# Patient Record
Sex: Female | Born: 1955
Health system: Southern US, Community
[De-identification: ages and names within clinical notes are randomized; demographics above are authoritative.]

## PROBLEM LIST (undated history)

## (undated) DIAGNOSIS — Z87442 Personal history of urinary calculi: Secondary | ICD-10-CM

## (undated) DIAGNOSIS — J849 Interstitial pulmonary disease, unspecified: Secondary | ICD-10-CM

## (undated) DIAGNOSIS — E119 Type 2 diabetes mellitus without complications: Secondary | ICD-10-CM

## (undated) DIAGNOSIS — T7840XA Allergy, unspecified, initial encounter: Secondary | ICD-10-CM

## (undated) DIAGNOSIS — N189 Chronic kidney disease, unspecified: Secondary | ICD-10-CM

## (undated) HISTORY — DX: Type 2 diabetes mellitus without complications: E11.9

## (undated) HISTORY — DX: Allergy, unspecified, initial encounter: T78.40XA

## (undated) HISTORY — PX: TUBAL LIGATION: SHX77

## (undated) HISTORY — PX: DILATION AND CURETTAGE OF UTERUS: SHX78

---

## 1998-04-26 ENCOUNTER — Other Ambulatory Visit: Admission: RE | Admit: 1998-04-26 | Discharge: 1998-04-26 | Payer: Self-pay | Admitting: Gynecology

## 1999-05-07 ENCOUNTER — Encounter (INDEPENDENT_AMBULATORY_CARE_PROVIDER_SITE_OTHER): Payer: Self-pay

## 1999-05-07 ENCOUNTER — Other Ambulatory Visit: Admission: RE | Admit: 1999-05-07 | Discharge: 1999-05-07 | Payer: Self-pay | Admitting: Gynecology

## 1999-05-16 ENCOUNTER — Encounter: Admission: RE | Admit: 1999-05-16 | Discharge: 1999-05-16 | Payer: Self-pay | Admitting: Gynecology

## 1999-05-16 ENCOUNTER — Encounter: Payer: Self-pay | Admitting: Gynecology

## 1999-11-07 ENCOUNTER — Encounter (INDEPENDENT_AMBULATORY_CARE_PROVIDER_SITE_OTHER): Payer: Self-pay | Admitting: Specialist

## 1999-11-07 ENCOUNTER — Other Ambulatory Visit: Admission: RE | Admit: 1999-11-07 | Discharge: 1999-11-07 | Payer: Self-pay | Admitting: Gynecology

## 2000-01-05 ENCOUNTER — Encounter (INDEPENDENT_AMBULATORY_CARE_PROVIDER_SITE_OTHER): Payer: Self-pay | Admitting: Specialist

## 2000-01-05 ENCOUNTER — Other Ambulatory Visit: Admission: RE | Admit: 2000-01-05 | Discharge: 2000-01-05 | Payer: Self-pay | Admitting: Gynecology

## 2000-01-23 HISTORY — PX: FOOT SURGERY: SHX648

## 2000-05-16 ENCOUNTER — Encounter: Admission: RE | Admit: 2000-05-16 | Discharge: 2000-05-16 | Payer: Self-pay | Admitting: Gynecology

## 2000-05-16 ENCOUNTER — Encounter: Payer: Self-pay | Admitting: Gynecology

## 2000-05-24 ENCOUNTER — Encounter: Admission: RE | Admit: 2000-05-24 | Discharge: 2000-05-24 | Payer: Self-pay | Admitting: Gynecology

## 2000-05-24 ENCOUNTER — Encounter: Payer: Self-pay | Admitting: Gynecology

## 2001-02-17 ENCOUNTER — Other Ambulatory Visit: Admission: RE | Admit: 2001-02-17 | Discharge: 2001-02-17 | Payer: Self-pay | Admitting: Gynecology

## 2001-05-27 ENCOUNTER — Encounter: Admission: RE | Admit: 2001-05-27 | Discharge: 2001-05-27 | Payer: Self-pay | Admitting: Gynecology

## 2001-05-27 ENCOUNTER — Encounter: Payer: Self-pay | Admitting: Gynecology

## 2002-02-25 ENCOUNTER — Other Ambulatory Visit: Admission: RE | Admit: 2002-02-25 | Discharge: 2002-02-25 | Payer: Self-pay | Admitting: Gynecology

## 2002-06-05 ENCOUNTER — Encounter: Admission: RE | Admit: 2002-06-05 | Discharge: 2002-06-05 | Payer: Self-pay | Admitting: Gynecology

## 2002-06-05 ENCOUNTER — Encounter: Payer: Self-pay | Admitting: Gynecology

## 2003-03-01 ENCOUNTER — Other Ambulatory Visit: Admission: RE | Admit: 2003-03-01 | Discharge: 2003-03-01 | Payer: Self-pay | Admitting: Gynecology

## 2003-06-29 ENCOUNTER — Ambulatory Visit (HOSPITAL_COMMUNITY): Admission: RE | Admit: 2003-06-29 | Discharge: 2003-06-29 | Payer: Self-pay | Admitting: Gynecology

## 2004-03-09 ENCOUNTER — Other Ambulatory Visit: Admission: RE | Admit: 2004-03-09 | Discharge: 2004-03-09 | Payer: Self-pay | Admitting: Gynecology

## 2004-03-31 ENCOUNTER — Ambulatory Visit: Payer: Self-pay | Admitting: Internal Medicine

## 2004-06-30 ENCOUNTER — Ambulatory Visit (HOSPITAL_COMMUNITY): Admission: RE | Admit: 2004-06-30 | Discharge: 2004-06-30 | Payer: Self-pay | Admitting: Gynecology

## 2004-07-17 ENCOUNTER — Ambulatory Visit: Payer: Self-pay | Admitting: Internal Medicine

## 2004-07-21 ENCOUNTER — Ambulatory Visit: Payer: Self-pay | Admitting: Internal Medicine

## 2004-08-14 ENCOUNTER — Ambulatory Visit: Payer: Self-pay | Admitting: Internal Medicine

## 2005-03-13 ENCOUNTER — Other Ambulatory Visit: Admission: RE | Admit: 2005-03-13 | Discharge: 2005-03-13 | Payer: Self-pay | Admitting: Gynecology

## 2005-07-02 ENCOUNTER — Ambulatory Visit (HOSPITAL_COMMUNITY): Admission: RE | Admit: 2005-07-02 | Discharge: 2005-07-02 | Payer: Self-pay | Admitting: Internal Medicine

## 2005-07-17 ENCOUNTER — Ambulatory Visit: Payer: Self-pay | Admitting: Internal Medicine

## 2005-07-23 ENCOUNTER — Ambulatory Visit: Payer: Self-pay | Admitting: Internal Medicine

## 2005-12-03 ENCOUNTER — Ambulatory Visit: Payer: Self-pay | Admitting: Internal Medicine

## 2005-12-03 LAB — CONVERTED CEMR LAB
Glucose, Bld: 136 mg/dL — ABNORMAL HIGH (ref 70–99)
Hgb A1c MFr Bld: 7 % — ABNORMAL HIGH (ref 4.6–6.0)

## 2006-02-20 ENCOUNTER — Ambulatory Visit: Payer: Self-pay | Admitting: Internal Medicine

## 2006-02-27 ENCOUNTER — Other Ambulatory Visit: Admission: RE | Admit: 2006-02-27 | Discharge: 2006-02-27 | Payer: Self-pay | Admitting: Gynecology

## 2006-05-15 ENCOUNTER — Ambulatory Visit: Payer: Self-pay | Admitting: Internal Medicine

## 2006-07-04 ENCOUNTER — Ambulatory Visit (HOSPITAL_COMMUNITY): Admission: RE | Admit: 2006-07-04 | Discharge: 2006-07-04 | Payer: Self-pay | Admitting: Internal Medicine

## 2006-08-13 ENCOUNTER — Ambulatory Visit: Payer: Self-pay | Admitting: Internal Medicine

## 2006-08-13 LAB — CONVERTED CEMR LAB
ALT: 30 units/L (ref 0–35)
AST: 25 units/L (ref 0–37)
Albumin: 3.4 g/dL — ABNORMAL LOW (ref 3.5–5.2)
Alkaline Phosphatase: 72 units/L (ref 39–117)
BUN: 12 mg/dL (ref 6–23)
Basophils Absolute: 0.1 10*3/uL (ref 0.0–0.1)
Basophils Relative: 1.4 % — ABNORMAL HIGH (ref 0.0–1.0)
Bilirubin Urine: NEGATIVE
Bilirubin, Direct: 0.1 mg/dL (ref 0.0–0.3)
CO2: 28 meq/L (ref 19–32)
Calcium: 9.2 mg/dL (ref 8.4–10.5)
Chloride: 105 meq/L (ref 96–112)
Cholesterol: 99 mg/dL (ref 0–200)
Creatinine, Ser: 0.9 mg/dL (ref 0.4–1.2)
Crystals: NEGATIVE
Eosinophils Absolute: 0.3 10*3/uL (ref 0.0–0.6)
Eosinophils Relative: 5.6 % — ABNORMAL HIGH (ref 0.0–5.0)
GFR calc Af Amer: 86 mL/min
GFR calc non Af Amer: 71 mL/min
Glucose, Bld: 126 mg/dL — ABNORMAL HIGH (ref 70–99)
HCT: 36.1 % (ref 36.0–46.0)
HDL: 46.7 mg/dL (ref >39.0)
Hemoglobin: 12.2 g/dL (ref 12.0–15.0)
Ketones, ur: NEGATIVE mg/dL
LDL Cholesterol: 40 mg/dL (ref 0–99)
Leukocytes, UA: NEGATIVE
Lymphocytes Relative: 43.2 % (ref 12.0–46.0)
MCHC: 33.9 g/dL (ref 30.0–36.0)
MCV: 85.7 fL (ref 78.0–100.0)
Monocytes Absolute: 0.4 10*3/uL (ref 0.2–0.7)
Monocytes Relative: 6.3 % (ref 3.0–11.0)
Mucus, UA: NEGATIVE
Neutro Abs: 2.6 10*3/uL (ref 1.4–7.7)
Neutrophils Relative %: 43.5 % (ref 43.0–77.0)
Nitrite: NEGATIVE
Platelets: 106 10*3/uL — ABNORMAL LOW (ref 150–400)
Potassium: 3.7 meq/L (ref 3.5–5.1)
RBC: 4.21 M/uL (ref 3.87–5.11)
RDW: 13.2 % (ref 11.5–14.6)
Sodium: 138 meq/L (ref 135–145)
Specific Gravity, Urine: >=1.03 (ref 1.000–1.03)
TSH: 3.42 microintl units/mL (ref 0.35–5.50)
Total Bilirubin: 0.5 mg/dL (ref 0.3–1.2)
Total CHOL/HDL Ratio: 2.1
Total Protein, Urine: NEGATIVE mg/dL
Total Protein: 7.5 g/dL (ref 6.0–8.3)
Triglycerides: 60 mg/dL (ref 0–149)
Urine Glucose: NEGATIVE mg/dL
Urobilinogen, UA: 0.2 (ref 0.0–1.0)
VLDL: 12 mg/dL (ref 0–40)
WBC: 6 10*3/uL (ref 4.5–10.5)
pH: 5.5 (ref 5.0–8.0)

## 2006-08-20 ENCOUNTER — Ambulatory Visit: Payer: Self-pay | Admitting: Internal Medicine

## 2007-01-27 ENCOUNTER — Telehealth (INDEPENDENT_AMBULATORY_CARE_PROVIDER_SITE_OTHER): Payer: Self-pay | Admitting: *Deleted

## 2007-02-19 ENCOUNTER — Telehealth (INDEPENDENT_AMBULATORY_CARE_PROVIDER_SITE_OTHER): Payer: Self-pay | Admitting: *Deleted

## 2007-02-19 ENCOUNTER — Ambulatory Visit: Payer: Self-pay | Admitting: Internal Medicine

## 2007-02-23 LAB — CONVERTED CEMR LAB

## 2007-03-10 ENCOUNTER — Other Ambulatory Visit: Admission: RE | Admit: 2007-03-10 | Discharge: 2007-03-10 | Payer: Self-pay | Admitting: Gynecology

## 2007-04-03 ENCOUNTER — Encounter: Payer: Self-pay | Admitting: *Deleted

## 2007-04-03 DIAGNOSIS — E118 Type 2 diabetes mellitus with unspecified complications: Secondary | ICD-10-CM | POA: Insufficient documentation

## 2007-04-03 DIAGNOSIS — Z9189 Other specified personal risk factors, not elsewhere classified: Secondary | ICD-10-CM | POA: Insufficient documentation

## 2007-07-07 ENCOUNTER — Ambulatory Visit (HOSPITAL_COMMUNITY): Admission: RE | Admit: 2007-07-07 | Discharge: 2007-07-07 | Payer: Self-pay | Admitting: Internal Medicine

## 2007-08-13 ENCOUNTER — Ambulatory Visit: Payer: Self-pay | Admitting: Internal Medicine

## 2007-08-13 LAB — CONVERTED CEMR LAB
ALT: 34 units/L (ref 0–35)
AST: 29 units/L (ref 0–37)
Albumin: 3.6 g/dL (ref 3.5–5.2)
Alkaline Phosphatase: 66 units/L (ref 39–117)
BUN: 15 mg/dL (ref 6–23)
Basophils Absolute: 0.1 10*3/uL (ref 0.0–0.1)
Basophils Relative: 1.1 % (ref 0.0–3.0)
Bilirubin Urine: NEGATIVE
Bilirubin, Direct: 0.1 mg/dL (ref 0.0–0.3)
CO2: 28 meq/L (ref 19–32)
Calcium: 9.4 mg/dL (ref 8.4–10.5)
Chloride: 105 meq/L (ref 96–112)
Cholesterol: 95 mg/dL (ref 0–200)
Creatinine, Ser: 0.9 mg/dL (ref 0.4–1.2)
Crystals: NEGATIVE
Eosinophils Absolute: 0.4 10*3/uL (ref 0.0–0.7)
Eosinophils Relative: 6.1 % — ABNORMAL HIGH (ref 0.0–5.0)
GFR calc Af Amer: 86 mL/min
GFR calc non Af Amer: 71 mL/min
Glucose, Bld: 97 mg/dL (ref 70–99)
HCT: 36.2 % (ref 36.0–46.0)
HDL: 51.2 mg/dL (ref >39.0)
Hemoglobin: 12.1 g/dL (ref 12.0–15.0)
Hgb A1c MFr Bld: 7.6 % — ABNORMAL HIGH (ref 4.6–6.0)
Ketones, ur: NEGATIVE mg/dL
LDL Cholesterol: 32 mg/dL (ref 0–99)
Leukocytes, UA: NEGATIVE
Lymphocytes Relative: 49.4 % — ABNORMAL HIGH (ref 12.0–46.0)
MCHC: 33.4 g/dL (ref 30.0–36.0)
MCV: 87.2 fL (ref 78.0–100.0)
Monocytes Absolute: 0.5 10*3/uL (ref 0.1–1.0)
Monocytes Relative: 7.3 % (ref 3.0–12.0)
Mucus, UA: NEGATIVE
Neutro Abs: 2.5 10*3/uL (ref 1.4–7.7)
Neutrophils Relative %: 36.1 % — ABNORMAL LOW (ref 43.0–77.0)
Nitrite: NEGATIVE
Platelets: 203 10*3/uL (ref 150–400)
Potassium: 3.8 meq/L (ref 3.5–5.1)
RBC: 4.15 M/uL (ref 3.87–5.11)
RDW: 13.4 % (ref 11.5–14.6)
Sodium: 145 meq/L (ref 135–145)
Specific Gravity, Urine: 1.025 (ref 1.000–1.03)
TSH: 2.35 microintl units/mL (ref 0.35–5.50)
Total Bilirubin: 0.6 mg/dL (ref 0.3–1.2)
Total CHOL/HDL Ratio: 1.9
Total Protein, Urine: NEGATIVE mg/dL
Total Protein: 8.5 g/dL — ABNORMAL HIGH (ref 6.0–8.3)
Triglycerides: 57 mg/dL (ref 0–149)
Urine Glucose: NEGATIVE mg/dL
Urobilinogen, UA: 0.2 (ref 0.0–1.0)
VLDL: 11 mg/dL (ref 0–40)
WBC: 6.8 10*3/uL (ref 4.5–10.5)
pH: 5.5 (ref 5.0–8.0)

## 2007-08-21 ENCOUNTER — Ambulatory Visit: Payer: Self-pay | Admitting: Internal Medicine

## 2007-08-22 ENCOUNTER — Telehealth: Payer: Self-pay | Admitting: Internal Medicine

## 2007-09-04 ENCOUNTER — Telehealth: Payer: Self-pay | Admitting: Internal Medicine

## 2007-09-10 ENCOUNTER — Telehealth: Payer: Self-pay | Admitting: Internal Medicine

## 2007-09-11 ENCOUNTER — Telehealth: Payer: Self-pay | Admitting: Internal Medicine

## 2008-01-23 HISTORY — PX: COLONOSCOPY: SHX174

## 2008-02-09 ENCOUNTER — Ambulatory Visit: Payer: Self-pay | Admitting: Cardiology

## 2008-02-09 ENCOUNTER — Ambulatory Visit: Payer: Self-pay | Admitting: Internal Medicine

## 2008-02-09 ENCOUNTER — Other Ambulatory Visit: Admission: RE | Admit: 2008-02-09 | Discharge: 2008-02-09 | Payer: Self-pay | Admitting: Gynecology

## 2008-02-09 ENCOUNTER — Telehealth: Payer: Self-pay | Admitting: Internal Medicine

## 2008-02-09 LAB — CONVERTED CEMR LAB
Bilirubin Urine: NEGATIVE
Glucose, Urine, Semiquant: NEGATIVE
Ketones, urine, test strip: NEGATIVE
Nitrite: NEGATIVE
Protein, U semiquant: NEGATIVE
Specific Gravity, Urine: >=1.03
Urobilinogen, UA: 0.2
WBC Urine, dipstick: NEGATIVE
pH: 6

## 2008-02-10 ENCOUNTER — Telehealth: Payer: Self-pay | Admitting: Internal Medicine

## 2008-03-24 ENCOUNTER — Encounter: Payer: Self-pay | Admitting: Internal Medicine

## 2008-07-08 ENCOUNTER — Ambulatory Visit (HOSPITAL_COMMUNITY): Admission: RE | Admit: 2008-07-08 | Discharge: 2008-07-08 | Payer: Self-pay | Admitting: Gynecology

## 2008-08-09 ENCOUNTER — Ambulatory Visit: Payer: Self-pay | Admitting: Internal Medicine

## 2008-08-09 LAB — CONVERTED CEMR LAB
ALT: 29 units/L (ref 0–35)
AST: 23 units/L (ref 0–37)
Albumin: 3.3 g/dL — ABNORMAL LOW (ref 3.5–5.2)
Alkaline Phosphatase: 80 units/L (ref 39–117)
BUN: 15 mg/dL (ref 6–23)
Basophils Absolute: 0 10*3/uL (ref 0.0–0.1)
Basophils Relative: 0.2 % (ref 0.0–3.0)
Bilirubin Urine: NEGATIVE
Bilirubin, Direct: 0.1 mg/dL (ref 0.0–0.3)
CO2: 32 meq/L (ref 19–32)
Calcium: 9.5 mg/dL (ref 8.4–10.5)
Chloride: 105 meq/L (ref 96–112)
Cholesterol: 95 mg/dL (ref 0–200)
Creatinine, Ser: 0.9 mg/dL (ref 0.4–1.2)
Eosinophils Absolute: 0.4 10*3/uL (ref 0.0–0.7)
Eosinophils Relative: 6.4 % — ABNORMAL HIGH (ref 0.0–5.0)
GFR calc non Af Amer: 85.01 mL/min (ref >60)
Glucose, Bld: 113 mg/dL — ABNORMAL HIGH (ref 70–99)
HCT: 36.2 % (ref 36.0–46.0)
HDL: 56 mg/dL (ref >39.00)
Hemoglobin: 12 g/dL (ref 12.0–15.0)
Hgb A1c MFr Bld: 7.1 % — ABNORMAL HIGH (ref 4.6–6.5)
Ketones, ur: NEGATIVE mg/dL
LDL Cholesterol: 30 mg/dL (ref 0–99)
Leukocytes, UA: NEGATIVE
Lymphocytes Relative: 41.3 % (ref 12.0–46.0)
Lymphs Abs: 2.4 10*3/uL (ref 0.7–4.0)
MCHC: 33.2 g/dL (ref 30.0–36.0)
MCV: 85.1 fL (ref 78.0–100.0)
Monocytes Absolute: 0.4 10*3/uL (ref 0.1–1.0)
Monocytes Relative: 6.3 % (ref 3.0–12.0)
Neutro Abs: 2.6 10*3/uL (ref 1.4–7.7)
Neutrophils Relative %: 45.8 % (ref 43.0–77.0)
Nitrite: NEGATIVE
Platelets: 197 10*3/uL (ref 150.0–400.0)
Potassium: 4.3 meq/L (ref 3.5–5.1)
RBC: 4.26 M/uL (ref 3.87–5.11)
RDW: 13.8 % (ref 11.5–14.6)
Sodium: 143 meq/L (ref 135–145)
Specific Gravity, Urine: 1.015 (ref 1.000–1.030)
TSH: 2.37 microintl units/mL (ref 0.35–5.50)
Total Bilirubin: 0.5 mg/dL (ref 0.3–1.2)
Total CHOL/HDL Ratio: 2
Total Protein, Urine: NEGATIVE mg/dL
Total Protein: 8 g/dL (ref 6.0–8.3)
Triglycerides: 44 mg/dL (ref 0.0–149.0)
Urine Glucose: NEGATIVE mg/dL
Urobilinogen, UA: 0.2 (ref 0.0–1.0)
VLDL: 8.8 mg/dL (ref 0.0–40.0)
WBC: 5.8 10*3/uL (ref 4.5–10.5)
pH: 5.5 (ref 5.0–8.0)

## 2008-08-26 ENCOUNTER — Ambulatory Visit: Payer: Self-pay | Admitting: Internal Medicine

## 2008-12-03 ENCOUNTER — Telehealth: Payer: Self-pay | Admitting: Internal Medicine

## 2009-02-22 ENCOUNTER — Encounter: Payer: Self-pay | Admitting: Internal Medicine

## 2009-06-22 LAB — CONVERTED CEMR LAB: Pap Smear: NORMAL

## 2009-09-05 ENCOUNTER — Ambulatory Visit: Payer: Self-pay | Admitting: Internal Medicine

## 2009-09-05 LAB — CONVERTED CEMR LAB
ALT: 32 units/L (ref 0–35)
AST: 27 units/L (ref 0–37)
Albumin: 3.5 g/dL (ref 3.5–5.2)
Alkaline Phosphatase: 83 units/L (ref 39–117)
BUN: 16 mg/dL (ref 6–23)
Basophils Absolute: 0 10*3/uL (ref 0.0–0.1)
Basophils Relative: 0.6 % (ref 0.0–3.0)
Bilirubin Urine: NEGATIVE
Bilirubin, Direct: 0.1 mg/dL (ref 0.0–0.3)
CO2: 30 meq/L (ref 19–32)
Calcium: 9.5 mg/dL (ref 8.4–10.5)
Chloride: 107 meq/L (ref 96–112)
Cholesterol: 103 mg/dL (ref 0–200)
Creatinine, Ser: 1 mg/dL (ref 0.4–1.2)
Eosinophils Absolute: 0.4 10*3/uL (ref 0.0–0.7)
Eosinophils Relative: 6.9 % — ABNORMAL HIGH (ref 0.0–5.0)
GFR calc non Af Amer: 74.95 mL/min (ref >60)
Glucose, Bld: 113 mg/dL — ABNORMAL HIGH (ref 70–99)
HCT: 37 % (ref 36.0–46.0)
HDL: 54.4 mg/dL (ref >39.00)
Hemoglobin: 12.2 g/dL (ref 12.0–15.0)
Ketones, ur: NEGATIVE mg/dL
LDL Cholesterol: 38 mg/dL (ref 0–99)
Leukocytes, UA: NEGATIVE
Lymphocytes Relative: 46.6 % — ABNORMAL HIGH (ref 12.0–46.0)
Lymphs Abs: 3 10*3/uL (ref 0.7–4.0)
MCHC: 33 g/dL (ref 30.0–36.0)
MCV: 87.2 fL (ref 78.0–100.0)
Monocytes Absolute: 0.5 10*3/uL (ref 0.1–1.0)
Monocytes Relative: 8.1 % (ref 3.0–12.0)
Neutro Abs: 2.5 10*3/uL (ref 1.4–7.7)
Neutrophils Relative %: 37.8 % — ABNORMAL LOW (ref 43.0–77.0)
Nitrite: NEGATIVE
Platelets: 214 10*3/uL (ref 150.0–400.0)
Potassium: 4.5 meq/L (ref 3.5–5.1)
RBC: 4.24 M/uL (ref 3.87–5.11)
RDW: 14.6 % (ref 11.5–14.6)
Sodium: 143 meq/L (ref 135–145)
Specific Gravity, Urine: 1.025 (ref 1.000–1.030)
TSH: 2.21 microintl units/mL (ref 0.35–5.50)
Total Bilirubin: 0.3 mg/dL (ref 0.3–1.2)
Total CHOL/HDL Ratio: 2
Total Protein, Urine: NEGATIVE mg/dL
Total Protein: 7.4 g/dL (ref 6.0–8.3)
Triglycerides: 55 mg/dL (ref 0.0–149.0)
Urine Glucose: NEGATIVE mg/dL
Urobilinogen, UA: 0.2 (ref 0.0–1.0)
VLDL: 11 mg/dL (ref 0.0–40.0)
WBC: 6.5 10*3/uL (ref 4.5–10.5)
pH: 5.5 (ref 5.0–8.0)

## 2009-09-16 ENCOUNTER — Encounter: Payer: Self-pay | Admitting: Internal Medicine

## 2009-09-16 ENCOUNTER — Ambulatory Visit: Payer: Self-pay | Admitting: Internal Medicine

## 2009-09-16 LAB — CONVERTED CEMR LAB: Hgb A1c MFr Bld: 7.1 % — ABNORMAL HIGH (ref 4.6–6.5)

## 2009-11-16 ENCOUNTER — Encounter (INDEPENDENT_AMBULATORY_CARE_PROVIDER_SITE_OTHER): Payer: Self-pay | Admitting: *Deleted

## 2009-12-23 ENCOUNTER — Encounter (INDEPENDENT_AMBULATORY_CARE_PROVIDER_SITE_OTHER): Payer: Self-pay | Admitting: *Deleted

## 2009-12-26 ENCOUNTER — Ambulatory Visit: Payer: Self-pay | Admitting: Gastroenterology

## 2010-01-11 ENCOUNTER — Ambulatory Visit: Payer: Self-pay | Admitting: Gastroenterology

## 2010-02-21 NOTE — Progress Notes (Signed)
Summary: Januvia request  Phone Note Call from Patient Call back at Work Phone [phone] Call back at [phone] cell   Caller: Patient Summary of Call: Patient called stating that her glimeperide was D/C (per 09/04/07 phone note). SHe is now requesting a new rx for Januvia that will help curve her appitite Initial call taken by: Rock Nephew CMA,  September 10, 2007 10:03 AM  Follow-up for Phone Call        OK for januvia 100mg  1 by mouth once daily, # 30. If we have samples she can have a sample for trial. Rx e-sent to her pharmacy Follow-up by: Jacques Navy MD,  September 11, 2007 8:36 AM  Additional Follow-up for Phone Call Additional follow up Details #1::        Patient notified and will pick up samples Additional Follow-up by: Rock Nephew CMA,  September 11, 2007 11:15 AM    New/Updated Medications: JANUVIA 100 MG  TABS (SITAGLIPTIN PHOSPHATE) 1 by mouth once daily   Prescriptions: JANUVIA 100 MG  TABS (SITAGLIPTIN PHOSPHATE) 1 by mouth once daily  #30 x 12   Entered and Authorized by:   Jacques Navy MD   Signed by:   Jacques Navy MD on 09/11/2007   Method used:   Electronically sent to ...       [facility] #[id number]*       [address]       [place], [place]  [address]       Ph: [phone]       Fax: [phone]   RxID:   [id number]

## 2010-02-21 NOTE — Letter (Signed)
Summary: Pre Visit Letter Revised  [facility]  [address] [place], [place] [address]   Phone: [phone]  Fax: [phone]        11/16/2009 MRN: [id number] Angel Gordon [address] [place], [place]  [address]             Procedure Date:  01/11/2010   Welcome to the Gastroenterology Division at [facility].    You are scheduled to see a nurse for your pre-procedure visit on 12/26/2009 at 8:00AM on the 3rd floor at [facility], [address]  We ask that you try to arrive at our office 15 minutes prior to your appointment time to allow for check-in.  Please take a minute to review the attached form.  If you answer "Yes" to one or more of the questions on the first page, we ask that you call the person listed at your earliest opportunity.  If you answer "No" to all of the questions, please complete the rest of the form and bring it to your appointment.    Your nurse visit will consist of discussing your medical and surgical history, your immediate family medical history, and your medications.   If you are unable to list all of your medications on the form, please bring the medication bottles to your appointment and we will list them.  We will need to be aware of both prescribed and over the counter drugs.  We will need to know exact dosage information as well.    Please be prepared to read and sign documents such as consent forms, a financial agreement, and acknowledgement forms.  If necessary, and with your consent, a friend or relative is welcome to sit-in on the nurse visit with you.  Please bring your insurance card so that we may make a copy of it.  If your insurance requires a referral to see a specialist, please bring your referral form from your primary care physician.  No co-pay is required for this nurse visit.     If you cannot keep your appointment, please call [phone] to cancel or reschedule prior to your appointment date.  This  allows [place] the opportunity to schedule an appointment for another patient in need of care.    Thank you for choosing [facility] for your medical needs.  We appreciate the opportunity to care for you.  Please visit [place] at our website  to learn more about our practice.  Sincerely, The Gastroenterology Division

## 2010-02-21 NOTE — Progress Notes (Signed)
Summary: Low Cbg's again  Phone Note Call from Patient   Summary of Call: Pt taking med every other day as advised on last phone note. Pt concerned b/c cbgs are still dropping to 50's. Initial call taken by: Lamar Sprinkles,  September 04, 2007 2:35 PM  Follow-up for Phone Call        left msg with SO: stop glimeperide. Follow-up by: Jacques Navy MD,  September 04, 2007 5:42 PM

## 2010-02-21 NOTE — Assessment & Plan Note (Signed)
Summary: CPX/aetna/cd   Vital Signs:  Patient profile:   55 year old female Height:      65 inches Weight:      185.75 pounds BMI:     31.02 O2 Sat:      98 % on Room air Temp:     97.8 degrees F oral Pulse rate:   86 / minute BP sitting:   110 / 84  (right arm) Cuff size:   regular  Vitals Entered By: christy freeman-student  O2 Flow:  Room air CC: physical Is Patient Diabetic? Yes  Pain Assessment Patient in pain? no        Primary Care Provider:  Corrinna Karapetyan  CC:  physical.  History of Present Illness: Patient presents for medical follow-up. She reports that she has GI side effects and metallic taste and fatigue associated with metformin.   She had gyn exam in January - Dr. Chevis Pretty; mammography in July. Eye exam - March '10 Dr. Sherryle Lis - normal.  She is otherwise feeling well.   Current Medications (verified): 1)  Metformin Hcl 500 Mg Tabs (Metformin Hcl) .Marland Kitchen.. 1 By Mouth Two Times A Day 2)  Onetouch Ultra Test  Strp (Glucose Blood) .... Check Blood Sugar Levels Two Times A Day  Allergies (verified): No Known Drug Allergies  Past History:  Past Medical History: DIABETES MELLITUS (ICD-250.00)     Past Surgical History: Reviewed history from 04/03/2007 and no changes required. FOOT SURGERY, HX OF (ICD-V15.89) Hx of TUBAL LIGATION, HX OF (ICD-V26.51)  Family History: Reviewed history from 08/21/2007 and no changes required. father -deceased @33 : MVA mother - 50: HTN, DM, ESRD s/p transplant, lymphoma, CVA, thyroid Neg- breast cancer MGM - colon cancer  Social History: Reviewed history from 08/21/2007 and no changes required. [employer], [employer] - classes married '78 - 3 years, divorced; married '96 1 son ' '79; 3 grandchildren work: [place]  Review of Systems  The patient denies anorexia, fever, weight loss, weight gain, vision loss, hoarseness, chest pain, peripheral edema, abdominal pain, severe indigestion/heartburn, incontinence, depression, and enlarged  lymph nodes.         Having urinary urgency.  Physical Exam  General:  overweight AA female in no distress Head:  Normocephalic and atraumatic without obvious abnormalities. No apparent alopecia or balding. Eyes:  C&S clear  Ears:  External ear exam shows no significant lesions or deformities.  Otoscopic examination reveals clear canals, tympanic membranes are intact bilaterally without bulging, retraction, inflammation or discharge. Hearing is grossly normal bilaterally. Nose:  no external deformity and no external erythema.   Mouth:  Oral mucosa and oropharynx without lesions or exudates.  Teeth in good repair. Neck:  No deformities, masses, or tenderness noted.no thyromegaly.   Breasts:  deferred to gyn  Lungs:  Normal respiratory effort, chest expands symmetrically. Lungs are clear to auscultation, no crackles or wheezes. Heart:  Normal rate and regular rhythm. S1 and S2 normal without gallop, murmur, click, rub or other extra sounds. Abdomen:  soft, non-tender, and normal bowel sounds.   Genitalia:  deferred to gyn  Msk:  normal ROM, no joint tenderness, no joint swelling, and no joint warmth.   Pulses:  2+ radial  Neurologic:  alert & oriented X3, cranial nerves II-XII intact, strength normal in all extremities, and gait normal.   Skin:  turgor normal, color normal, no ulcerations, and no edema.   Cervical Nodes:  no anterior cervical adenopathy and no posterior cervical adenopathy.   Psych:  Oriented X3, memory intact  for recent and remote, normally interactive, and good eye contact.    Diabetes Management Exam:    Eye Exam:       Eye Exam done elsewhere          Date: 03/31/2008          Results: normal          Done by: Dr. Sherryle Lis   Impression & Recommendations:  Problem # 1:  DIABETES MELLITUS (ICD-250.00)  Her updated medication list for this problem includes:    Metformin Hcl 500 Mg Tabs (Metformin hcl) .Marland Kitchen... 1 by mouth two times a day  Labs Reviewed: Creat: 0.9  (08/09/2008)     Last Eye Exam: normal (03/31/2008) Reviewed HgBA1c results: 7.1 (08/09/2008)  7.6 (08/13/2007)  Good control. Patient is having side affects. We reviewed alternative meds and costs. At this time we will continue metformin  Problem # 2:  Preventive Health Care (ICD-V70.0) current with gyn, mammography and with opthalmology. Her exam is normal. labs are good.  In summargy: a very nice woman with well controlled diabetes. She will return as needed.   Complete Medication List: 1)  Metformin Hcl 500 Mg Tabs (Metformin hcl) .Marland Kitchen.. 1 by mouth two times a day 2)  Onetouch Ultra Test Strp (Glucose blood) .... Check blood sugar levels two times a day    Tests: (1) Lipid Panel (LIPID)   Cholesterol               95 mg/dL                    9-629     ATP III Classification            Desirable:  < 200 mg/dL                    Borderline High:  200 - 239 mg/dL               High:  > = 240 mg/dL   Triglycerides             44.0 mg/dL                  5.2-841.3     Normal:  <150 mg/dL     Borderline High:  244 - 199 mg/dL   HDL                       01.02 mg/dL                 >72.53   VLDL Cholesterol          8.8 mg/dL                   6.6-44.0   LDL Cholesterol           30 mg/dL                    3-47  CHO/HDL Ratio:  CHD Risk                             2                    Men          Women     1/2 Average Risk     3.4          3.3  Average Risk          5.0          4.4     2X Average Risk          9.6          7.1     3X Average Risk          15.0          11.0                           Tests: (2) BMP (METABOL)   Sodium                    143 mEq/L                   135-145   Potassium                 4.3 mEq/L                   3.5-5.1   Chloride                  105 mEq/L                   96-112   Carbon Dioxide            32 mEq/L                    19-32   Glucose              [H]  113 mg/dL                   60-45   BUN                       15 mg/dL                     4-09   Creatinine                0.9 mg/dL                   8.1-1.9   Calcium                   9.5 mg/dL                   1.4-78.2   GFR                       85.01 mL/min                >60  Tests: (3) CBC Platelet w/Diff (CBCD)   White Cell Count          5.8 K/uL                    4.5-10.5   Red Cell Count            4.26 Mil/uL                 3.87-5.11   Hemoglobin                12.0 g/dL                   95.6-21.3   Hematocrit  36.2 %                      36.0-46.0   MCV                       85.1 fl                     78.0-100.0   MCHC                      33.2 g/dL                   65.7-84.6   RDW                       13.8 %                      11.5-14.6   Platelet Count            197.0 K/uL                  150.0-400.0   Neutrophil %              45.8 %                      43.0-77.0   Lymphocyte %              41.3 %                      12.0-46.0   Monocyte %                6.3 %                       3.0-12.0   Eosinophils%         [H]  6.4 %                       0.0-5.0   Basophils %               0.2 %                       0.0-3.0   Neutrophill Absolute      2.6 K/uL                    1.4-7.7   Lymphocyte Absolute       2.4 K/uL                    0.7-4.0   Monocyte Absolute         0.4 K/uL                    0.1-1.0  Eosinophils, Absolute                             0.4 K/uL                    0.0-0.7   Basophils Absolute        0.0 K/uL                    0.0-0.1  Tests: (4) Hepatic/Liver Function Panel (HEPATIC)  Total Bilirubin           0.5 mg/dL                   1.6-1.0   Direct Bilirubin          0.1 mg/dL                   9.6-0.4   Alkaline Phosphatase      80 U/L                      39-117   AST                       23 U/L                      0-37   ALT                       29 U/L                      0-35   Total Protein             8.0 g/dL                    5.4-0.9   Albumin              [L]  3.3 g/dL                     8.1-1.9  Tests: (5) TSH (TSH)   FastTSH                   2.37 uIU/mL                 0.35-5.50  Tests: (6) UDip w/Micro (URINE)   Color                     LT. YELLOW       RANGE:  Yellow;Lt. Yellow   Clarity                   CLEAR                       Clear   Specific Gravity          1.015                       1.000 - 1.030   Urine Ph                  5.5                         5.0-8.0   Protein                   NEGATIVE                    Negative   Urine Glucose             NEGATIVE                    Negative   Ketones                   NEGATIVE  Negative   Urine Bilirubin           NEGATIVE                    Negative   Blood                     SMALL                       Negative   Urobilinogen              0.2                         0.0 - 1.0   Leukocyte Esterace        NEGATIVE                    Negative   Nitrite                   NEGATIVE                    Negative   Urine WBC                 0-2/hpf                     0-2/hpf   Urine RBC                 3-6/hpf                     0-2/hpf   Urine Epith               Few(5-10/hpf)               Rare(0-4/hpf)   Urine Bacteria            Few(10-50/hpf)              None  Tests: (7) Hemoglobin A1C (A1C)   Hemoglobin A1C       [H]  7.1 %                       4.6-6.5     Glycemic Control Guidelines for People with Diabetes:     Non Diabetic:  <6%     Goal of Therapy: <7%     Additional Action Suggested:  >8%

## 2010-02-21 NOTE — Progress Notes (Signed)
Summary: PATCHES  Phone Note Call from Patient   Summary of Call: Patient is requesting rx for motion sickness patches for cruise.  Initial call taken by: Lamar Sprinkles, CMA,  December 03, 2008 2:25 PM  Follow-up for Phone Call        OK for scopolamine patches box of 3: apply q 72 hours Follow-up by: Jacques Navy MD,  December 03, 2008 3:04 PM  Additional Follow-up for Phone Call Additional follow up Details #1::        Pt informed  Additional Follow-up by: Lamar Sprinkles, CMA,  December 06, 2008 9:10 AM    New/Updated Medications: TRANSDERM-SCOP 1.5 MG PT72 (SCOPOLAMINE BASE) 1 every 72 hours Prescriptions: TRANSDERM-SCOP 1.5 MG PT72 (SCOPOLAMINE BASE) 1 every 72 hours  #3 x 0   Entered by:   Lamar Sprinkles, CMA   Authorized by:   Jacques Navy MD   Signed by:   Lamar Sprinkles, CMA on 12/06/2008   Method used:   Electronically to        [facility] #[id number]* (retail)       [address] [place], [place]  [address]       Ph: [phone]       Fax: [phone]   RxID:   [id number]

## 2010-02-21 NOTE — Progress Notes (Signed)
Summary: New Med  Phone Note Call from Patient   Caller: [phone] Summary of Call: Pt took new med 1/2 tab yesterday & today. Says bp today was 64.  Initial call taken by: Lamar Sprinkles,  August 22, 2007 2:43 PM  Follow-up for Phone Call        Spoke w/pt, she ate at 2:15 and checked cbg at 3:13, was, 215. Says feels somewhat better, but just "doesn't feel right" Follow-up by: Lamar Sprinkles,  August 22, 2007 3:14 PM  Additional Follow-up for Phone Call Additional follow up Details #1::        can take every other day for now. eat regularly scheduled meals and a bedtime snack. Additional Follow-up by: Jacques Navy MD,  August 22, 2007 6:33 PM    Additional Follow-up for Phone Call Additional follow up Details #2::    Lf mess for pt Follow-up by: Lamar Sprinkles,  August 22, 2007 6:41 PM  Additional Follow-up for Phone Call Additional follow up Details #3:: Details for Additional Follow-up Action Taken: Pt did recieve mess Additional Follow-up by: Lamar Sprinkles,  August 25, 2007 4:30 PM

## 2010-02-21 NOTE — Assessment & Plan Note (Signed)
Summary: CONGESTION/PER FLAG FROM TRIAGE/NWS   Vital Signs:  Patient Profile:   55 Years Old Female Height:     65 inches Weight:      190 pounds Temp:     97.0 degrees F oral Pulse rate:   81 / minute BP sitting:   133 / 85  (left arm) Cuff size:   large  Vitals Entered By: Zackery Barefoot CMA (February 19, 2007 3:37 PM)                 PCP:  Najae Rathert  Chief Complaint:  Cold & URI symptoms/Cough.  History of Present Illness: Since Friday, congestion, feverish, sweats, coughing but not productive. Not SOB.        Physical Exam  General:     alert, well-developed, well-nourished, well-hydrated, appropriate dress, and overweight-appearing.   Head:     normocephalic, atraumatic, no abnormalities observed, and no abnormalities palpated.   Eyes:     pupils equal and pupils round.   Ears:     External ear exam shows no significant lesions or deformities.  Otoscopic examination reveals clear canals, tympanic membranes are intact bilaterally without bulging, retraction, inflammation or discharge. Hearing is grossly normal bilaterally. Mouth:     post-pharynx hard to see. Qestion-erythema Lungs:     Normal respiratory effort, chest expands symmetrically. Lungs are clear to auscultation, no crackles or wheezes. Heart:     Normal rate and regular rhythm. S1 and S2 normal without gallop, murmur, click, rub or other extra sounds.    Impression & Recommendations:  Problem # 1:  URI (ICD-465.9) typical symptoms: viral vs bacterial.  Plan: z-pak, phen/cod, claritin-D Her updated medication list for this problem includes:    Promethazine-codeine 6.25-10 Mg/57ml Syrp (Promethazine-codeine) .Marland Kitchen... 1 tsp q 6 as needed cough   Complete Medication List: 1)  Azithromycin 250 Mg Tabs (Azithromycin) .... 2 tabs day #1, 1 tab days #2-5 2)  Promethazine-codeine 6.25-10 Mg/34ml Syrp (Promethazine-codeine) .Marland Kitchen.. 1 tsp q 6 as needed cough   Patient Instructions: 1)  Upper respiratory  infection: 2)  generic Z-pak, phenergan with codeine cough syrup 1 tsp every 6 hours as needed, claritin D or equivalent.    Prescriptions: PROMETHAZINE-CODEINE 6.25-10 MG/5ML  SYRP (PROMETHAZINE-CODEINE) 1 tsp q 6 as needed cough  #8 oz x 1   Entered and Authorized by:   Jacques Navy MD   Signed by:   Jacques Navy MD on 02/19/2007   Method used:   Print then Give to Patient   RxID:   [id number] AZITHROMYCIN 250 MG  TABS (AZITHROMYCIN) 2 tabs day #1, 1 tab days #2-5  #6 x 0   Entered and Authorized by:   Jacques Navy MD   Signed by:   Jacques Navy MD on 02/19/2007   Method used:   Print then Give to Patient   RxID:   [id number]  ]

## 2010-02-21 NOTE — Miscellaneous (Signed)
Summary: previsit prep/rm  Clinical Lists Changes  Medications: Added new medication of MOVIPREP 100 GM  SOLR (PEG-KCL-NACL-NASULF-NA ASC-C) As per prep instructions. - Signed Rx of MOVIPREP 100 GM  SOLR (PEG-KCL-NACL-NASULF-NA ASC-C) As per prep instructions.;  #1 x 0;  Signed;  Entered by: Sherren Kerns RN;  Authorized by: Louis Meckel MD;  Method used: Electronically to [facility] #[id number]*, [address] [place], [place]  [address], Ph: [phone], Fax: [phone] Observations: Added new observation of ALLERGY REV: Done (12/26/2009 7:43)    Prescriptions: MOVIPREP 100 GM  SOLR (PEG-KCL-NACL-NASULF-NA ASC-C) As per prep instructions.  #1 x 0   Entered by:   Sherren Kerns RN   Authorized by:   Louis Meckel MD   Signed by:   Sherren Kerns RN on 12/26/2009   Method used:   Electronically to        [facility] #[id number]* (retail)       [address] [place], [place]  [address]       Ph: [phone]       Fax: [phone]   RxID:   [id number]

## 2010-02-21 NOTE — Progress Notes (Signed)
Summary: Evidence  Phone Note From Other Clinic Call back at 258   Caller: Rose from [place] Summary of Call: Rose from [place] wants to know what evidence supports Dr. Debby Bud knowing pt has a stone. Requesting same to give to Radiologist. Initial call taken by: Zackery Barefoot CMA,  February 09, 2008 3:03 PM  Follow-up for Phone Call        called rose and gave information Follow-up by: Jacques Navy MD,  February 10, 2008 5:19 AM

## 2010-02-21 NOTE — Assessment & Plan Note (Signed)
Summary: left thigh pain/$50/cd   Vital Signs:  Patient Profile:   55 Years Old Female Height:     65 inches Weight:      186 pounds Temp:     97.7 degrees F oral Pulse rate:   68 / minute BP sitting:   122 / 84  (left arm) Cuff size:   regular  Vitals Entered By: Zackery Barefoot CMA (February 09, 2008 1:20 PM)                 PCP:  Tag Wurtz  Chief Complaint:  Back Pain/urinary urgency and frequency.  History of Present Illness: Patient reeported on-set of severe pain left back which has migrated to the left lower abdomen to the left lower abdominal region. She did see her  gyn today and has had blood drawn to check for menopause. She had a dipstick U/A that was negative except for being strongly positive for blood.   She continues to have a dry cough.  She has persistent foot pain.  She is concerned about her blood sugar - glimiperide was  stopped due to low blood sugars.     Prior Medications Reviewed Using: Patient Recall  Prior Medication List:  JANUVIA 100 MG  TABS (SITAGLIPTIN PHOSPHATE) 1 by mouth once daily   Current Allergies (reviewed today): No known allergies   Past Medical History:    Reviewed history from 04/03/2007 and no changes required:       DIABETES MELLITUS (ICD-250.00)       URI (ICD-465.9)          Past Surgical History:    Reviewed history from 04/03/2007 and no changes required:       FOOT SURGERY, HX OF (ICD-V15.89)       Hx of TUBAL LIGATION, HX OF (ICD-V26.51)     Review of Systems       The patient complains of prolonged cough.  The patient denies anorexia, fever, weight loss, weight gain, chest pain, peripheral edema, abdominal pain, hematuria, muscle weakness, difficulty walking, and depression.     Physical Exam  General:     overweight AA female NAD Head:     Normocephalic and atraumatic without obvious abnormalities. No apparent alopecia or balding. Lungs:     normal respiratory effort and normal breath sounds.    Heart:     normal rate and regular rhythm.   Abdomen:     BS+, tender to palpation inthe LLQ, no guading or rebound. Msk:     no CVAT, no tenderness to palpation over the flanks, normal movement.  Right foot examined: no abnormality or lesion or source of tenderness.    Impression & Recommendations:  Problem # 1:  NEPHROLITHIASIS (ICD-592.0) By patients history of acute and severe pain, with migrationof pain to abdomen and LLQ along with 3+ hematuria suspect nephrolithiasis. On U/A no evidence of infection, normal gyn exam prior to coming to the office.  Plan: CT abd/pelvis r/o obstructing stone.  Orders: Radiology Referral (Radiology)  Addendum: CT scan negative. Nephrolithiasis remains leading diagnosis with spontanous passage of stone.  Plan: as pain resolves no further intervention.   Problem # 2:  DIABETES MELLITUS (ICD-250.00) Patient took [place] sporadically and never filled Rx due to expense. She has had poor control on glimiperide with episodes of hypoglycemia.  Plan: D/C glimiperide.         Start metformin 500mg  once daily x 5 and then two times a day. F/U CBG's with A1C in 3 months.  Her updated medication list for this problem includes:    Metformin Hcl 500 Mg Tabs (Metformin hcl) .Marland Kitchen... 1 by mouth two times a day   Problem # 3:  COUGH (ICD-786.2) Patient with a persistent non-productive cough. No fever or other reports of symptoms to suggest infection. Suspect silen reflux vs cyclical cough.  Plan: otc antitussive with DM         Trial of PPI- priolosec OTC or generic equivalent.  Complete Medication List: 1)  Metformin Hcl 500 Mg Tabs (Metformin hcl) .Marland Kitchen.. 1 by mouth two times a day  Other Orders: UA Dipstick W/ Micro (manual) (71696)   Patient Instructions: 1)  back pain - sounds like a kidiney stone that is now stopped at the pelvic junction on the left. there was a lot of blood in the urine. Plan: CT kidney stone protocol today at 3:00 PM [facility], [address]. 2)  Diabetes - will change to metformin 500mg : take once a day for 5 days and then twice a day. Will check blood sugar in 3 months. Stop the glmiperide and the [place]. 3)  foot pain - foot looks OK and I don't see a source of pain: no infection or wound 4)  cough - this may be a cough related to silent reflux. Plan - take any overt the counter cough syrup with DM. Also, take Prilosec OTC or the generic omeprazole every morning to control acid reflux.    Prescriptions: METFORMIN HCL 500 MG TABS (METFORMIN HCL) 1 by mouth two times a day  #60 x 12   Entered and Authorized by:   Jacques Navy MD   Signed by:   Jacques Navy MD on 02/09/2008   Method used:   Electronically to        [facility] [address] #[id number]* (retail)       [address] [place], [place]  [address]       Ph: [phone]       Fax: [phone]   RxID:   [id number]   Laboratory Results   Urine Tests    Routine Urinalysis   Glucose: negative   (Normal Range: Negative) Bilirubin: negative   (Normal Range: Negative) Ketone: negative   (Normal Range: Negative) Spec. Gravity: >=1.030   (Normal Range: 1.003-1.035) Blood: moderate   (Normal Range: Negative) pH: 6.0   (Normal Range: 5.0-8.0) Protein: negative   (Normal Range: Negative) Urobilinogen: 0.2   (Normal Range: 0-1) Nitrite: negative   (Normal Range: Negative) Leukocyte Esterace: negative   (Normal Range: Negative)

## 2010-02-21 NOTE — Assessment & Plan Note (Signed)
Summary: CPX/#/AETNA/CD   Vital Signs:  Patient profile:   55 year old female Height:      65 inches Weight:      182 pounds BMI:     30.40 O2 Sat:      97 % on Room air Temp:     98.2 degrees F oral Pulse rate:   72 / minute BP sitting:   114 / 80  (left arm) Cuff size:   regular  Vitals Entered By: Bill Salinas CMA (September 16, 2009 2:30 PM)  O2 Flow:  Room air CC: CPX/ ab   Primary Care Provider:  Kenisha Lynds  CC:  CPX/ ab.  History of Present Illness: Patient presents for routine medical exam. she is current with gynecology and does not need Pelvic/PAP or breast exam. she is feeling well . She has had no intercurrent medical illness, surgery or injury. She voice no medical complaints.  Allergies: No Known Drug Allergies  Past History:  Past Medical History: Last updated: 08/26/2008 DIABETES MELLITUS (ICD-250.00)     Past Surgical History: Last updated: 04/03/2007 FOOT SURGERY, HX OF (ICD-V15.89) Hx of TUBAL LIGATION, HX OF (ICD-V26.51)  Family History: Last updated: 09-04-07 father -deceased @35 : MVA mother - 39: HTN, DM, ESRD s/p transplant, lymphoma, CVA, thyroid Neg- breast cancer MGM - colon cancer  Social History: Last updated: 2007/09/04 [employer], [employer] - classes married '78 - 3 years, divorced; married '96 1 son ' '79; 3 grandchildren work: [place]  Risk Factors: Caffeine Use: 1 (04-Sep-2007) Exercise: yes (2007-09-04)  Risk Factors: Smoking Status: never (04/03/2007)  Review of Systems  The patient denies anorexia, fever, weight loss, weight gain, decreased hearing, hoarseness, chest pain, dyspnea on exertion, peripheral edema, prolonged cough, abdominal pain, melena, hematochezia, severe indigestion/heartburn, incontinence, muscle weakness, suspicious skin lesions, difficulty walking, depression, abnormal bleeding, angioedema, and breast masses.    Physical Exam  General:  Overweight AA female in no distress. Head:  Normocephalic and  atraumatic without obvious abnormalities. No apparent alopecia or balding. Eyes:  No corneal or conjunctival inflammation noted. EOMI. Perrla. Funduscopic exam benign, without hemorrhages, exudates or papilledema. Vision grossly normal. Ears:  External ear exam shows no significant lesions or deformities.  Otoscopic examination reveals clear canals, tympanic membranes are intact bilaterally without bulging, retraction, inflammation or discharge. Hearing is grossly normal bilaterally. Nose:  no external deformity and no external erythema.   Mouth:  Oral mucosa and oropharynx without lesions or exudates.  Teeth in good repair. Neck:  supple, full ROM, no thyromegaly, and no carotid bruits.   Chest Wall:  No deformities, masses, or tenderness noted. Breasts:  deferred to gyn Lungs:  Normal respiratory effort, chest expands symmetrically. Lungs are clear to auscultation, no crackles or wheezes. Heart:  Normal rate and regular rhythm. S1 and S2 normal without gallop, murmur, click, rub or other extra sounds. Abdomen:  soft, non-tender, normal bowel sounds, no guarding, and no hepatomegaly.   Genitalia:  deferred to gyn Msk:  normal ROM, no joint tenderness, no joint swelling, no joint warmth, and no redness over joints.   Pulses:  2+ radial and DP pulses Extremities:  No clubbing, cyanosis, edema, or deformity noted with normal full range of motion of all joints.   Neurologic:  alert & oriented X3, cranial nerves II-XII intact, strength normal in all extremities, gait normal, and DTRs symmetrical and normal.   Skin:  turgor normal, color normal, no rashes, and no suspicious lesions.   Cervical Nodes:  no anterior cervical adenopathy and  no posterior cervical adenopathy.   Psych:  Oriented X3, memory intact for recent and remote, normally interactive, good eye contact, and not anxious appearing.    Diabetes Management Exam:    Foot Exam (with socks and/or shoes not present):        Sensory-Pinprick/Light touch:          Left medial foot (L-4): normal          Left dorsal foot (L-5): normal          Left lateral foot (S-1): normal          Right medial foot (L-4): normal          Right dorsal foot (L-5): normal          Right lateral foot (S-1): normal       Sensory-other: normal deep vibratory sensation bilterally       Inspection:          Left foot: normal          Right foot: normal       Nails:          Left foot: normal          Right foot: normal    Eye Exam:       Eye Exam done elsewhere          Date: 01/26/2009          Results: normal          Done by: Dr Almon Register   Impression & Recommendations:  Problem # 1:  DIABETES MELLITUS (ICD-250.00) Patient is conscientious about taking her medications and works hard on a good diet.  Plan - needs A1C  Her updated medication list for this problem includes:    Metformin Hcl 500 Mg Tabs (Metformin hcl) .Marland Kitchen... 1 by mouth two times a day  Orders: TLB-A1C / Hgb A1C (Glycohemoglobin) (83036-A1C) EKG w/ Interpretation (93000)  Addendum - A1V 7.1% - good control  Problem # 2:  Preventive Health Care (ICD-V70.0) Normal history. Normal limited exam. Lab results are within normal limits with an excellent LDL cholesterol at 38. She is current with her gynecologist She is due for colonsocopy for colorectal cancer screening. Screening EKG done in diabetic patient - normal study.  In summary - a very nice woman who appears to medically stable. She is provided Rx for scopolamine patch for up-coming cruise. She will return in 6 months for routine diabetic follow-up.  Complete Medication List: 1)  Metformin Hcl 500 Mg Tabs (Metformin hcl) .Marland Kitchen.. 1 by mouth two times a day 2)  Onetouch Ultra Test Strp (Glucose blood) .... Check blood sugar levels two times a day 3)  Transderm-scop 1.5 Mg Pt72 (Scopolamine base) .Marland Kitchen.. 1 every 72 hours  Other Orders: Gastroenterology Referral (GI)   Patient: Angel Gordon Note: All  result statuses are Final unless otherwise noted.  Tests: (1) Lipid Panel (LIPID)   Cholesterol               103 mg/dL                   1-610     ATP III Classification            Desirable:  < 200 mg/dL                    Borderline High:  200 - 239 mg/dL  High:  > = 240 mg/dL   Triglycerides             55.0 mg/dL                  9.5-188.4     Normal:  <150 mg/dL     Borderline High:  166 - 199 mg/dL   HDL                       06.30 mg/dL                 >16.01   VLDL Cholesterol          11.0 mg/dL                  0.9-32.3   LDL Cholesterol           38 mg/dL                    5-57  CHO/HDL Ratio:  CHD Risk                             2                    Men          Women     1/2 Average Risk     3.4          3.3     Average Risk          5.0          4.4     2X Average Risk          9.6          7.1     3X Average Risk          15.0          11.0                           Tests: (2) BMP (METABOL)   Sodium                    143 mEq/L                   135-145   Potassium                 4.5 mEq/L                   3.5-5.1   Chloride                  107 mEq/L                   96-112   Carbon Dioxide            30 mEq/L                    19-32   Glucose              [H]  113 mg/dL                   32-20   BUN                       16 mg/dL  6-23   Creatinine                1.0 mg/dL                   6.0-4.5   Calcium                   9.5 mg/dL                   4.0-98.1   GFR                       74.95 mL/min                >60  Tests: (3) CBC Platelet w/Diff (CBCD)   White Cell Count          6.5 K/uL                    4.5-10.5   Red Cell Count            4.24 Mil/uL                 3.87-5.11   Hemoglobin                12.2 g/dL                   19.1-47.8   Hematocrit                37.0 %                      36.0-46.0   MCV                       87.2 fl                     78.0-100.0   MCHC                      33.0 g/dL                    29.5-62.1   RDW                       14.6 %                      11.5-14.6   Platelet Count            214.0 K/uL                  150.0-400.0   Neutrophil %         [L]  37.8 %                      43.0-77.0   Lymphocyte %         [H]  46.6 %                      12.0-46.0   Monocyte %                8.1 %                       3.0-12.0   Eosinophils%         [H]  6.9 %  0.0-5.0   Basophils %               0.6 %                       0.0-3.0   Neutrophill Absolute      2.5 K/uL                    1.4-7.7   Lymphocyte Absolute       3.0 K/uL                    0.7-4.0   Monocyte Absolute         0.5 K/uL                    0.1-1.0  Eosinophils, Absolute                             0.4 K/uL                    0.0-0.7   Basophils Absolute        0.0 K/uL                    0.0-0.1  Tests: (4) Hepatic/Liver Function Panel (HEPATIC)   Total Bilirubin           0.3 mg/dL                   1.6-1.0   Direct Bilirubin          0.1 mg/dL                   9.6-0.4   Alkaline Phosphatase      83 U/L                      39-117   AST                       27 U/L                      0-37   ALT                       32 U/L                      0-35   Total Protein             7.4 g/dL                    5.4-0.9   Albumin                   3.5 g/dL                    8.1-1.9  Tests: (5) TSH (TSH)   FastTSH                   2.21 uIU/mL                 0.35-5.50  Tests: (6) UDip w/Micro (URINE)   Color                     Lt. Yellow       RANGE:  Yellow;Lt.  Yellow   Clarity                   Clear                       Clear   Specific Gravity          1.025                       1.000 - 1.030   Urine Ph                  5.5                         5.0-8.0   Protein                   Negative                    Negative   Urine Glucose             Negative                    Negative   Ketones                   Negative                    Negative   Urine  Bilirubin           Negative                    Negative   Blood                     Small                       Negative   Urobilinogen              0.2 mg/dL                   0.0 - 1.0   Leukocyte Esterace        Negative                    Negative   Nitrite                   Negative                    Negative   Urine WBC                 0-2/hpf                     0-2/hpf   Urine RBC                 0-2/hpf                     0-2/hpf   Urine Mucus               Presence of                 None   Urine Epith               Rare(0-4/hpf)  Rare(0-4/hpf)   Urine Bacteria            Few(10-50/hpf)              None Tests: (1) Hemoglobin A1C (A1C)   Hemoglobin A1C       [H]  7.1 %                       4.6-6.5     Glycemic Control Guidelines for People with Diabetes:     Non Diabetic:  <6%     Goal of Therapy: <7%     Additional Action Suggested:  >8%Prescriptions: TRANSDERM-SCOP 1.5 MG PT72 (SCOPOLAMINE BASE) 1 every 72 hours  #3 x 0   Entered and Authorized by:   Jacques Navy MD   Signed by:   Jacques Navy MD on 09/16/2009   Method used:   Electronically to        [facility] [address] #[id number]* (retail)       [address] [place], [place]  [address]       Ph: [phone]       Fax: [phone]   RxID:   [id number]    Preventive Care Screening  Last Flu Shot:    Date:  09/16/2009    Results:  Declined  Last Tetanus Booster:    Date:  09/16/2009    Results:  Declined  Pap Smear:    Date:  06/22/2009    Results:  normal

## 2010-02-21 NOTE — Progress Notes (Signed)
Summary: work in [employer] Note Call from Patient Call back at Work Phone [phone]   Caller: Patient Call For: Norins Summary of Call: Patient called c/o  congestion, headache, cough and low grade fever since saturday 02/15/07. She has tried  theraflu, tolenol cold plus and alkasetzer plus with no relief. Patient is requesting a workin for this afternoon. Per Dr Debby Bud, ok to work patient in today> Call sent to scheduling to add on Initial call taken by: Rock Nephew CMA,  February 19, 2007 11:00 AM  Follow-up for Phone Call        PT. HAS AN APPT THIS AFTERNOON WITH DR Debby Bud Follow-up by: Hilarie Fredrickson,  February 19, 2007 1:52 PM

## 2010-02-21 NOTE — Letter (Signed)
Summary: Moviprep Instructions  [facility]  [address]   [place], [place] [address]   Phone: [phone]  Fax: [phone]       Angel Gordon    December 14, 1955    MRN: [id number]        Procedure Day /Date: Wednesday, 01-11-10     Arrival Time: 8:00 a.m.     Procedure Time: 9:00 a.m.     Location of Procedure:                    x   [facility] (4th Floor)   PREPARATION FOR COLONOSCOPY WITH MOVIPREP   Starting 5 days prior to your procedure 01-06-10  do not eat nuts, seeds, popcorn, corn, beans, peas,  salads, or any raw vegetables.  Do not take any fiber supplements (e.g. Metamucil, Citrucel, and Benefiber).  THE DAY BEFORE YOUR PROCEDURE         DATE: 01-10-10   DAY: Tuesday  1.  Drink clear liquids the entire day-NO SOLID FOOD  2.  Do not drink anything colored red or purple.  Avoid juices with pulp.  No orange juice.  3.  Drink at least 64 oz. (8 glasses) of fluid/clear liquids during the day to prevent dehydration and help the prep work efficiently.  CLEAR LIQUIDS INCLUDE: Water Jello Ice Popsicles Tea (sugar ok, no milk/cream) Powdered fruit flavored drinks Coffee (sugar ok, no milk/cream) Gatorade Juice: apple, white grape, white cranberry  Lemonade Clear bullion, consomm, broth Carbonated beverages (any kind) Strained chicken noodle soup Hard Candy                             4.  In the morning, mix first dose of MoviPrep solution:    Empty 1 Pouch A and 1 Pouch B into the disposable container    Add lukewarm drinking water to the top line of the container. Mix to dissolve    Refrigerate (mixed solution should be used within 24 hrs)  5.  Begin drinking the prep at 5:00 p.m. The MoviPrep container is divided by 4 marks.   Every 15 minutes drink the solution down to the next mark (approximately 8 oz) until the full liter is complete.   6.  Follow completed prep with 16 oz of clear liquid of your choice (Nothing red or  purple).  Continue to drink clear liquids until bedtime.  7.  Before going to bed, mix second dose of MoviPrep solution:    Empty 1 Pouch A and 1 Pouch B into the disposable container    Add lukewarm drinking water to the top line of the container. Mix to dissolve    Refrigerate  THE DAY OF YOUR PROCEDURE      DATE: 01-11-10  DAY: Wednesday  Beginning at 4:00 a.m. (5 hours before procedure):         1. Every 15 minutes, drink the solution down to the next mark (approx 8 oz) until the full liter is complete.  2. Follow completed prep with 16 oz. of clear liquid of your choice.    3. You may drink clear liquids until 7:00 a.m. (2 HOURS BEFORE PROCEDURE).   MEDICATION INSTRUCTIONS  Unless otherwise instructed, you should take regular prescription medications with a small sip of water   as early as possible the morning of your procedure.  Diabetic patients - see separate instructions.  OTHER INSTRUCTIONS  You will need a responsible adult at least 53 years of age to accompany you and drive you home.   This person must remain in the waiting room during your procedure.  Wear loose fitting clothing that is easily removed.  Leave jewelry and other valuables at home.  However, you may wish to bring a book to read or  an iPod/MP3 player to listen to music as you wait for your procedure to start.  Remove all body piercing jewelry and leave at home.  Total time from sign-in until discharge is approximately 2-3 hours.  You should go home directly after your procedure and rest.  You can resume normal activities the  day after your procedure.  The day of your procedure you should not:   Drive   Make legal decisions   Operate machinery   Drink alcohol   Return to work  You will receive specific instructions about eating, activities and medications before you leave.    The above instructions have been reviewed and explained to me by   Sherren Kerns RN   December 26, 2009 7:55 AM    I fully understand and can verbalize these instructions _____________________________ Date _________

## 2010-02-21 NOTE — Progress Notes (Signed)
Summary: ct labs  Phone Note Other Incoming   Call placed by: rose #[id number] Call placed to: Dr. Debby Bud Summary of Call: sent stat CT report upstairs and wanted Dr. Debby Bud to look while pt was waiting Initial call taken by: Windell Norfolk,  February 09, 2008 4:14 PM  Follow-up for Phone Call        Dr. Debby Bud reviewed report and stated pt could go home  Follow-up by: Windell Norfolk,  February 09, 2008 4:14 PM

## 2010-02-21 NOTE — Progress Notes (Signed)
Summary: Test strips  Phone Note Refill Request      New/Updated Medications: ONETOUCH ULTRA TEST  STRP (GLUCOSE BLOOD) Check blood sugar levels two times a day   Prescriptions: ONETOUCH ULTRA TEST  STRP (GLUCOSE BLOOD) Check blood sugar levels two times a day  #60 x 5   Entered by:   Zackery Barefoot CMA   Authorized by:   Jacques Navy MD   Signed by:   Zackery Barefoot CMA on 02/10/2008   Method used:   Electronically to        [facility] [address] [facility] [address] [id number]* (retail)       [address] [place], [place]  [address]       Ph: [phone]       Fax: [phone]   RxID:   [id number]

## 2010-02-21 NOTE — Letter (Signed)
Summary: Diabetic Instructions  [facility]  [address]   [place], [place] [address]   Phone: [phone]  Fax: [phone]    Angel Gordon [birth year] MRN: [id number]   x     ORAL DIABETIC MEDICATION INSTRUCTIONS  The day before your procedure:   Take your diabetic pill as you do normally  The day of your procedure:   Do not take your diabetic pill    We will check your blood sugar levels during the admission process and again in Recovery before discharging you home  ________________________________________________________________________

## 2010-02-21 NOTE — Letter (Signed)
Summary: Eye Exam/Dr. Lita Mains. [facility] Exam/Dr. Lita Mains. [facility]   Imported By: Sherian Rein 04/07/2008 10:21:56  _____________________________________________________________________  External Attachment:    Type:   Image     Comment:   External Document

## 2010-02-21 NOTE — Letter (Signed)
Summary: [facility]  [facility]   Imported By: Sherian Rein 02/28/2009 10:21:41  _____________________________________________________________________  External Attachment:    Type:   Image     Comment:   External Document

## 2010-02-21 NOTE — Progress Notes (Signed)
Summary: LABS  Phone Note Call from Patient Call back at [facility] Phone [phone]   Caller: Patient Summary of Call: Angel Gordon SCHEDULED HER CPX FOR JULY 09.  SHE WOULD LIKE TO HAVE  LABS ADDED TO HER CPX LABS FOR HER DIABETES. Initial call taken by: Hilarie Fredrickson,  January 27, 2007 1:33 PM  Follow-up for Phone Call        will need A1C 250.00 Follow-up by: Jacques Navy MD,  January 27, 2007 4:17 PM  Additional Follow-up for Phone Call Additional follow up Details #1::        THE A1C HAS BEEN ADDED. Additional Follow-up by: Hilarie Fredrickson,  January 28, 2007 10:53 AM

## 2010-02-23 NOTE — Procedures (Signed)
Summary: Colonoscopy  Patient: Angel Gordon Note: All result statuses are Final unless otherwise noted.  Tests: (1) Colonoscopy (COL)   COL Colonoscopy           DONE     [facility]     [address]     [place], [place]  [address]           COLONOSCOPY PROCEDURE REPORT           PATIENT:  Angel Gordon, Angel Gordon  MR#:  [id number]     BIRTHDATE:  1955/05/21, 54 yrs. old  GENDER:  female           ENDOSCOPIST:  Barbette Hair. Arlyce Dice, MD     Referred by:  Rosalyn Gess. Norins, M.D.           PROCEDURE DATE:  01/11/2010     PROCEDURE:  Diagnostic Colonoscopy     ASA CLASS:  Class I     INDICATIONS:  1) Routine Risk Screening           MEDICATIONS:   Fentanyl 75 mcg IV, Versed 6 mg IV           DESCRIPTION OF PROCEDURE:   After the risks benefits and     alternatives of the procedure were thoroughly explained, informed     consent was obtained.  Digital rectal exam was performed and     revealed no abnormalities.   The LB CF-[id number] endoscope     was introduced through the anus and advanced to the cecum, which     was identified by both the appendix and ileocecal valve, without     limitations.  The quality of the prep was excellent, using     MoviPrep.  The instrument was then slowly withdrawn as the colon     was fully examined.     <<PROCEDUREIMAGES>>           FINDINGS:  A normal appearing cecum, ileocecal valve, and     appendiceal orifice were identified. The ascending, hepatic     flexure, transverse, splenic flexure, descending, sigmoid colon,     and rectum appeared unremarkable (see image2, image3, image5,     image6, image9, image10, image12, and image13).   Retroflexed     views in the rectum revealed no abnormalities.    The time to     cecum =  1.50  minutes. The scope was then withdrawn (time =  6.0     min) from the patient and the procedure completed.           COMPLICATIONS:  None           ENDOSCOPIC IMPRESSION:     1) Normal colon  RECOMMENDATIONS:     1) Continue current colorectal screening recommendations for     "routine risk" patients with a repeat colonoscopy in 10 years.           REPEAT EXAM:   10 year(s) Colonoscopy           ______________________________     Barbette Hair. Arlyce Dice, MD           CC:           n.     eSIGNED:   Barbette Hair. Kaplan at 01/11/2010 09:37 AM           Zerita Boers, [id number]  Note: An exclamation mark (!) indicates a result that was not dispersed into the flowsheet. Document Creation Date: 01/11/2010  9:37 AM _______________________________________________________________________  (1) Order result status: Final Collection or observation date-time: 01/11/2010 09:31 Requested date-time:  Receipt date-time:  Reported date-time:  Referring Physician:   Ordering Physician: Melvia Heaps [phone]) Specimen Source:  Source: Launa Grill Order Number: [id number] Lab site:   Appended Document: Colonoscopy    Clinical Lists Changes  Observations: Added new observation of COLONNXTDUE: 12/2019 (01/11/2010 9:43)

## 2010-04-03 LAB — GLUCOSE, CAPILLARY
Glucose-Capillary: 147 mg/dL — ABNORMAL HIGH (ref 70–99)
Glucose-Capillary: 178 mg/dL — ABNORMAL HIGH (ref 70–99)
Glucose-Capillary: 72 mg/dL (ref 70–99)

## 2010-04-11 ENCOUNTER — Telehealth: Payer: Self-pay | Admitting: Internal Medicine

## 2010-04-12 ENCOUNTER — Other Ambulatory Visit: Payer: Self-pay | Admitting: Internal Medicine

## 2010-04-17 ENCOUNTER — Telehealth: Payer: Self-pay | Admitting: Internal Medicine

## 2010-04-17 ENCOUNTER — Telehealth: Payer: Self-pay | Admitting: *Deleted

## 2010-04-17 MED ORDER — METFORMIN HCL 500 MG PO TABS: 500.0000 mg | ORAL_TABLET | Freq: Two times a day (BID) | ORAL | Status: DC

## 2010-04-17 NOTE — Telephone Encounter (Signed)
Pt is requesting refill on Metformin and states that she only has 3 pills left at this time.

## 2010-04-17 NOTE — Telephone Encounter (Signed)
Refill req from pharm/ already completed

## 2010-04-17 NOTE — Telephone Encounter (Signed)
Refill sent to [facility] [address] [place]

## 2010-04-19 ENCOUNTER — Telehealth: Payer: Self-pay | Admitting: *Deleted

## 2010-04-19 MED ORDER — METFORMIN HCL 500 MG PO TABS: 500.0000 mg | ORAL_TABLET | Freq: Two times a day (BID) | ORAL | Status: DC

## 2010-04-19 NOTE — Telephone Encounter (Signed)
refills  

## 2010-06-06 NOTE — Assessment & Plan Note (Signed)
[facility]                           PRIMARY CARE OFFICE NOTE   NAME:Angel Gordon, Angel Gordon                    MRN:          [id number]  DATE:08/20/2006                            DOB:          02/10/1955    Ms. Angel Gordon is a 55 year old African-American woman who presents today  for her annual physical exam.   CHIEF COMPLAINT:  The patient reports she has had problems with shoulder  pain on the left for approximately 6 months.  She reports that it  affects her range of motion and she is unable to fully raise her arm.  The pain also radiates from the pectoralis major insertion to the  shoulder itself, and down to the proximal upper extremity.  She was seen  for this problem by Dr. Efrain Sella, May 20, 2006, diagnosed with  shoulder pain.  She had been referred to Dr. Turner Daniels with an appointment  scheduled May 30, 2006; however, she never received this message and  never kept this appointment.   Diabetes:  The patient's last hemoglobin A1c was 7%, November of 2007  with serum glucose at that time of 136.  She has been making good food  choices, and working to control her blood sugar with diet and exercise,  although mostly diet.   PAST MEDICAL HISTORY:   SURGICAL:  1. BSO after tube pregnancy in 1984.  2. Gravida 3, para 1 with spontaneous loss.   MEDICAL ILLNESSES:  1. The patient had the usual childhood illnesses.  2. Minor respiratory infections.  3. Non-insulin dependent diabetes.   CURRENT MEDICATIONS:  No prescription drugs.   FAMILY HISTORY:  Father was killed in an automobile accident at 61.  Mother with history of cancer, CVA, hypertension, and kidney stones.   SOCIAL HISTORY:  The patient was married for 3 years, but has remained  single since that time.  She has a son who is in his mid 4s.  She works  for [employer] and has survived the latest round of financial  turnaround at [employer].   REVIEW OF SYSTEMS:  Negative for any  constitutional, cardiovascular,  respiratory, GI, or GU problems.  Musculoskeletal complaints as above.   PHYSICAL EXAMINATION:  Temperature was 97.3, blood pressure 115/74,  pulse was 80, weight 191, height 4 feet 10 inches, given her a BMI of  39.9, which is obesity class 2.  GENERAL APPEARANCE:  This is an overweight African-American female who  is in no acute distress.  HEENT EXAM:  Normocephalic, atraumatic.  EACs and TMs were unremarkable.  Oropharynx with native dentition in good repair.  No buccal or palate  lesions were noted.  Posterior pharynx was clear.  Conjunctivae and  sclerae were clear.  PERRLA.  EOMI.  Funduscopic exam with handheld  instrument revealed normal disc margins with no vascular abnormalities.  NECK:  Supple. She has an easily palpable thyroid gland at the  borderline of normal size with no nodules or palpable abnormalities.  She does have an easily palpable thyroid isthmus.  NODES:  No lymphadenopathy was noted in the submandibular, cervical,  supraclavicular or axillary  regions.  CHEST:  No deformities were noted, no CVA tenderness.  LUNGS:  Clear with no rales, wheezes, or rhonchi.  BREAST EXAM:  Deferred to gynecology.  CARDIOVASCULAR:  2+ radial pulses, no JVD or carotid bruits.  She had a  regular rate and rhythm without murmurs, rubs, or gallops.  ABDOMEN:  Obese, no guarding, no rebound.  No organosplenomegaly was  noted.  PELVIC/RECTAL EXAMS:  Deferred to gynecology.  EXTREMITIES:  The patient has pain in her left shoulder with extension  and flexion, abduction and adduction.  She has discomfort with movement  against resistance.  She has normal grip strength, normal sensation to  light touch.  Right shoulder was normal.  Other extremities were normal.  NEURO:  The patient is awake, alert, oriented to person, place, and time  and context in a nonfocal examination.  SKIN:  Clear.   DATABASE:  Hemoglobin 12.2 g, white count was 6,000.  Chemistries  were  unremarkable except for a serum glucose of 126, kidney function was  normal with a creatinine of 0.9.  Liver functions were normal.  Cholesterol was 99, triglycerides 60, HDL 46.7, LDL was 40.  Thyroid  function normal with a TSH of 3.42, urinalysis was notable for a small  amount of blood with zero to 2 RBCs per high-power field.   ASSESSMENT AND PLAN:  1. Diabetes:  The patient seems well controlled and does follow a      diabetic diet.  Hemoglobin A1c was not performed with this set of      labs, but her serum glucose is actually lower than a previous      study.  Her hemoglobin A1c is most likely 7% or less.  Plan:  The      patient to continue on diet management.  2. Shoulder pain:  The patient with degenerative joint disease in the      shoulder versus bursitis versus torn rotator cuff given her      limitation and range of motion.  The patient declines the      intraarticular joint injection.  Plan:  The patient would like a      referral to Dr. Turner Daniels, and asked to contact her via her cell phone.  3. Weight management:  The patient reviewed her diet with me which      seems to be excellent in regards to food choices.  I have asked her      to do a calorie count and total calorie intake.  4. Health maintenance:  Last mammogram July 7 of 2008 and was      unremarkable.  The patient was seen by Dr. Chevis Pretty for gynecology      February of 2008 with normal exam.  The patient would be a      candidate for colorectal cancer screening at age 65.   In summary, this is a very pleasant woman who seems to be medically  stable except for a problem with weight management and shoulder pain.  She will return to see me on a p.r.n. basis or in 6 months.     Angel Gess Norins, MD  Electronically Signed    MEN/MedQ  DD: 08/21/2006  DT: 08/21/2006  Job #: [id number]   cc:   Ms. Payton Mccallum Turner Daniels, M.D.

## 2010-06-09 NOTE — Letter (Signed)
August 21, 2006    Feliberto Gottron. Turner Daniels, M.D.  [address]  [place], [place] [address]   RE:  LATIMA, HAMZA  MRN:  [id number]  /  DOB:  07/17/1955   Dear Dr. Turner Daniels,   Thank you very much for seeing and evaluating Ms. Brooker for persistent  left shoulder pain.   The patient reports she has had a 6 month history of pain and discomfort  with some limitation in range of motion. She does not recall an  initiating event.   On examination in the office she does have an approximately 20%  limitation in range of motion and significant discomfort in all degrees  of motion.  Neurologically she is intact. I am concerned she may have a rotator cuff  tear versus chronic persistent bursitis.   Enclosed please find my complete dictated note from today that includes  all pertinent medical history.   I do understand the patient had been scheduled to see you on May 8,  unfortunately there was a lapse in our office and the patient was never  notified of the appointment. I do apologize for this disruption of your  schedule.   Thank you once again for seeing this nice patient. I look forward to  hearing from you.    Sincerely,      Rosalyn Gess. Norins, MD  Electronically Signed    MEN/MedQ  DD: 08/21/2006  DT: 08/21/2006  Job #: [id number]

## 2010-06-21 NOTE — Telephone Encounter (Signed)
error 

## 2010-08-25 ENCOUNTER — Other Ambulatory Visit: Payer: Self-pay | Admitting: Internal Medicine

## 2010-08-25 ENCOUNTER — Ambulatory Visit: Payer: Self-pay

## 2010-08-25 DIAGNOSIS — E119 Type 2 diabetes mellitus without complications: Secondary | ICD-10-CM

## 2010-08-25 DIAGNOSIS — Z0000 Encounter for general adult medical examination without abnormal findings: Secondary | ICD-10-CM

## 2010-08-25 LAB — HEMOGLOBIN A1C: Hgb A1c MFr Bld: 6.8 % — ABNORMAL HIGH (ref 4.6–6.5)

## 2010-08-25 LAB — URINALYSIS, ROUTINE W REFLEX MICROSCOPIC
Bilirubin Urine: NEGATIVE
Ketones, ur: NEGATIVE
Leukocytes, UA: NEGATIVE
Nitrite: NEGATIVE
Specific Gravity, Urine: >=1.03 (ref 1.000–1.030)
Total Protein, Urine: NEGATIVE
Urine Glucose: 100
Urobilinogen, UA: 0.2 (ref 0.0–1.0)
pH: 5.5 (ref 5.0–8.0)

## 2010-08-25 LAB — LIPID PANEL
Cholesterol: 115 mg/dL (ref 0–200)
HDL: 75 mg/dL (ref >39.00)
LDL Cholesterol: 29 mg/dL (ref 0–99)
Total CHOL/HDL Ratio: 2
Triglycerides: 54 mg/dL (ref 0.0–149.0)
VLDL: 10.8 mg/dL (ref 0.0–40.0)

## 2010-08-25 LAB — CBC WITH DIFFERENTIAL/PLATELET
Basophils Absolute: 0 10*3/uL (ref 0.0–0.1)
Basophils Relative: 0.6 % (ref 0.0–3.0)
Eosinophils Absolute: 0.4 10*3/uL (ref 0.0–0.7)
Eosinophils Relative: 7.6 % — ABNORMAL HIGH (ref 0.0–5.0)
HCT: 35.6 % — ABNORMAL LOW (ref 36.0–46.0)
Hemoglobin: 11.6 g/dL — ABNORMAL LOW (ref 12.0–15.0)
Lymphocytes Relative: 39.9 % (ref 12.0–46.0)
Lymphs Abs: 2.2 10*3/uL (ref 0.7–4.0)
MCHC: 32.7 g/dL (ref 30.0–36.0)
MCV: 86.1 fl (ref 78.0–100.0)
Monocytes Absolute: 0.4 10*3/uL (ref 0.1–1.0)
Monocytes Relative: 6.8 % (ref 3.0–12.0)
Neutro Abs: 2.5 10*3/uL (ref 1.4–7.7)
Neutrophils Relative %: 45.1 % (ref 43.0–77.0)
Platelets: 197 10*3/uL (ref 150.0–400.0)
RBC: 4.13 Mil/uL (ref 3.87–5.11)
RDW: 14.4 % (ref 11.5–14.6)
WBC: 5.6 10*3/uL (ref 4.5–10.5)

## 2010-08-25 LAB — BASIC METABOLIC PANEL
BUN: 20 mg/dL (ref 6–23)
CO2: 28 mEq/L (ref 19–32)
Calcium: 9.5 mg/dL (ref 8.4–10.5)
Chloride: 105 mEq/L (ref 96–112)
Creatinine, Ser: 0.9 mg/dL (ref 0.4–1.2)
GFR: 84.33 mL/min (ref >60.00)
Glucose, Bld: 96 mg/dL (ref 70–99)
Potassium: 3.6 mEq/L (ref 3.5–5.1)
Sodium: 144 mEq/L (ref 135–145)

## 2010-08-25 LAB — TSH: TSH: 3.2 u[IU]/mL (ref 0.35–5.50)

## 2010-08-25 LAB — HEPATIC FUNCTION PANEL
ALT: 28 U/L (ref 0–35)
AST: 27 U/L (ref 0–37)
Albumin: 4.2 g/dL (ref 3.5–5.2)
Alkaline Phosphatase: 61 U/L (ref 39–117)
Bilirubin, Direct: 0.1 mg/dL (ref 0.0–0.3)
Total Bilirubin: 0.6 mg/dL (ref 0.3–1.2)
Total Protein: 8.6 g/dL — ABNORMAL HIGH (ref 6.0–8.3)

## 2010-09-19 ENCOUNTER — Ambulatory Visit (INDEPENDENT_AMBULATORY_CARE_PROVIDER_SITE_OTHER): Payer: BC Managed Care – PPO | Admitting: Internal Medicine

## 2010-09-19 ENCOUNTER — Encounter: Payer: Self-pay | Admitting: Internal Medicine

## 2010-09-19 VITALS — BP 114/78 | HR 71 | Temp 97.7°F | Wt 180.0 lb

## 2010-09-19 DIAGNOSIS — Z0000 Encounter for general adult medical examination without abnormal findings: Secondary | ICD-10-CM

## 2010-09-19 DIAGNOSIS — E119 Type 2 diabetes mellitus without complications: Secondary | ICD-10-CM

## 2010-09-19 DIAGNOSIS — Z136 Encounter for screening for cardiovascular disorders: Secondary | ICD-10-CM

## 2010-09-19 DIAGNOSIS — E669 Obesity, unspecified: Secondary | ICD-10-CM

## 2010-09-21 ENCOUNTER — Encounter: Payer: Self-pay | Admitting: Internal Medicine

## 2010-09-21 DIAGNOSIS — Z0000 Encounter for general adult medical examination without abnormal findings: Secondary | ICD-10-CM | POA: Insufficient documentation

## 2010-09-21 DIAGNOSIS — E669 Obesity, unspecified: Secondary | ICD-10-CM | POA: Insufficient documentation

## 2010-09-21 NOTE — Assessment & Plan Note (Signed)
Patient is aware of this as a problem. She working hard to loose weight and has met with some success.  Plan - continued weight management: smart food choices, portion size control and exercise. Goal - to loose 1 lb a month.

## 2010-09-21 NOTE — Assessment & Plan Note (Signed)
Good control on her present regimen.   Plan -no changes in medication.

## 2010-09-21 NOTE — Progress Notes (Signed)
Subjective:    Patient ID: Angel Gordon, female    DOB: [birth year], 55 y.o.   MRN: [id number]  HPI Angel Gordon presents for routine medical exam. She has been working hard on controlling blood sugar, cholesterol and trying to loose weight. She has enjoyed good health in the interval since her last visit.   Past Medical History  Diagnosis Date  . Type II or unspecified type diabetes mellitus without mention of complication, not stated as uncontrolled    Past Surgical History  Procedure Date  . Foot surgery   . Tubal ligation    Family History  Problem Relation Age of Onset  . Hypertension Mother   . Cancer Maternal Grandmother     colon   History   Social History  . Marital Status: Married    Spouse Name: N/A    Number of Children: 1  . Years of Education: 12+   Occupational History  .  [employer]Duty   Social History Main Topics  . Smoking status: Never Smoker   . Smokeless tobacco: Never Used  . Alcohol Use: No  . Drug Use: No  . Sexually Active: Not on file   Other Topics Concern  . Not on file   Social History Narrative   [employer], [employer] - classes, married '78 - 3 years, divorced; married '96. 1 son ' '79; 3 grandchildren. work: [place]       Review of Systems Review of Systems  Constitutional:  Negative for fever, chills, activity change and unexpected weight change.  HEENT:  Negative for hearing loss, ear pain, congestion, neck stiffness and postnasal drip. Negative for sore throat or swallowing problems. Negative for dental complaints.   Eyes: Negative for vision loss or change in visual acuity.  Respiratory: Negative for chest tightness and wheezing.   Cardiovascular: Negative for chest pain and palpitation. No decreased exercise tolerance Gastrointestinal: No change in bowel habit. No bloating or gas. No reflux or indigestion Genitourinary: Negative for urgency, frequency, flank pain and difficulty urinating.  Musculoskeletal: Negative for myalgias, back  pain, arthralgias and gait problem.  Neurological: Negative for dizziness, tremors, weakness and headaches.  Hematological: Negative for adenopathy.  Psychiatric/Behavioral: Negative for behavioral problems and dysphoric mood.       Objective:   Physical Exam Vitals reviewed: stable Gen'l: well nourished, well developed, obese AA woman in no distress HEENT - [place]/AT, EACs/TMs normal, oropharynx with no buccal or palatal lesions, posterior pharynx clear, mucous membranes moist. C&S clear, PERRLA, fundi - normal Neck - supple, no thyromegaly Nodes- negative submental, cervical, supraclavicular regions Chest - no deformity, no CVAT Lungs - cleat without rales, wheezes. No increased work of breathing Breast - deferred to gyn  Cardiovascular - regular rate and rhythm, quiet precordium, no murmurs, rubs or gallops, 2+ radial, DP and PT pulses Abdomen - BS+ x 4, no HSM, no guarding or rebound or tenderness Pelvic - deferred to gyn Rectal - deferred to gyn Extremities - no clubbing, cyanosis, edema or deformity.  Neuro - A&O x 3, CN II-XII normal, motor strength normal and equal, DTRs 2+ and symmetrical biceps, radial, and patellar tendons. Cerebellar - no tremor, no rigidity, fluid movement and normal gait. Derm - Head, neck, back, abdomen and extremities without suspicious lesions  Lab Results  Component Value Date   WBC 5.6 08/25/2010   HGB 11.6* 08/25/2010   HCT 35.6* 08/25/2010   PLT 197.0 08/25/2010   CHOL 115 08/25/2010   TRIG 54.0 08/25/2010   HDL 75.00  08/25/2010   ALT 28 08/25/2010   AST 27 08/25/2010   NA 144 08/25/2010   K 3.6 08/25/2010   CL 105 08/25/2010   CREATININE 0.9 08/25/2010   BUN 20 08/25/2010   CO2 28 08/25/2010   TSH 3.20 08/25/2010   HGBA1C 6.8* 08/25/2010   Lab Results  Component Value Date   LDLCALC 29 08/25/2010          Assessment & Plan:

## 2010-09-21 NOTE — Assessment & Plan Note (Signed)
Interval medical history is benign. Physical exam, sans pelvic and breast exam, is normal. Lab results are in normal range. She is current with her gynecologist for Pelvic/PAP and breast exam. Last mammogram June '12. Last colonoscopy Dec '12. Immjunizations - given Tdap today.  In summary- a very nice woman who is working hard to manage her weight and diabetes. She will return in 6 months for routine follow-up and she will be 6 lbs lighter.

## 2010-12-19 ENCOUNTER — Other Ambulatory Visit: Payer: Self-pay | Admitting: *Deleted

## 2010-12-19 MED ORDER — SCOPOLAMINE 1 MG/3DAYS TD PT72: 1.0000 | MEDICATED_PATCH | TRANSDERMAL | Status: DC

## 2010-12-19 NOTE — Telephone Encounter (Signed)
Request for Motion Sickness Patch to [facility].

## 2010-12-19 NOTE — Telephone Encounter (Signed)
scoplamine patch sent to pharmacy

## 2010-12-19 NOTE — Telephone Encounter (Signed)
LMOM to inform pt °

## 2011-04-02 ENCOUNTER — Ambulatory Visit (INDEPENDENT_AMBULATORY_CARE_PROVIDER_SITE_OTHER): Payer: BC Managed Care – PPO | Admitting: Internal Medicine

## 2011-04-02 ENCOUNTER — Encounter: Payer: Self-pay | Admitting: Internal Medicine

## 2011-04-02 DIAGNOSIS — Z23 Encounter for immunization: Secondary | ICD-10-CM

## 2011-04-02 DIAGNOSIS — M545 Low back pain, unspecified: Secondary | ICD-10-CM

## 2011-04-02 DIAGNOSIS — R319 Hematuria, unspecified: Secondary | ICD-10-CM

## 2011-04-02 LAB — POCT URINALYSIS DIPSTICK
Glucose, UA: NEGATIVE
Leukocytes, UA: NEGATIVE
Nitrite, UA: NEGATIVE
Spec Grav, UA: >=1.03
Urobilinogen, UA: 0.2
pH, UA: 5

## 2011-04-02 MED ORDER — PROMETHAZINE-CODEINE 6.25-10 MG/5ML PO SYRP: 5.0000 mL | ORAL_SOLUTION | ORAL | Status: AC | PRN

## 2011-04-02 MED ORDER — SULFAMETHOXAZOLE-TRIMETHOPRIM 800-160 MG PO TABS: 1.0000 | ORAL_TABLET | Freq: Two times a day (BID) | ORAL | Status: AC

## 2011-04-02 NOTE — Progress Notes (Signed)
  Subjective:    Patient ID: Angel Gordon, female    DOB: Jun 17, 1955, 56 y.o.   MRN: [id number]  HPI Angel Gordon presents for evaluation of fever and hematuria. She reports a problem with back pain followed by Dr. Turner Daniels for orthopedics. She has been started on NSAIDs and muscle relaxants but has continued pain. Today she had chills, sweats, increase back pain and hematuria. She is worried for kidney infection.  PMH, FamHx and SocHx reviewed for any changes and relevance.    Review of Systems System review is negative for any constitutional, cardiac, pulmonary, GI or neuro symptoms or complaints other than as described in the HPI.     Objective:   Physical Exam Filed Vitals:   04/02/11 1135  BP: 112/78  Pulse: 75  Temp: 97.6 F (36.4 C)  Resp: 16   Wt Readings from Last 3 Encounters:  04/02/11 174 lb (78.926 kg)  09/19/10 180 lb (81.647 kg)  09/16/09 182 lb (82.555 kg)   Gen'l- overweight AA woman in no acute distress Back - no CVAT or flank tenderness Abd - no tenderness over lateral aspect to deep palpation; tender to palpation in the suprapubic region  Dip U/A positive for blood, mild LE       Assessment & Plan:  UTI - hematuria, suprapubic pain and positive dip U/A  Plan Septra DS x 7 days

## 2011-04-02 NOTE — Patient Instructions (Signed)
Low back pain - I am sorry that you are in pain. You should continue to follow with Dr. Turner Daniels and associates for this problem  Cough - may be a viral respiratory infection although the antibiotic for the bladder infection will cover you.  Bladder infection - no evidence of kidney involvement. Plan take septra DS twice a day for a week.   Urinary Tract Infection Infections of the urinary tract can start in several places. A bladder infection (cystitis), a kidney infection (pyelonephritis), and a prostate infection (prostatitis) are different types of urinary tract infections (UTIs). They usually get better if treated with medicines (antibiotics) that kill germs. Take all the medicine until it is gone. You or your child may feel better in a few days, but TAKE ALL MEDICINE or the infection may not respond and may become more difficult to treat. HOME CARE INSTRUCTIONS    Drink enough water and fluids to keep the urine clear or pale yellow. Cranberry juice is especially recommended, in addition to large amounts of water.   Avoid caffeine, tea, and carbonated beverages. They tend to irritate the bladder.   Alcohol may irritate the prostate.   Only take over-the-counter or prescription medicines for pain, discomfort, or fever as directed by your caregiver.  To prevent further infections:  Empty the bladder often. Avoid holding urine for long periods of time.   After a bowel movement, women should cleanse from front to back. Use each tissue only once.   Empty the bladder before and after sexual intercourse.  FINDING OUT THE RESULTS OF YOUR TEST Not all test results are available during your visit. If your or your child's test results are not back during the visit, make an appointment with your caregiver to find out the results. Do not assume everything is normal if you have not heard from your caregiver or the medical facility. It is important for you to follow up on all test results. SEEK MEDICAL  CARE IF:    There is back pain.   Your baby is older than 3 months with a rectal temperature of 100.5 F (38.1 C) or higher for more than 1 day.   Your or your child's problems (symptoms) are no better in 3 days. Return sooner if you or your child is getting worse.  SEEK IMMEDIATE MEDICAL CARE IF:    There is severe back pain or lower abdominal pain.   You or your child develops chills.   You have a fever.   Your baby is older than 3 months with a rectal temperature of 102 F (38.9 C) or higher.   Your baby is 82 months old or younger with a rectal temperature of 100.4 F (38 C) or higher.   There is nausea or vomiting.   There is continued burning or discomfort with urination.  MAKE SURE YOU:    Understand these instructions.   Will watch your condition.   Will get help right away if you are not doing well or get worse.  Document Released: 10/18/2004 Document Revised: 12/28/2010 Document Reviewed: 05/23/2006 [facility] Patient Information 2012 [place], [place].

## 2011-05-10 ENCOUNTER — Other Ambulatory Visit: Payer: Self-pay

## 2011-05-10 MED ORDER — METFORMIN HCL 500 MG PO TABS: 500.0000 mg | ORAL_TABLET | Freq: Two times a day (BID) | ORAL | Status: DC

## 2011-08-09 ENCOUNTER — Encounter (HOSPITAL_COMMUNITY): Payer: Self-pay | Admitting: Pharmacist

## 2011-08-10 MED ORDER — KETOROLAC TROMETHAMINE 60 MG/2ML IM SOLN
INTRAMUSCULAR | Status: AC
  Filled 2011-08-10: qty 2

## 2011-08-14 ENCOUNTER — Encounter (HOSPITAL_COMMUNITY): Payer: Self-pay

## 2011-08-14 ENCOUNTER — Other Ambulatory Visit: Payer: Self-pay

## 2011-08-14 ENCOUNTER — Encounter (HOSPITAL_COMMUNITY)
Admission: RE | Admit: 2011-08-14 | Discharge: 2011-08-14 | Disposition: A | Discharge status: Home / Self Care | Payer: BC Managed Care – PPO | Source: Ambulatory Visit | Attending: Obstetrics and Gynecology | Admitting: Obstetrics and Gynecology

## 2011-08-14 LAB — CBC
HCT: 39 % (ref 36.0–46.0)
Hemoglobin: 12.1 g/dL (ref 12.0–15.0)
MCH: 27.1 pg (ref 26.0–34.0)
MCHC: 31 g/dL (ref 30.0–36.0)
MCV: 87.2 fL (ref 78.0–100.0)
Platelets: 217 10*3/uL (ref 150–400)
RBC: 4.47 MIL/uL (ref 3.87–5.11)
RDW: 13.8 % (ref 11.5–15.5)
WBC: 4.6 10*3/uL (ref 4.0–10.5)

## 2011-08-14 LAB — BASIC METABOLIC PANEL
BUN: 12 mg/dL (ref 6–23)
CO2: 28 mEq/L (ref 19–32)
Calcium: 9.6 mg/dL (ref 8.4–10.5)
Chloride: 103 mEq/L (ref 96–112)
Creatinine, Ser: 0.88 mg/dL (ref 0.50–1.10)
GFR calc Af Amer: 85 mL/min — ABNORMAL LOW (ref >90)
GFR calc non Af Amer: 74 mL/min — ABNORMAL LOW (ref >90)
Glucose, Bld: 111 mg/dL — ABNORMAL HIGH (ref 70–99)
Potassium: 3.9 mEq/L (ref 3.5–5.1)
Sodium: 139 mEq/L (ref 135–145)

## 2011-08-14 NOTE — Patient Instructions (Addendum)
   Your procedure is scheduled on: Friday July 26th   Enter through the Main Entrance of [facility] at: [employer] up the phone at the desk and dial [phone] and inform us of your arrival.  Please call this number if you have any problems the morning of surgery: [phone]  Remember: Do not eat food after midnight: Thursday Do not drink clear liquids after: midnight Thursday Take these medicines the morning of surgery with a SIP OF WATER: none. Hold metformin Thursday evening dose  Do not wear jewelry, make-up, or FINGER nail polish No metal in your hair or on your body. Do not wear lotions, powders, perfumes or deodorant. Do not shave 48 hours prior to surgery. Do not bring valuables to the hospital. Contacts, dentures or bridgework may not be worn into surgery.    Patients discharged on the day of surgery will not be allowed to drive home.     Remember to use your hibiclens as instructed.Please shower with 1/2 bottle the evening before your surgery and the other 1/2 bottle the morning of surgery. Neck down avoiding private area.

## 2011-08-16 MED ORDER — DEXTROSE 5 % IV SOLN
2.0000 g | INTRAVENOUS | Status: AC
  Administered 2011-08-17: 2 g via INTRAVENOUS
  Filled 2011-08-16: qty 2

## 2011-08-17 ENCOUNTER — Ambulatory Visit (HOSPITAL_COMMUNITY)
Admission: RE | Admit: 2011-08-17 | Discharge: 2011-08-17 | Disposition: A | Discharge status: Home / Self Care | Payer: BC Managed Care – PPO | Source: Ambulatory Visit | Attending: Obstetrics and Gynecology | Admitting: Obstetrics and Gynecology

## 2011-08-17 ENCOUNTER — Encounter (HOSPITAL_COMMUNITY): Payer: Self-pay | Admitting: Anesthesiology

## 2011-08-17 ENCOUNTER — Ambulatory Visit (HOSPITAL_COMMUNITY): Payer: BC Managed Care – PPO | Admitting: Anesthesiology

## 2011-08-17 ENCOUNTER — Encounter (HOSPITAL_COMMUNITY)
Admission: RE | Disposition: A | Discharge status: Home / Self Care | Payer: Self-pay | Source: Ambulatory Visit | Attending: Obstetrics and Gynecology

## 2011-08-17 DIAGNOSIS — Z01818 Encounter for other preprocedural examination: Secondary | ICD-10-CM | POA: Insufficient documentation

## 2011-08-17 DIAGNOSIS — D251 Intramural leiomyoma of uterus: Secondary | ICD-10-CM | POA: Insufficient documentation

## 2011-08-17 DIAGNOSIS — Z01812 Encounter for preprocedural laboratory examination: Secondary | ICD-10-CM | POA: Insufficient documentation

## 2011-08-17 DIAGNOSIS — N950 Postmenopausal bleeding: Secondary | ICD-10-CM | POA: Insufficient documentation

## 2011-08-17 HISTORY — PX: HYSTEROSCOPY WITH D & C: SHX1775

## 2011-08-17 LAB — GLUCOSE, CAPILLARY: Glucose-Capillary: 115 mg/dL — ABNORMAL HIGH (ref 70–99)

## 2011-08-17 SURGERY — DILATATION AND CURETTAGE /HYSTEROSCOPY
Anesthesia: General | Site: Vagina | Wound class: Clean Contaminated

## 2011-08-17 MED ORDER — LIDOCAINE HCL 1 % IJ SOLN
INTRAMUSCULAR | Status: DC | PRN
  Administered 2011-08-17: 9 mL

## 2011-08-17 MED ORDER — LIDOCAINE HCL (CARDIAC) 20 MG/ML IV SOLN
INTRAVENOUS | Status: AC
  Filled 2011-08-17: qty 5

## 2011-08-17 MED ORDER — FENTANYL CITRATE 0.05 MG/ML IJ SOLN
INTRAMUSCULAR | Status: DC | PRN
  Administered 2011-08-17: 100 ug via INTRAVENOUS

## 2011-08-17 MED ORDER — METOCLOPRAMIDE HCL 5 MG/ML IJ SOLN: 10.0000 mg | Freq: Once | INTRAMUSCULAR | Status: DC | PRN

## 2011-08-17 MED ORDER — KETOROLAC TROMETHAMINE 30 MG/ML IJ SOLN
INTRAMUSCULAR | Status: DC | PRN
  Administered 2011-08-17: 30 mg via INTRAVENOUS

## 2011-08-17 MED ORDER — MIDAZOLAM HCL 2 MG/2ML IJ SOLN
INTRAMUSCULAR | Status: AC
  Filled 2011-08-17: qty 2

## 2011-08-17 MED ORDER — ONDANSETRON HCL 4 MG/2ML IJ SOLN
INTRAMUSCULAR | Status: DC | PRN
  Administered 2011-08-17: 4 mg via INTRAVENOUS

## 2011-08-17 MED ORDER — GLYCINE 1.5 % IR SOLN
Status: DC | PRN
  Administered 2011-08-17: 3000 mL

## 2011-08-17 MED ORDER — HYDROCODONE-ACETAMINOPHEN 5-500 MG PO TABS: 1.0000 | ORAL_TABLET | Freq: Four times a day (QID) | ORAL | Status: AC | PRN

## 2011-08-17 MED ORDER — PROPOFOL 10 MG/ML IV EMUL
INTRAVENOUS | Status: DC | PRN
  Administered 2011-08-17: 150 mg via INTRAVENOUS

## 2011-08-17 MED ORDER — KETOROLAC TROMETHAMINE 60 MG/2ML IM SOLN
INTRAMUSCULAR | Status: AC
  Filled 2011-08-17: qty 2

## 2011-08-17 MED ORDER — LACTATED RINGERS IV SOLN
INTRAVENOUS | Status: DC
  Administered 2011-08-17 (×2): via INTRAVENOUS

## 2011-08-17 MED ORDER — LIDOCAINE HCL (CARDIAC) 20 MG/ML IV SOLN
INTRAVENOUS | Status: DC | PRN
  Administered 2011-08-17: 60 mg via INTRAVENOUS

## 2011-08-17 MED ORDER — FENTANYL CITRATE 0.05 MG/ML IJ SOLN: 25.0000 ug | INTRAMUSCULAR | Status: DC | PRN

## 2011-08-17 MED ORDER — FENTANYL CITRATE 0.05 MG/ML IJ SOLN
INTRAMUSCULAR | Status: AC
  Filled 2011-08-17: qty 2

## 2011-08-17 MED ORDER — PROPOFOL 10 MG/ML IV EMUL
INTRAVENOUS | Status: AC
  Filled 2011-08-17: qty 20

## 2011-08-17 MED ORDER — ONDANSETRON HCL 4 MG/2ML IJ SOLN
INTRAMUSCULAR | Status: AC
  Filled 2011-08-17: qty 2

## 2011-08-17 MED ORDER — MIDAZOLAM HCL 5 MG/5ML IJ SOLN
INTRAMUSCULAR | Status: DC | PRN
  Administered 2011-08-17: 2 mg via INTRAVENOUS

## 2011-08-17 MED ORDER — MEPERIDINE HCL 25 MG/ML IJ SOLN: 6.2500 mg | INTRAMUSCULAR | Status: DC | PRN

## 2011-08-17 SURGICAL SUPPLY — 17 items
ABLATOR ENDOMETRIAL BIPOLAR (ABLATOR) IMPLANT
CANISTER SUCTION 2500CC (MISCELLANEOUS) ×2 IMPLANT
CATH ROBINSON RED A/P 16FR (CATHETERS) ×2 IMPLANT
CATH THERMACHOICE III (CATHETERS) IMPLANT
CLOTH BEACON ORANGE TIMEOUT ST (SAFETY) ×2 IMPLANT
CONTAINER PREFILL 10% NBF 60ML (FORM) ×3 IMPLANT
ELECT REM PT RETURN 9FT ADLT (ELECTROSURGICAL) ×2
ELECTRODE REM PT RTRN 9FT ADLT (ELECTROSURGICAL) ×1 IMPLANT
GLOVE BIO SURGEON STRL SZ 6.5 (GLOVE) ×2 IMPLANT
GLOVE BIOGEL PI IND STRL 7.0 (GLOVE) ×1 IMPLANT
GLOVE BIOGEL PI INDICATOR 7.0 (GLOVE) ×1
GOWN PREVENTION PLUS LG XLONG (DISPOSABLE) ×2 IMPLANT
GOWN STRL REIN XL XLG (GOWN DISPOSABLE) ×2 IMPLANT
LOOP ANGLED CUTTING 22FR (CUTTING LOOP) IMPLANT
PACK HYSTEROSCOPY LF (CUSTOM PROCEDURE TRAY) ×2 IMPLANT
TOWEL OR 17X24 6PK STRL BLUE (TOWEL DISPOSABLE) ×4 IMPLANT
WATER STERILE IRR 1000ML POUR (IV SOLUTION) ×2 IMPLANT

## 2011-08-17 NOTE — Anesthesia Preprocedure Evaluation (Addendum)
Anesthesia Evaluation  Patient identified by MRN, date of birth, ID band Patient awake    Reviewed: Allergy & Precautions, H&P , NPO status , Patient's Chart, lab work & pertinent test results  Airway Mallampati: III TM Distance: >3 FB Neck ROM: Full    Dental No notable dental hx. (+) Teeth Intact   Pulmonary neg pulmonary ROS,  breath sounds clear to auscultation  Pulmonary exam normal       Cardiovascular negative cardio ROS  Rhythm:Regular Rate:Normal     Neuro/Psych negative neurological ROS  negative psych ROS   GI/Hepatic negative GI ROS, Neg liver ROS,   Endo/Other  Well Controlled, Type 2, Oral Hypoglycemic AgentsObesity  Renal/GU negative Renal ROS  negative genitourinary   Musculoskeletal negative musculoskeletal ROS (+)   Abdominal   Peds  Hematology negative hematology ROS (+)   Anesthesia Other Findings   Reproductive/Obstetrics Endometrial Polyp PMB Endometrial Hyperplasia                         Anesthesia Physical Anesthesia Plan  ASA: II  Anesthesia Plan: General   Post-op Pain Management:    Induction: Intravenous  Airway Management Planned: LMA  Additional Equipment:   Intra-op Plan:   Post-operative Plan: Extubation in OR  Informed Consent: I have reviewed the patients History and Physical, chart, labs and discussed the procedure including the risks, benefits and alternatives for the proposed anesthesia with the patient or authorized representative who has indicated his/her understanding and acceptance.   Dental advisory given  Plan Discussed with: CRNA, Anesthesiologist and Surgeon  Anesthesia Plan Comments:         Anesthesia Quick Evaluation

## 2011-08-17 NOTE — Anesthesia Procedure Notes (Signed)
Procedure Name: LMA Insertion Date/Time: 08/17/2011 7:27 AM Performed by: Graciela Husbands Pre-anesthesia Checklist: Patient identified, Timeout performed, Emergency Drugs available, Suction available and Patient being monitored Patient Re-evaluated:Patient Re-evaluated prior to inductionOxygen Delivery Method: Circle system utilized Preoxygenation: Pre-oxygenation with 100% oxygen Intubation Type: IV induction LMA: LMA inserted LMA Size: 4.0 Number of attempts: 1 Placement Confirmation: positive ETCO2 and breath sounds checked- equal and bilateral Tube secured with: Tape Dental Injury: Teeth and Oropharynx as per pre-operative assessment

## 2011-08-17 NOTE — Transfer of Care (Signed)
Immediate Anesthesia Transfer of Care Note  Patient: Angel Gordon  Procedure(s) Performed: Procedure(s) (LRB): DILATATION AND CURETTAGE /HYSTEROSCOPY (N/A)  Patient Location: PACU  Anesthesia Type: General  Level of Consciousness: awake, alert  and oriented  Airway & Oxygen Therapy: Patient Spontanous Breathing and Patient connected to nasal cannula oxygen  Post-op Assessment: Report given to PACU RN and Post -op Vital signs reviewed and stable  Post vital signs: Reviewed and stable  Complications: No apparent anesthesia complications

## 2011-08-17 NOTE — H&P (Signed)
56 yo w/ irregular PMB presents for surgical mngt  PMHx:  T2DM PSHx:  c-section, BTL All:  None Meds:  Metformin FHx:  [place]/ ShX:  Negative for tobacco  AF, VSS GEN - NAD Abd - soft, NT/ND CV - RRR Lungs - clear Ext - no edeam PV - deferred   [place] - polypoid mass in endometrium, 10mm intramural fibroid Normal adnexa  A/P:  PMB  Hysteroscopy, D&C R/b/a discussed, informed consent

## 2011-08-17 NOTE — Op Note (Signed)
Pre op dx:  Irregular PMB  Post op ZO:XWRU, path pending  Procedure: cervical block, hysteroscopy, D&C  Surgeon:  Zelphia Cairo  EBL:  minimal  Specimen:  EMC with polyp  Anesthesia:  general  Complications:  none  Condition:  stable  Pt was taken to the OR after informed consent.  Anesthesia was given and she was placed in dorsal lithotomy position.  Prepped and draped in sterile condition.  Bivalve speculum placed in vaginal and single tooth tenaculum placed on anterior lip of cervix.  Cervix was serially dilated with pratt dilators.  Diagnostic hysteroscope inserted and survey of intrauterine cavity performed.  Small polypoid mass noted at junction of internal cervical os and posterior uterus.   Hysteroscope removed and a gentle curetting performed.  Specimen placed on telfa and passed off to be sent to pathology.  Hysteroscope re-inserted & mass was noted to be removed.  Sponge, needle, and instrument counts correct.

## 2011-08-20 ENCOUNTER — Encounter (HOSPITAL_COMMUNITY): Payer: Self-pay | Admitting: Obstetrics and Gynecology

## 2011-08-20 NOTE — Anesthesia Postprocedure Evaluation (Signed)
Anesthesia Post Note  Patient: Angel Gordon  Procedure(s) Performed: Procedure(s) (LRB): DILATATION AND CURETTAGE /HYSTEROSCOPY (N/A)  Anesthesia type: GA  Patient location: PACU  Post pain: Pain level controlled  Post assessment: Post-op Vital signs reviewed  Last Vitals:  Filed Vitals:   08/17/11 0845  BP: 117/71  Pulse: 67  Temp: 36.4 C  Resp: 17    Post vital signs: Reviewed  Level of consciousness: sedated  Complications: No apparent anesthesia complications

## 2011-09-20 ENCOUNTER — Other Ambulatory Visit (INDEPENDENT_AMBULATORY_CARE_PROVIDER_SITE_OTHER): Payer: BC Managed Care – PPO

## 2011-09-20 ENCOUNTER — Ambulatory Visit (INDEPENDENT_AMBULATORY_CARE_PROVIDER_SITE_OTHER): Payer: BC Managed Care – PPO | Admitting: Internal Medicine

## 2011-09-20 ENCOUNTER — Encounter: Payer: Self-pay | Admitting: Internal Medicine

## 2011-09-20 VITALS — BP 110/72 | HR 72 | Temp 97.8°F | Resp 16 | Ht 59.0 in | Wt 177.0 lb

## 2011-09-20 DIAGNOSIS — Z0000 Encounter for general adult medical examination without abnormal findings: Secondary | ICD-10-CM

## 2011-09-20 DIAGNOSIS — E119 Type 2 diabetes mellitus without complications: Secondary | ICD-10-CM

## 2011-09-20 DIAGNOSIS — E669 Obesity, unspecified: Secondary | ICD-10-CM

## 2011-09-20 LAB — COMPREHENSIVE METABOLIC PANEL
ALT: 25 U/L (ref 0–35)
AST: 29 U/L (ref 0–37)
Albumin: 3.7 g/dL (ref 3.5–5.2)
Alkaline Phosphatase: 63 U/L (ref 39–117)
BUN: 10 mg/dL (ref 6–23)
CO2: 28 mEq/L (ref 19–32)
Calcium: 9.5 mg/dL (ref 8.4–10.5)
Chloride: 101 mEq/L (ref 96–112)
Creatinine, Ser: 1 mg/dL (ref 0.4–1.2)
GFR: 72.69 mL/min (ref >60.00)
Glucose, Bld: 81 mg/dL (ref 70–99)
Potassium: 3.9 mEq/L (ref 3.5–5.1)
Sodium: 138 mEq/L (ref 135–145)
Total Bilirubin: 0.7 mg/dL (ref 0.3–1.2)
Total Protein: 8.3 g/dL (ref 6.0–8.3)

## 2011-09-20 LAB — HEMOGLOBIN A1C: Hgb A1c MFr Bld: 6.7 % — ABNORMAL HIGH (ref 4.6–6.5)

## 2011-09-20 NOTE — Progress Notes (Signed)
Subjective:     Patient ID: Angel Gordon, female   DOB: January 09, 1956, 56 y.o.   MRN: [id number]  HPI Comments: Angel Gordon is a pleasant 55yo black woman with a history of DM2 who has come to the clinic today for a routine physical. She stated that she has been having pain in the left side of chest.The pain gets worse with inspiration, palpation, and walking. She describes it as a dull, gnawing pain. The pain does not radiate. She has applied rubbing alcohol to the area, but experienced no relief. She states she regularly sees a gynocologist and has had a recent breast exam. She has no other complaints or concerns.  Past Medical History  Diagnosis Date  . Type II or unspecified type diabetes mellitus without mention of complication, not stated as uncontrolled     metformin 500mg  bid-   Past Surgical History  Procedure Date  . Foot surgery 2002  . Tubal ligation   . Dilation and curettage of uterus   . Colonoscopy 2010  . Hysteroscopy w/d&c 08/17/2011    Procedure: DILATATION AND CURETTAGE /HYSTEROSCOPY;  Surgeon: Zelphia Cairo, MD;  Location: [facility];  Service: Gynecology;  Laterality: N/A;    No Known Allergies  Current Outpatient Prescriptions on File Prior to Visit  Medication Sig Dispense Refill  . glucose blood test strip 1 each by Other route as needed. Use as instructed       . metFORMIN (GLUCOPHAGE) 500 MG tablet Take 1 tablet (500 mg total) by mouth 2 (two) times daily.  30 tablet  5  . scopolamine (TRANSDERM-SCOP) 1.5 MG Place 1 patch onto the skin every 3 (three) days. Prn        . scopolamine (TRANSDERM-SCOP) 1.5 MG Place 1 patch (1.5 mg total) onto the skin every 3 (three) days.  4 patch  1  . acetaminophen (TYLENOL) 500 MG tablet Take 500 mg by mouth every 6 (six) hours as needed. For headache/general pain.      Marland Kitchen ibuprofen (ADVIL,MOTRIN) 200 MG tablet Take 200 mg by mouth every 6 (six) hours as needed. For headache/general pain      . metFORMIN (GLUCOPHAGE) 500 MG  tablet Take 1 tablet (500 mg total) by mouth 2 (two) times daily with a meal.  60 tablet  11   History   Social History  . Marital Status: Married    Spouse Name: N/A    Number of Children: 1  . Years of Education: 12+   Occupational History  .  Angel Gordon   Social History Main Topics  . Smoking status: Never Smoker   . Smokeless tobacco: Never Used  . Alcohol Use: No  . Drug Use: No  . Sexually Active: Yes    Birth Control/ Protection: None   Other Topics Concern  . Not on file   Social History Narrative   HSG, [employer] - classes, married '78 - 3 years, divorced; married '96. 1 son ' '79; 3 grandchildren. work: [place]     Review of Systems  All other systems reviewed and are negative.       Objective:   Physical Exam  Constitutional: She is oriented to person, place, and time. She appears well-developed and well-nourished. No distress.  HENT:  Head: Atraumatic.  Right Ear: External ear normal.  Left Ear: External ear normal.  Nose: Nose normal.  Mouth/Throat: Oropharynx is clear and moist. No oropharyngeal exudate.       Auditory canals clear. Tympanic membranes nl in both ears.  Signs of past dental work in mouth.  Eyes: Conjunctivae are normal. Pupils are equal, round, and reactive to light. Right eye exhibits no discharge. Left eye exhibits no discharge. No scleral icterus.  Neck: Normal range of motion. Neck supple. No thyromegaly present.       No carotid bruits. No swelling of postauricular lymph nodes.  Cardiovascular: Normal rate, regular rhythm, normal heart sounds and intact distal pulses.  Exam reveals no gallop and no friction rub.   No murmur heard.      Carotid pulse 2+ bl. Radial pulse 2+ bl. Dorsal pedalis pulse 1+ bl.  Pulmonary/Chest: Effort normal and breath sounds normal. No accessory muscle usage. Not tachypneic. No respiratory distress. She has no wheezes. She has no rhonchi. She has no rales.   She exhibits tenderness. She exhibits no  laceration, no deformity and no swelling.  Abdominal: Soft. Bowel sounds are normal. She exhibits no distension and no mass. There is no tenderness.       Lower liver margin at left costal boarder.  Musculoskeletal: She exhibits no edema and no tenderness.       No edema in periphery. No tenderness in extremities.  Neurological: She is alert and oriented to person, place, and time.       PT has sensation to vibration and pinprick in both of her feet.  Skin: Skin is warm. No rash noted. She is not diaphoretic. No cyanosis. No pallor. Nails show no clubbing.       PT's feet have no ulcers or lesions.  Psychiatric: She has a normal mood and affect. Her behavior is normal. Judgment and thought content normal.   Lab Results  Component Value Date   WBC 4.6 08/14/2011   HGB 12.1 08/14/2011   HCT 39.0 08/14/2011   PLT 217 08/14/2011   GLUCOSE 81 09/20/2011   CHOL 115 08/25/2010   TRIG 54.0 08/25/2010   HDL 75.00 08/25/2010   LDLCALC 29 08/25/2010   ALT 25 09/20/2011   AST 29 09/20/2011   NA 138 09/20/2011   K 3.9 09/20/2011   CL 101 09/20/2011   CREATININE 1.0 09/20/2011   BUN 10 09/20/2011   CO2 28 09/20/2011   TSH 3.20 08/25/2010   HGBA1C 6.7* 09/20/2011       Assessment & Plan:     1. Chest wall tenderness - point tenderness that increases on inspiration with no radiation or signs of trauma indicates muscular etiology.  Plan - PT refused corticosteroid injection at site. Advised patient to apply heat over the affected  Area. 2. Overdue TDap - PT refused all vaccinations because she is afraid of needles.  Plan - Advise patient of the benefits of vaccination and to return if she changes her mind. 3. Routine Exam - PT has a Hx of DM2 with no reported complications. Physical exam was benign. Her last HA1C was one year ago.  Plan - Obtain BMP and HA1C. No need for further action.    Attending note: patient interviewed and examined. Agree with assessment and plan as outlined above. See problem oriented  charting.

## 2011-09-22 NOTE — Assessment & Plan Note (Signed)
Lab Results  Component Value Date   HGBA1C 6.7* 09/20/2011   Good control: better than goal of 7% or less  Plan-  Continue present regimen.

## 2011-09-22 NOTE — Assessment & Plan Note (Signed)
Interval medical history is unremarkable except for chest wall pain over the past week(s). Physical exam, sans breast and pelvic, is remarkable for obesity. Lab results are OK. Cholesterol panels, '08, '10, '11 and '12 all very good with low total and LDL cholesterol. She is current with her gynecologist and with colorectal and breast cancer screening.  In summary - a very nice woman who needs to work on her weight but is medically stable and will continue on her present medications.

## 2011-09-22 NOTE — Assessment & Plan Note (Signed)
Patient advised of the critical role her weight plays in her health. Wt Readings from Last 3 Encounters:  09/20/11 177 lb (80.287 kg)  08/14/11 175 lb (79.379 kg)  04/02/11 174 lb (78.926 kg)   At 4'11" ideal body weight is 110-120 lbs.  Plan Weight management: smart food choices, PORTION SIZE CONTROL - palm of the hand as a guide, regular exercise  Target weight 135 lbs; goal - to loose 1-2 lbs/month ( a 3 year project).

## 2011-12-17 ENCOUNTER — Other Ambulatory Visit: Payer: Self-pay | Admitting: *Deleted

## 2011-12-17 MED ORDER — METFORMIN HCL 500 MG PO TABS: 500.0000 mg | ORAL_TABLET | Freq: Two times a day (BID) | ORAL | Status: DC

## 2011-12-17 NOTE — Telephone Encounter (Signed)
R'cd fax from [facility] for refill of Metformin.  

## 2012-03-15 ENCOUNTER — Encounter: Payer: Self-pay | Admitting: Family Medicine

## 2012-03-15 ENCOUNTER — Ambulatory Visit (INDEPENDENT_AMBULATORY_CARE_PROVIDER_SITE_OTHER): Payer: BC Managed Care – PPO | Admitting: Family Medicine

## 2012-03-15 VITALS — BP 106/70 | HR 80 | Temp 97.0°F | Resp 16 | Wt 175.0 lb

## 2012-03-15 DIAGNOSIS — N390 Urinary tract infection, site not specified: Secondary | ICD-10-CM

## 2012-03-15 DIAGNOSIS — J322 Chronic ethmoidal sinusitis: Secondary | ICD-10-CM

## 2012-03-15 MED ORDER — HYDROCODONE-HOMATROPINE 5-1.5 MG/5ML PO SYRP: 5.0000 mL | ORAL_SOLUTION | Freq: Three times a day (TID) | ORAL | Status: DC | PRN

## 2012-03-15 MED ORDER — AMOXICILLIN-POT CLAVULANATE 600-42.9 MG/5ML PO SUSR: 600.0000 mg | Freq: Two times a day (BID) | ORAL | Status: AC

## 2012-03-15 NOTE — Progress Notes (Signed)
SUBJECTIVE:  Angel Gordon is a 57 y.o. female who complains of coryza, congestion, sneezing and productive cough for 21 days. She denies a history of anorexia, chest pain and fevers and denies a history of asthma. Patient denies smoke cigarettes.   Patient Active Problem List  Diagnosis  . DIABETES MELLITUS  . FOOT SURGERY, HX OF  . Obesity (BMI 30-39.9)  . Routine health maintenance   Past Medical History  Diagnosis Date  . Type II or unspecified type diabetes mellitus without mention of complication, not stated as uncontrolled     metformin 500mg  bid-   Past Surgical History  Procedure Laterality Date  . Foot surgery  2002  . Tubal ligation    . Dilation and curettage of uterus    . Colonoscopy  2010  . Hysteroscopy w/d&c  08/17/2011    Procedure: DILATATION AND CURETTAGE /HYSTEROSCOPY;  Surgeon: Zelphia Cairo, MD;  Location: [facility];  Service: Gynecology;  Laterality: N/A;   History  Substance Use Topics  . Smoking status: Never Smoker   . Smokeless tobacco: Never Used  . Alcohol Use: No   Family History  Problem Relation Age of Onset  . Hypertension Mother   . Stroke Mother   . Kidney disease Mother   . Thyroid disease Mother   . Cancer Mother   . Cancer Maternal Grandmother     colon  . Kidney disease Maternal Grandmother   . Stroke Maternal Grandmother   . Hypertension Son    No Known Allergies Current Outpatient Prescriptions on File Prior to Visit  Medication Sig Dispense Refill  . acetaminophen (TYLENOL) 500 MG tablet Take 500 mg by mouth every 6 (six) hours as needed. For headache/general pain.      Marland Kitchen glucose blood test strip 1 each by Other route as needed. Use as instructed       . ibuprofen (ADVIL,MOTRIN) 200 MG tablet Take 200 mg by mouth every 6 (six) hours as needed. For headache/general pain      . metFORMIN (GLUCOPHAGE) 500 MG tablet Take 1 tablet (500 mg total) by mouth 2 (two) times daily.  60 tablet  5  . scopolamine (TRANSDERM-SCOP) 1.5 MG  Place 1 patch onto the skin every 3 (three) days. Prn         No current facility-administered medications on file prior to visit.   The PMH, PSH, Social History, Family History, Medications, and allergies have been reviewed in [facility], and have been updated if relevant.  OBJECTIVE: BP 106/70  Pulse 80  Temp(Src) 97 F (36.1 C) (Oral)  Resp 16  Wt 175 lb (79.379 kg)  BMI 35.33 kg/m2  SpO2 97%  LMP 02/23/2011  She appears well, vital signs are as noted. Ears normal.  Throat and pharynx normal.  Neck supple. No adenopathy in the neck. Nose is congested. Sinuses  tender. The chest is clear, without wheezes or rales.  ASSESSMENT:  sinusitis  PLAN: Given duration and progression of symptoms, will treat for bacterial sinusitis with 10 day course of Augmentin.  Symptomatic therapy suggested: Hycodan as needed for cough, push fluids, rest and return office visit prn if symptoms persist or worsen.  Call or return to clinic prn if these symptoms worsen or fail to improve as anticipated.

## 2012-03-15 NOTE — Patient Instructions (Addendum)
Take Augmentin as directed- twice daily for 10 days.  Drink lots of fluids.  Treat sympotmatically with Mucinex, nasal saline irrigation, and Tylenol/Ibuprofen.You can use warm compresses.  Cough suppressant at night. Call if not improving as expected in 5-7 days.

## 2012-06-17 ENCOUNTER — Telehealth: Payer: Self-pay

## 2012-06-17 NOTE — Telephone Encounter (Signed)
Phone call to patient letting her know a fax from [facility] was received stating she may have stopped taking her Metformin 500 mg. I spoke with patient and she states she is taking it as prescribed. I let her know she was last seen Aug last year so in the next few months she will be due for an office visit.

## 2012-09-03 ENCOUNTER — Encounter: Payer: Self-pay | Admitting: Internal Medicine

## 2012-09-03 ENCOUNTER — Ambulatory Visit (INDEPENDENT_AMBULATORY_CARE_PROVIDER_SITE_OTHER)
Admission: RE | Admit: 2012-09-03 | Discharge: 2012-09-03 | Disposition: A | Discharge status: Home / Self Care | Payer: BC Managed Care – PPO | Source: Ambulatory Visit | Attending: Internal Medicine | Admitting: Internal Medicine

## 2012-09-03 ENCOUNTER — Ambulatory Visit (INDEPENDENT_AMBULATORY_CARE_PROVIDER_SITE_OTHER): Payer: BC Managed Care – PPO | Admitting: Internal Medicine

## 2012-09-03 VITALS — BP 132/80 | HR 76 | Temp 97.6°F | Wt 183.4 lb

## 2012-09-03 DIAGNOSIS — M25519 Pain in unspecified shoulder: Secondary | ICD-10-CM

## 2012-09-03 DIAGNOSIS — M25511 Pain in right shoulder: Secondary | ICD-10-CM

## 2012-09-03 NOTE — Patient Instructions (Addendum)
Pain n the right arm is atypical for heart related pain. Also, no exertional pain, radiation of pain, shortness of breath or heavy sweats with pain. There is some shoulder tenderness but with the whole arm hurting we need to look at you neck for arthritis or disk disease.  Plan C-Spine x-rays.  Continue medications from Mr. Ronne Binning.  For arm pain that radiates, that also has chest pain, shortness or breath, sweats - CALL for immediate evaluation.

## 2012-09-03 NOTE — Progress Notes (Signed)
Subjective:    Patient ID: Angel Gordon, female    DOB: 07-24-1955, 57 y.o.   MRN: [id number]  HPI Angel Gordon presents for right arm and shoulder pain. She has seen ortho - [facility] PA, told she had arthritis and inflammation .She reports an aching pain, pinching pain from neck to hand. She feels like there is tightness in the arm. She has a high level of concern about heart disease with close family members have heart attacks. NO exertional pain, no SOB, no diaphoresis, no radiation to the jaw. The medications she has tried, NSAIDs, do not help. NO record of injury, overuse or strain.  Past Medical History  Diagnosis Date  . Type II or unspecified type diabetes mellitus without mention of complication, not stated as uncontrolled     metformin 500mg  bid-   Past Surgical History  Procedure Laterality Date  . Foot surgery  2002  . Tubal ligation    . Dilation and curettage of uterus    . Colonoscopy  2010  . Hysteroscopy w/d&c  08/17/2011    Procedure: DILATATION AND CURETTAGE /HYSTEROSCOPY;  Surgeon: Zelphia Cairo, MD;  Location: [facility];  Service: Gynecology;  Laterality: N/A;   Family History  Problem Relation Age of Onset  . Hypertension Mother   . Stroke Mother   . Kidney disease Mother   . Thyroid disease Mother   . Cancer Mother   . Cancer Maternal Grandmother     colon  . Kidney disease Maternal Grandmother   . Stroke Maternal Grandmother   . Hypertension Son    History   Social History  . Marital Status: Married    Spouse Name: N/A    Number of Children: 1  . Years of Education: 12+   Occupational History  .  [employer]Duty   Social History Main Topics  . Smoking status: Never Smoker   . Smokeless tobacco: Never Used  . Alcohol Use: No  . Drug Use: No  . Sexual Activity: Yes    Birth Control/ Protection: None   Other Topics Concern  . Not on file   Social History Narrative   HSG, [employer] - classes, married '78 - 3 years, divorced; married '96. 1 son ' '79;  3 grandchildren. work: [place]    Current Outpatient Prescriptions on File Prior to Visit  Medication Sig Dispense Refill  . acetaminophen (TYLENOL) 500 MG tablet Take 500 mg by mouth every 6 (six) hours as needed. For headache/general pain.      Marland Kitchen glucose blood test strip 1 each by Other route as needed. Use as instructed       . HYDROcodone-homatropine (HYCODAN) 5-1.5 MG/5ML syrup Take 5 mLs by mouth every 8 (eight) hours as needed for cough.  240 mL  0  . ibuprofen (ADVIL,MOTRIN) 200 MG tablet Take 200 mg by mouth every 6 (six) hours as needed. For headache/general pain      . metFORMIN (GLUCOPHAGE) 500 MG tablet Take 1 tablet (500 mg total) by mouth 2 (two) times daily.  60 tablet  5  . scopolamine (TRANSDERM-SCOP) 1.5 MG Place 1 patch onto the skin every 3 (three) days. Prn         No current facility-administered medications on file prior to visit.      Review of Systems System review is negative for any constitutional, cardiac, pulmonary, GI or neuro symptoms or complaints other than as described in the HPI.     Objective:   Physical Exam Filed Vitals:  09/03/12 1448  BP: 132/80  Pulse: 76  Temp: 97.6 F (36.4 C)   Wt Readings from Last 3 Encounters:  09/03/12 183 lb 6.4 oz (83.19 kg)  03/15/12 175 lb (79.379 kg)  09/20/11 177 lb (80.287 kg)   Gen'l- overweight AA woman in no distress Cor - 2+ radial, quiet precordium, RRR, no JVD Pulm - CTAP MSK- good grip, normal proximal strength, full passive ROM right shoulder with minimal pain. With extension and rotation of neck to right sharp pain.  12 lead EKG with PVC no evidence of ischemia       Assessment & Plan:  Pain n the right arm is atypical for heart related pain. Also, no exertional pain, radiation of pain, shortness of breath or heavy sweats with pain. There is some shoulder tenderness but with the whole arm hurting we need to look at you neck for arthritis or disk disease.  Plan C-Spine  x-rays.  Continue medications from Mr. Ronne Binning.  For arm pain that radiates, that also has chest pain, shortness or breath, sweats - CALL for immediate evaluation.

## 2012-09-04 ENCOUNTER — Encounter: Payer: Self-pay | Admitting: Internal Medicine

## 2012-09-04 ENCOUNTER — Other Ambulatory Visit: Payer: Self-pay | Admitting: Internal Medicine

## 2012-09-04 DIAGNOSIS — M79601 Pain in right arm: Secondary | ICD-10-CM

## 2012-09-16 ENCOUNTER — Ambulatory Visit: Payer: BC Managed Care – PPO | Attending: Internal Medicine | Admitting: Physical Therapy

## 2012-09-16 DIAGNOSIS — M25519 Pain in unspecified shoulder: Secondary | ICD-10-CM | POA: Insufficient documentation

## 2012-09-16 DIAGNOSIS — R609 Edema, unspecified: Secondary | ICD-10-CM | POA: Insufficient documentation

## 2012-09-16 DIAGNOSIS — IMO0001 Reserved for inherently not codable concepts without codable children: Secondary | ICD-10-CM | POA: Insufficient documentation

## 2012-09-16 DIAGNOSIS — M542 Cervicalgia: Secondary | ICD-10-CM | POA: Insufficient documentation

## 2012-09-16 DIAGNOSIS — R293 Abnormal posture: Secondary | ICD-10-CM | POA: Insufficient documentation

## 2012-09-24 ENCOUNTER — Other Ambulatory Visit (INDEPENDENT_AMBULATORY_CARE_PROVIDER_SITE_OTHER): Payer: BC Managed Care – PPO

## 2012-09-24 ENCOUNTER — Encounter: Payer: Self-pay | Admitting: Internal Medicine

## 2012-09-24 ENCOUNTER — Ambulatory Visit (INDEPENDENT_AMBULATORY_CARE_PROVIDER_SITE_OTHER): Payer: BC Managed Care – PPO | Admitting: Internal Medicine

## 2012-09-24 VITALS — BP 120/82 | HR 86 | Temp 96.7°F | Ht 59.0 in | Wt 179.0 lb

## 2012-09-24 DIAGNOSIS — Z0000 Encounter for general adult medical examination without abnormal findings: Secondary | ICD-10-CM

## 2012-09-24 DIAGNOSIS — E669 Obesity, unspecified: Secondary | ICD-10-CM

## 2012-09-24 DIAGNOSIS — E119 Type 2 diabetes mellitus without complications: Secondary | ICD-10-CM

## 2012-09-24 LAB — LIPID PANEL
Cholesterol: 115 mg/dL (ref 0–200)
HDL: 70.2 mg/dL (ref >39.00)
LDL Cholesterol: 36 mg/dL (ref 0–99)
Total CHOL/HDL Ratio: 2
Triglycerides: 43 mg/dL (ref 0.0–149.0)
VLDL: 8.6 mg/dL (ref 0.0–40.0)

## 2012-09-24 LAB — COMPREHENSIVE METABOLIC PANEL
ALT: 24 U/L (ref 0–35)
AST: 25 U/L (ref 0–37)
Albumin: 3.9 g/dL (ref 3.5–5.2)
Alkaline Phosphatase: 67 U/L (ref 39–117)
BUN: 19 mg/dL (ref 6–23)
CO2: 29 mEq/L (ref 19–32)
Calcium: 9.9 mg/dL (ref 8.4–10.5)
Chloride: 102 mEq/L (ref 96–112)
Creatinine, Ser: 0.9 mg/dL (ref 0.4–1.2)
GFR: 85.86 mL/min (ref >60.00)
Glucose, Bld: 87 mg/dL (ref 70–99)
Potassium: 3.8 mEq/L (ref 3.5–5.1)
Sodium: 136 mEq/L (ref 135–145)
Total Bilirubin: 0.3 mg/dL (ref 0.3–1.2)
Total Protein: 8.9 g/dL — ABNORMAL HIGH (ref 6.0–8.3)

## 2012-09-24 LAB — HEMOGLOBIN A1C: Hgb A1c MFr Bld: 7 % — ABNORMAL HIGH (ref 4.6–6.5)

## 2012-09-24 LAB — HEMOGLOBIN AND HEMATOCRIT, BLOOD
HCT: 40.7 % (ref 36.0–46.0)
Hemoglobin: 13.2 g/dL (ref 12.0–15.0)

## 2012-09-24 NOTE — Progress Notes (Signed)
Subjective:    Patient ID: Angel Gordon, female    DOB: 1955-12-10, 57 y.o.   MRN: [id number]  HPI Angel Gordon presents for a general wellness exam. She was last seen Aug 13th for arm pain. Subsequent c-spin revealed DDD/DJD cervical spine and she has been referred to PT. The arm pain is a little better and she will be returning for additional treatments.   She has seen Dr. Chevis Pretty for gyn.  She has been to dentist and eye doctor. She is trying to exercise: she walks 45 min every day. She follows a healthy diet.   Past Medical History  Diagnosis Date  . Type II or unspecified type diabetes mellitus without mention of complication, not stated as uncontrolled     metformin 500mg  bid-   Past Surgical History  Procedure Laterality Date  . Foot surgery  2002  . Tubal ligation    . Dilation and curettage of uterus    . Colonoscopy  2010  . Hysteroscopy w/d&c  08/17/2011    Procedure: DILATATION AND CURETTAGE /HYSTEROSCOPY;  Surgeon: Zelphia Cairo, MD;  Location: [facility];  Service: Gynecology;  Laterality: N/A;   Family History  Problem Relation Age of Onset  . Hypertension Mother   . Stroke Mother   . Kidney disease Mother   . Thyroid disease Mother   . Cancer Mother   . Cancer Maternal Grandmother     colon  . Kidney disease Maternal Grandmother   . Stroke Maternal Grandmother   . Hypertension Son    History   Social History  . Marital Status: Married    Spouse Name: N/A    Number of Children: 1  . Years of Education: 12+   Occupational History  .  [employer]Duty   Social History Main Topics  . Smoking status: Never Smoker   . Smokeless tobacco: Never Used  . Alcohol Use: No  . Drug Use: No  . Sexual Activity: Yes    Birth Control/ Protection: None   Other Topics Concern  . Not on file   Social History Narrative   HSG, [employer] - classes, married '78 - 3 years, divorced; married '96. 1 son ' '79; 3 grandchildren. work: [place]    Current Outpatient Prescriptions  on File Prior to Visit  Medication Sig Dispense Refill  . acetaminophen (TYLENOL) 500 MG tablet Take 500 mg by mouth every 6 (six) hours as needed. For headache/general pain.      Marland Kitchen glucose blood test strip 1 each by Other route as needed. Use as instructed       . HYDROcodone-homatropine (HYCODAN) 5-1.5 MG/5ML syrup Take 5 mLs by mouth every 8 (eight) hours as needed for cough.  240 mL  0  . ibuprofen (ADVIL,MOTRIN) 200 MG tablet Take 200 mg by mouth every 6 (six) hours as needed. For headache/general pain      . metFORMIN (GLUCOPHAGE) 500 MG tablet Take 1 tablet (500 mg total) by mouth 2 (two) times daily.  60 tablet  5  . scopolamine (TRANSDERM-SCOP) 1.5 MG Place 1 patch onto the skin every 3 (three) days. Prn         No current facility-administered medications on file prior to visit.      Review of Systems System review is negative for any constitutional, cardiac, pulmonary, GI or neuro symptoms or complaints other than as described in the HPI.     Objective:   Physical Exam Filed Vitals:   09/24/12 1440  BP: 120/82  Pulse: 86  Temp: 96.7 F (35.9 C)   Wt Readings from Last 3 Encounters:  09/24/12 179 lb (81.194 kg)  09/03/12 183 lb 6.4 oz (83.19 kg)  03/15/12 175 lb (79.379 kg)   Gen'l: well nourished, well developed AA Woman in no distress HEENT - [place]/AT, EACs/TMs normal, oropharynx with native dentition in good condition, no buccal or palatal lesions, posterior pharynx clear, mucous membranes moist. C&S clear, PERRLA, fundi - normal Neck - supple, no thyromegaly Nodes- negative submental, cervical, supraclavicular regions Chest - no deformity, no CVAT Lungs - clear without rales, wheezes. No increased work of breathing Breast - deferred to gyn Cardiovascular - regular rate and rhythm, quiet precordium, no murmurs, rubs or gallops, 2+ radial, DP and PT pulses Abdomen - BS+ x 4, no HSM, no guarding or rebound or tenderness Pelvic - deferred to gyn Rectal - deferred to  gyn Extremities - no clubbing, cyanosis, edema or deformity.  Neuro - A&O x 3, CN II-XII normal, motor strength normal and equal, DTRs 2+ and symmetrical biceps, radial, and patellar tendons. Cerebellar - no tremor, no rigidity, fluid movement and normal gait. Derm - Head, neck, back, abdomen and extremities without suspicious lesions  Recent Results (from the past 2160 hour(s))  HEMOGLOBIN A1C     Status: Abnormal   Collection Time    09/24/12  4:15 PM      Result Value Range   Hemoglobin A1C 7.0 (*) 4.6 - 6.5 %   Comment: Glycemic Control Guidelines for People with Diabetes:Non Diabetic:  <6%Goal of Therapy: <7%Additional Action Suggested:  >8%   COMPREHENSIVE METABOLIC PANEL     Status: Abnormal   Collection Time    09/24/12  4:15 PM      Result Value Range   Sodium 136  135 - 145 mEq/L   Potassium 3.8  3.5 - 5.1 mEq/L   Chloride 102  96 - 112 mEq/L   CO2 29  19 - 32 mEq/L   Glucose, Bld 87  70 - 99 mg/dL   BUN 19  6 - 23 mg/dL   Creatinine, Ser 0.9  0.4 - 1.2 mg/dL   Total Bilirubin 0.3  0.3 - 1.2 mg/dL   Alkaline Phosphatase 67  39 - 117 U/L   AST 25  0 - 37 U/L   ALT 24  0 - 35 U/L   Total Protein 8.9 (*) 6.0 - 8.3 g/dL   Albumin 3.9  3.5 - 5.2 g/dL   Calcium 9.9  8.4 - 16.1 mg/dL   GFR 09.60  >45.40 mL/min  LIPID PANEL     Status: None   Collection Time    09/24/12  4:15 PM      Result Value Range   Cholesterol 115  0 - 200 mg/dL   Comment: ATP III Classification       Desirable:  < 200 mg/dL               Borderline High:  200 - 239 mg/dL          High:  > = 981 mg/dL   Triglycerides 19.1  0.0 - 149.0 mg/dL   Comment: Normal:  <478 mg/dLBorderline High:  150 - 199 mg/dL   HDL 29.56  >21.30 mg/dL   VLDL 8.6  0.0 - 86.5 mg/dL   LDL Cholesterol 36  0 - 99 mg/dL   Total CHOL/HDL Ratio 2     Comment:  Men          Women1/2 Average Risk     3.4          3.3Average Risk          5.0          4.42X Average Risk          9.6          7.13X Average Risk           15.0          11.0                      HEMOGLOBIN AND HEMATOCRIT, BLOOD     Status: None   Collection Time    09/24/12  4:15 PM      Result Value Range   Hemoglobin 13.2  12.0 - 15.0 g/dL   HCT 47.8  29.5 - 62.1 %         Assessment & Plan:

## 2012-09-24 NOTE — Patient Instructions (Addendum)
Good to see you. Keep up the Physical therapy for the shoulder pain from your neck.  Exam to day is normal  For lab today,results will be on MyChart  Health maintenance is up to date for now.  See you as needed of if there is a problem with the lab work.

## 2012-09-25 ENCOUNTER — Encounter: Payer: Self-pay | Admitting: Internal Medicine

## 2012-09-27 NOTE — Assessment & Plan Note (Signed)
Lab Results  Component Value Date   HGBA1C 7.0* 09/24/2012   At goal!  Plan Continue present medication  Continue to work on weight issues via diet and exercise.

## 2012-09-27 NOTE — Assessment & Plan Note (Signed)
Interval history notable for shoulder pain for which she is participating in Physical therapy. Limited exam is normal except for weight. She is current with her OB/Gyn. Colorectal and breast cancer screening is current. Immunization is current.  In summary A nice woman who needs to loose weight. She will return in 6 months for interim check

## 2012-09-27 NOTE — Assessment & Plan Note (Signed)
Body mass index is 36.13 kg/(m^2). Patient counseled about weight being her #1 health problem!!  Plan Diet management: smart food choices, PORTION SIZE CONTROL, regular exercise. Goal - to loose 1-2 lbs.month. Target weight - 150 lbs

## 2012-09-29 ENCOUNTER — Ambulatory Visit: Payer: BC Managed Care – PPO | Admitting: Physical Therapy

## 2012-10-01 ENCOUNTER — Ambulatory Visit: Payer: BC Managed Care – PPO | Attending: Internal Medicine | Admitting: Physical Therapy

## 2012-10-01 DIAGNOSIS — M25519 Pain in unspecified shoulder: Secondary | ICD-10-CM | POA: Insufficient documentation

## 2012-10-01 DIAGNOSIS — IMO0001 Reserved for inherently not codable concepts without codable children: Secondary | ICD-10-CM | POA: Insufficient documentation

## 2012-10-01 DIAGNOSIS — M542 Cervicalgia: Secondary | ICD-10-CM | POA: Insufficient documentation

## 2012-10-01 DIAGNOSIS — R609 Edema, unspecified: Secondary | ICD-10-CM | POA: Insufficient documentation

## 2012-10-01 DIAGNOSIS — R293 Abnormal posture: Secondary | ICD-10-CM | POA: Insufficient documentation

## 2012-10-20 ENCOUNTER — Ambulatory Visit: Payer: BC Managed Care – PPO | Admitting: Physical Therapy

## 2012-10-22 ENCOUNTER — Telehealth: Payer: Self-pay | Admitting: *Deleted

## 2012-10-22 NOTE — Telephone Encounter (Signed)
Pt states orthotics squeak and don't fit into shoes well, can they be cut down? I informed pt that they may be able to be trimmed and to rub bar soap on hard portion of the orthotic to remedy the squeak. I told pt to come by on a Tuesday to see Morrie Sheldon to discuss.

## 2012-10-24 DIAGNOSIS — M7660 Achilles tendinitis, unspecified leg: Secondary | ICD-10-CM

## 2012-10-27 ENCOUNTER — Other Ambulatory Visit: Payer: Self-pay | Admitting: *Deleted

## 2012-10-27 MED ORDER — MELOXICAM 15 MG PO TABS: 15.0000 mg | ORAL_TABLET | Freq: Every day | ORAL | Status: DC

## 2012-10-27 NOTE — Telephone Encounter (Signed)
Refill request for Mobic done.

## 2012-11-27 ENCOUNTER — Other Ambulatory Visit: Payer: Self-pay

## 2013-01-22 ENCOUNTER — Other Ambulatory Visit: Payer: Self-pay | Admitting: Internal Medicine

## 2013-03-18 ENCOUNTER — Other Ambulatory Visit: Payer: Self-pay | Admitting: Otolaryngology

## 2013-03-18 DIAGNOSIS — R229 Localized swelling, mass and lump, unspecified: Principal | ICD-10-CM

## 2013-03-18 DIAGNOSIS — IMO0002 Reserved for concepts with insufficient information to code with codable children: Secondary | ICD-10-CM

## 2013-03-25 ENCOUNTER — Ambulatory Visit
Admission: RE | Admit: 2013-03-25 | Discharge: 2013-03-25 | Disposition: A | Discharge status: Home / Self Care | Payer: BC Managed Care – PPO | Source: Ambulatory Visit | Attending: Otolaryngology | Admitting: Otolaryngology

## 2013-03-25 DIAGNOSIS — R229 Localized swelling, mass and lump, unspecified: Principal | ICD-10-CM

## 2013-03-25 DIAGNOSIS — IMO0002 Reserved for concepts with insufficient information to code with codable children: Secondary | ICD-10-CM

## 2013-04-06 ENCOUNTER — Other Ambulatory Visit: Payer: Self-pay | Admitting: Otolaryngology

## 2013-04-06 ENCOUNTER — Ambulatory Visit
Admission: RE | Admit: 2013-04-06 | Discharge: 2013-04-06 | Disposition: A | Discharge status: Home / Self Care | Payer: BC Managed Care – PPO | Source: Ambulatory Visit | Attending: Otolaryngology | Admitting: Otolaryngology

## 2013-04-06 DIAGNOSIS — R229 Localized swelling, mass and lump, unspecified: Secondary | ICD-10-CM

## 2013-04-06 DIAGNOSIS — IMO0002 Reserved for concepts with insufficient information to code with codable children: Secondary | ICD-10-CM

## 2013-04-30 ENCOUNTER — Other Ambulatory Visit: Payer: Self-pay | Admitting: Otolaryngology

## 2013-06-03 ENCOUNTER — Other Ambulatory Visit: Payer: Self-pay | Admitting: Gynecology

## 2013-07-28 ENCOUNTER — Other Ambulatory Visit: Payer: Self-pay

## 2013-07-28 MED ORDER — METFORMIN HCL 500 MG PO TABS: ORAL_TABLET | ORAL | Status: DC

## 2013-08-06 LAB — HM COLONOSCOPY: HM Colonoscopy: NORMAL

## 2013-08-06 LAB — HM MAMMOGRAPHY: HM Mammogram: NORMAL

## 2013-09-29 ENCOUNTER — Encounter: Payer: BC Managed Care – PPO | Admitting: Internal Medicine

## 2013-10-05 ENCOUNTER — Ambulatory Visit (INDEPENDENT_AMBULATORY_CARE_PROVIDER_SITE_OTHER): Payer: BC Managed Care – PPO | Admitting: Internal Medicine

## 2013-10-05 ENCOUNTER — Encounter: Payer: Self-pay | Admitting: Internal Medicine

## 2013-10-05 ENCOUNTER — Other Ambulatory Visit (INDEPENDENT_AMBULATORY_CARE_PROVIDER_SITE_OTHER): Payer: BC Managed Care – PPO

## 2013-10-05 ENCOUNTER — Encounter: Payer: BC Managed Care – PPO | Admitting: Internal Medicine

## 2013-10-05 ENCOUNTER — Encounter: Payer: Self-pay | Admitting: Gastroenterology

## 2013-10-05 VITALS — BP 120/86 | HR 71 | Temp 97.5°F | Resp 16 | Ht 59.0 in | Wt 180.8 lb

## 2013-10-05 DIAGNOSIS — E1165 Type 2 diabetes mellitus with hyperglycemia: Principal | ICD-10-CM

## 2013-10-05 DIAGNOSIS — IMO0001 Reserved for inherently not codable concepts without codable children: Secondary | ICD-10-CM

## 2013-10-05 DIAGNOSIS — N390 Urinary tract infection, site not specified: Secondary | ICD-10-CM

## 2013-10-05 DIAGNOSIS — Z0000 Encounter for general adult medical examination without abnormal findings: Secondary | ICD-10-CM

## 2013-10-05 LAB — CBC WITH DIFFERENTIAL/PLATELET
Basophils Absolute: 0 10*3/uL (ref 0.0–0.1)
Basophils Relative: 0.4 % (ref 0.0–3.0)
Eosinophils Absolute: 0.5 10*3/uL (ref 0.0–0.7)
Eosinophils Relative: 6.2 % — ABNORMAL HIGH (ref 0.0–5.0)
HCT: 38.2 % (ref 36.0–46.0)
Hemoglobin: 12.3 g/dL (ref 12.0–15.0)
Lymphocytes Relative: 46.5 % — ABNORMAL HIGH (ref 12.0–46.0)
Lymphs Abs: 3.4 10*3/uL (ref 0.7–4.0)
MCHC: 32.1 g/dL (ref 30.0–36.0)
MCV: 86.5 fl (ref 78.0–100.0)
Monocytes Absolute: 0.6 10*3/uL (ref 0.1–1.0)
Monocytes Relative: 7.9 % (ref 3.0–12.0)
Neutro Abs: 2.9 10*3/uL (ref 1.4–7.7)
Neutrophils Relative %: 39 % — ABNORMAL LOW (ref 43.0–77.0)
Platelets: 231 10*3/uL (ref 150.0–400.0)
RBC: 4.41 Mil/uL (ref 3.87–5.11)
RDW: 13.9 % (ref 11.5–15.5)
WBC: 7.3 10*3/uL (ref 4.0–10.5)

## 2013-10-05 LAB — COMPREHENSIVE METABOLIC PANEL
ALT: 26 U/L (ref 0–35)
AST: 25 U/L (ref 0–37)
Albumin: 3.5 g/dL (ref 3.5–5.2)
Alkaline Phosphatase: 73 U/L (ref 39–117)
BUN: 10 mg/dL (ref 6–23)
CO2: 26 mEq/L (ref 19–32)
Calcium: 9.6 mg/dL (ref 8.4–10.5)
Chloride: 103 mEq/L (ref 96–112)
Creatinine, Ser: 1 mg/dL (ref 0.4–1.2)
GFR: 77.37 mL/min (ref >60.00)
Glucose, Bld: 106 mg/dL — ABNORMAL HIGH (ref 70–99)
Potassium: 4.1 mEq/L (ref 3.5–5.1)
Sodium: 139 mEq/L (ref 135–145)
Total Bilirubin: 0.3 mg/dL (ref 0.2–1.2)
Total Protein: 8.3 g/dL (ref 6.0–8.3)

## 2013-10-05 LAB — LIPID PANEL
Cholesterol: 106 mg/dL (ref 0–200)
HDL: 56.6 mg/dL (ref >39.00)
LDL Cholesterol: 43 mg/dL (ref 0–99)
NonHDL: 49.4
Total CHOL/HDL Ratio: 2
Triglycerides: 30 mg/dL (ref 0.0–149.0)
VLDL: 6 mg/dL (ref 0.0–40.0)

## 2013-10-05 LAB — URINALYSIS, ROUTINE W REFLEX MICROSCOPIC
Bilirubin Urine: NEGATIVE
Ketones, ur: NEGATIVE
Leukocytes, UA: NEGATIVE
Nitrite: NEGATIVE
Specific Gravity, Urine: <=1.005 — AB (ref 1.000–1.030)
Total Protein, Urine: NEGATIVE
Urine Glucose: NEGATIVE
Urobilinogen, UA: 0.2 (ref 0.0–1.0)
pH: 6 (ref 5.0–8.0)

## 2013-10-05 LAB — MICROALBUMIN / CREATININE URINE RATIO
Creatinine,U: 68.3 mg/dL
Microalb Creat Ratio: 4.1 mg/g (ref 0.0–30.0)
Microalb, Ur: 2.8 mg/dL — ABNORMAL HIGH (ref 0.0–1.9)

## 2013-10-05 LAB — HEMOGLOBIN A1C: Hgb A1c MFr Bld: 7 % — ABNORMAL HIGH (ref 4.6–6.5)

## 2013-10-05 LAB — TSH: TSH: 2.7 u[IU]/mL (ref 0.35–4.50)

## 2013-10-05 NOTE — Progress Notes (Signed)
Subjective:    Patient ID: Angel Gordon, female    DOB: 1955-02-01, 57 y.o.   MRN: [id number]  Urinary Tract Infection  This is a recurrent problem. The current episode started in the past 7 days. The problem occurs every urination. The problem has been unchanged. The quality of the pain is described as burning. The pain is at a severity of 2/10. The pain is mild. There has been no fever. The fever has been present for less than 1 day. She is sexually active. There is no history of pyelonephritis. Associated symptoms include urgency. Pertinent negatives include no chills, discharge, flank pain, frequency, hematuria, hesitancy, nausea, sweats or vomiting. She has tried nothing for the symptoms. The treatment provided no relief. There is no history of catheterization, kidney stones, recurrent UTIs, a single kidney, urinary stasis or a urological procedure.      Review of Systems  Constitutional: Negative.  Negative for fever, chills, diaphoresis, appetite change and fatigue.  HENT: Negative.   Eyes: Negative.   Respiratory: Negative.  Negative for cough, choking, chest tightness, shortness of breath and stridor.   Cardiovascular: Negative.  Negative for chest pain, palpitations and leg swelling.  Gastrointestinal: Negative.  Negative for nausea, vomiting, abdominal pain, diarrhea and constipation.  Endocrine: Negative.  Negative for polydipsia, polyphagia and polyuria.  Genitourinary: Positive for dysuria and urgency. Negative for hesitancy, frequency, hematuria, flank pain, decreased urine volume, vaginal bleeding, vaginal discharge, enuresis, difficulty urinating, genital sores, vaginal pain, pelvic pain and dyspareunia.  Musculoskeletal: Negative.  Negative for arthralgias, back pain, joint swelling and myalgias.  Skin: Negative.  Negative for rash.  Allergic/Immunologic: Negative.   Neurological: Negative.  Negative for dizziness, tremors, syncope, light-headedness, numbness and  headaches.  Hematological: Negative.  Negative for adenopathy. Does not bruise/bleed easily.  Psychiatric/Behavioral: Negative.        Objective:   Physical Exam  Vitals reviewed. Constitutional: She is oriented to person, place, and time. She appears well-developed and well-nourished. No distress.  HENT:  Head: Normocephalic and atraumatic.  Mouth/Throat: Oropharynx is clear and moist. No oropharyngeal exudate.  Eyes: Conjunctivae are normal. Right eye exhibits no discharge. Left eye exhibits no discharge. No scleral icterus.  Neck: Normal range of motion. Neck supple. No JVD present. No tracheal deviation present. No thyromegaly present.  Cardiovascular: Normal rate, regular rhythm, normal heart sounds and intact distal pulses.  Exam reveals no gallop and no friction rub.   No murmur heard. Pulmonary/Chest: Effort normal and breath sounds normal. No stridor. No respiratory distress. She has no wheezes. She has no rales. She exhibits no tenderness.  Abdominal: Soft. Bowel sounds are normal. She exhibits no distension and no mass. There is no tenderness. There is no rebound and no guarding.  Musculoskeletal: Normal range of motion. She exhibits no edema and no tenderness.  Lymphadenopathy:    She has no cervical adenopathy.  Neurological: She is oriented to person, place, and time.  Skin: Skin is warm and dry. No rash noted. She is not diaphoretic. No erythema. No pallor.  Psychiatric: She has a normal mood and affect. Her behavior is normal. Judgment and thought content normal.     Lab Results  Component Value Date   WBC 4.6 08/14/2011   HGB 13.2 09/24/2012   HCT 40.7 09/24/2012   PLT 217 08/14/2011   GLUCOSE 87 09/24/2012   CHOL 115 09/24/2012   TRIG 43.0 09/24/2012   HDL 70.20 09/24/2012   LDLCALC 36 09/24/2012   ALT 24 09/24/2012  AST 25 09/24/2012   NA 136 09/24/2012   K 3.8 09/24/2012   CL 102 09/24/2012   CREATININE 0.9 09/24/2012   BUN 19 09/24/2012   CO2 29 09/24/2012   TSH 3.20 08/25/2010    HGBA1C 7.0* 09/24/2012       Assessment & Plan:

## 2013-10-05 NOTE — Progress Notes (Signed)
Pre visit review using our clinic review tool, if applicable. No additional management support is needed unless otherwise documented below in the visit note. 

## 2013-10-05 NOTE — Patient Instructions (Signed)
Preventive Care for Adults A healthy lifestyle and preventive care can promote health and wellness. Preventive health guidelines for women include the following key practices.  A routine yearly physical is a good way to check with your health care provider about your health and preventive screening. It is a chance to share any concerns and updates on your health and to receive a thorough exam.  Visit your dentist for a routine exam and preventive care every 6 months. Brush your teeth twice a day and floss once a day. Good oral hygiene prevents tooth decay and gum disease.  The frequency of eye exams is based on your age, health, family medical history, use of contact lenses, and other factors. Follow your health care provider's recommendations for frequency of eye exams.  Eat a healthy diet. Foods like vegetables, fruits, whole grains, low-fat dairy products, and lean protein foods contain the nutrients you need without too many calories. Decrease your intake of foods high in solid fats, added sugars, and salt. Eat the right amount of calories for you.Get information about a proper diet from your health care provider, if necessary.  Regular physical exercise is one of the most important things you can do for your health. Most adults should get at least 150 minutes of moderate-intensity exercise (any activity that increases your heart rate and causes you to sweat) each week. In addition, most adults need muscle-strengthening exercises on 2 or more days a week.  Maintain a healthy weight. The body mass index (BMI) is a screening tool to identify possible weight problems. It provides an estimate of body fat based on height and weight. Your health care provider can find your BMI and can help you achieve or maintain a healthy weight.For adults 20 years and older:  A BMI below 18.5 is considered underweight.  A BMI of 18.5 to 24.9 is normal.  A BMI of 25 to 29.9 is considered overweight.  A BMI of  30 and above is considered obese.  Maintain normal blood lipids and cholesterol levels by exercising and minimizing your intake of saturated fat. Eat a balanced diet with plenty of fruit and vegetables. Blood tests for lipids and cholesterol should begin at age 76 and be repeated every 5 years. If your lipid or cholesterol levels are high, you are over 50, or you are at high risk for heart disease, you may need your cholesterol levels checked more frequently.Ongoing high lipid and cholesterol levels should be treated with medicines if diet and exercise are not working.  If you smoke, find out from your health care provider how to quit. If you do not use tobacco, do not start.  Lung cancer screening is recommended for adults aged 22-80 years who are at high risk for developing lung cancer because of a history of smoking. A yearly low-dose CT scan of the lungs is recommended for people who have at least a 30-pack-year history of smoking and are a current smoker or have quit within the past 15 years. A pack year of smoking is smoking an average of 1 pack of cigarettes a day for 1 year (for example: 1 pack a day for 30 years or 2 packs a day for 15 years). Yearly screening should continue until the smoker has stopped smoking for at least 15 years. Yearly screening should be stopped for people who develop a health problem that would prevent them from having lung cancer treatment.  If you are pregnant, do not drink alcohol. If you are breastfeeding,  be very cautious about drinking alcohol. If you are not pregnant and choose to drink alcohol, do not have more than 1 drink per day. One drink is considered to be 12 ounces (355 mL) of beer, 5 ounces (148 mL) of wine, or 1.5 ounces (44 mL) of liquor.  Avoid use of street drugs. Do not share needles with anyone. Ask for help if you need support or instructions about stopping the use of drugs.  High blood pressure causes heart disease and increases the risk of  stroke. Your blood pressure should be checked at least every 1 to 2 years. Ongoing high blood pressure should be treated with medicines if weight loss and exercise do not work.  If you are 75-52 years old, ask your health care provider if you should take aspirin to prevent strokes.  Diabetes screening involves taking a blood sample to check your fasting blood sugar level. This should be done once every 3 years, after age 15, if you are within normal weight and without risk factors for diabetes. Testing should be considered at a younger age or be carried out more frequently if you are overweight and have at least 1 risk factor for diabetes.  Breast cancer screening is essential preventive care for women. You should practice "breast self-awareness." This means understanding the normal appearance and feel of your breasts and may include breast self-examination. Any changes detected, no matter how small, should be reported to a health care provider. Women in their 58s and 30s should have a clinical breast exam (CBE) by a health care provider as part of a regular health exam every 1 to 3 years. After age 16, women should have a CBE every year. Starting at age 53, women should consider having a mammogram (breast X-ray test) every year. Women who have a family history of breast cancer should talk to their health care provider about genetic screening. Women at a high risk of breast cancer should talk to their health care providers about having an MRI and a mammogram every year.  Breast cancer gene (BRCA)-related cancer risk assessment is recommended for women who have family members with BRCA-related cancers. BRCA-related cancers include breast, ovarian, tubal, and peritoneal cancers. Having family members with these cancers may be associated with an increased risk for harmful changes (mutations) in the breast cancer genes BRCA1 and BRCA2. Results of the assessment will determine the need for genetic counseling and  BRCA1 and BRCA2 testing.  Routine pelvic exams to screen for cancer are no longer recommended for nonpregnant women who are considered low risk for cancer of the pelvic organs (ovaries, uterus, and vagina) and who do not have symptoms. Ask your health care provider if a screening pelvic exam is right for you.  If you have had past treatment for cervical cancer or a condition that could lead to cancer, you need Pap tests and screening for cancer for at least 20 years after your treatment. If Pap tests have been discontinued, your risk factors (such as having a new sexual partner) need to be reassessed to determine if screening should be resumed. Some women have medical problems that increase the chance of getting cervical cancer. In these cases, your health care provider may recommend more frequent screening and Pap tests.  The HPV test is an additional test that may be used for cervical cancer screening. The HPV test looks for the virus that can cause the cell changes on the cervix. The cells collected during the Pap test can be  tested for HPV. The HPV test could be used to screen women aged 30 years and older, and should be used in women of any age who have unclear Pap test results. After the age of 30, women should have HPV testing at the same frequency as a Pap test.  Colorectal cancer can be detected and often prevented. Most routine colorectal cancer screening begins at the age of 50 years and continues through age 75 years. However, your health care provider may recommend screening at an earlier age if you have risk factors for colon cancer. On a yearly basis, your health care provider may provide home test kits to check for hidden blood in the stool. Use of a small camera at the end of a tube, to directly examine the colon (sigmoidoscopy or colonoscopy), can detect the earliest forms of colorectal cancer. Talk to your health care provider about this at age 50, when routine screening begins. Direct  exam of the colon should be repeated every 5-10 years through age 75 years, unless early forms of pre-cancerous polyps or small growths are found.  People who are at an increased risk for hepatitis B should be screened for this virus. You are considered at high risk for hepatitis B if:  You were born in a country where hepatitis B occurs often. Talk with your health care provider about which countries are considered high risk.  Your parents were born in a high-risk country and you have not received a shot to protect against hepatitis B (hepatitis B vaccine).  You have HIV or AIDS.  You use needles to inject street drugs.  You live with, or have sex with, someone who has hepatitis B.  You get hemodialysis treatment.  You take certain medicines for conditions like cancer, organ transplantation, and autoimmune conditions.  Hepatitis C blood testing is recommended for all people born from 1945 through 1965 and any individual with known risks for hepatitis C.  Practice safe sex. Use condoms and avoid high-risk sexual practices to reduce the spread of sexually transmitted infections (STIs). STIs include gonorrhea, chlamydia, syphilis, trichomonas, herpes, HPV, and human immunodeficiency virus (HIV). Herpes, HIV, and HPV are viral illnesses that have no cure. They can result in disability, cancer, and death.  You should be screened for sexually transmitted illnesses (STIs) including gonorrhea and chlamydia if:  You are sexually active and are younger than 24 years.  You are older than 24 years and your health care provider tells you that you are at risk for this type of infection.  Your sexual activity has changed since you were last screened and you are at an increased risk for chlamydia or gonorrhea. Ask your health care provider if you are at risk.  If you are at risk of being infected with HIV, it is recommended that you take a prescription medicine daily to prevent HIV infection. This is  called preexposure prophylaxis (PrEP). You are considered at risk if:  You are a heterosexual woman, are sexually active, and are at increased risk for HIV infection.  You take drugs by injection.  You are sexually active with a partner who has HIV.  Talk with your health care provider about whether you are at high risk of being infected with HIV. If you choose to begin PrEP, you should first be tested for HIV. You should then be tested every 3 months for as long as you are taking PrEP.  Osteoporosis is a disease in which the bones lose minerals and strength   with aging. This can result in serious bone fractures or breaks. The risk of osteoporosis can be identified using a bone density scan. Women ages 65 years and over and women at risk for fractures or osteoporosis should discuss screening with their health care providers. Ask your health care provider whether you should take a calcium supplement or vitamin D to reduce the rate of osteoporosis.  Menopause can be associated with physical symptoms and risks. Hormone replacement therapy is available to decrease symptoms and risks. You should talk to your health care provider about whether hormone replacement therapy is right for you.  Use sunscreen. Apply sunscreen liberally and repeatedly throughout the day. You should seek shade when your shadow is shorter than you. Protect yourself by wearing long sleeves, pants, a wide-brimmed hat, and sunglasses year round, whenever you are outdoors.  Once a month, do a whole body skin exam, using a mirror to look at the skin on your back. Tell your health care provider of new moles, moles that have irregular borders, moles that are larger than a pencil eraser, or moles that have changed in shape or color.  Stay current with required vaccines (immunizations).  Influenza vaccine. All adults should be immunized every year.  Tetanus, diphtheria, and acellular pertussis (Td, Tdap) vaccine. Pregnant women should  receive 1 dose of Tdap vaccine during each pregnancy. The dose should be obtained regardless of the length of time since the last dose. Immunization is preferred during the 27th-36th week of gestation. An adult who has not previously received Tdap or who does not know her vaccine status should receive 1 dose of Tdap. This initial dose should be followed by tetanus and diphtheria toxoids (Td) booster doses every 10 years. Adults with an unknown or incomplete history of completing a 3-dose immunization series with Td-containing vaccines should begin or complete a primary immunization series including a Tdap dose. Adults should receive a Td booster every 10 years.  Varicella vaccine. An adult without evidence of immunity to varicella should receive 2 doses or a second dose if she has previously received 1 dose. Pregnant females who do not have evidence of immunity should receive the first dose after pregnancy. This first dose should be obtained before leaving the health care facility. The second dose should be obtained 4-8 weeks after the first dose.  Human papillomavirus (HPV) vaccine. Females aged 13-26 years who have not received the vaccine previously should obtain the 3-dose series. The vaccine is not recommended for use in pregnant females. However, pregnancy testing is not needed before receiving a dose. If a female is found to be pregnant after receiving a dose, no treatment is needed. In that case, the remaining doses should be delayed until after the pregnancy. Immunization is recommended for any person with an immunocompromised condition through the age of 26 years if she did not get any or all doses earlier. During the 3-dose series, the second dose should be obtained 4-8 weeks after the first dose. The third dose should be obtained 24 weeks after the first dose and 16 weeks after the second dose.  Zoster vaccine. One dose is recommended for adults aged 60 years or older unless certain conditions are  present.  Measles, mumps, and rubella (MMR) vaccine. Adults born before 1957 generally are considered immune to measles and mumps. Adults born in 1957 or later should have 1 or more doses of MMR vaccine unless there is a contraindication to the vaccine or there is laboratory evidence of immunity to   each of the three diseases. A routine second dose of MMR vaccine should be obtained at least 28 days after the first dose for students attending postsecondary schools, health care workers, or international travelers. People who received inactivated measles vaccine or an unknown type of measles vaccine during 1963-1967 should receive 2 doses of MMR vaccine. People who received inactivated mumps vaccine or an unknown type of mumps vaccine before 1979 and are at high risk for mumps infection should consider immunization with 2 doses of MMR vaccine. For females of childbearing age, rubella immunity should be determined. If there is no evidence of immunity, females who are not pregnant should be vaccinated. If there is no evidence of immunity, females who are pregnant should delay immunization until after pregnancy. Unvaccinated health care workers born before 1957 who lack laboratory evidence of measles, mumps, or rubella immunity or laboratory confirmation of disease should consider measles and mumps immunization with 2 doses of MMR vaccine or rubella immunization with 1 dose of MMR vaccine.  Pneumococcal 13-valent conjugate (PCV13) vaccine. When indicated, a person who is uncertain of her immunization history and has no record of immunization should receive the PCV13 vaccine. An adult aged 19 years or older who has certain medical conditions and has not been previously immunized should receive 1 dose of PCV13 vaccine. This PCV13 should be followed with a dose of pneumococcal polysaccharide (PPSV23) vaccine. The PPSV23 vaccine dose should be obtained at least 8 weeks after the dose of PCV13 vaccine. An adult aged 19  years or older who has certain medical conditions and previously received 1 or more doses of PPSV23 vaccine should receive 1 dose of PCV13. The PCV13 vaccine dose should be obtained 1 or more years after the last PPSV23 vaccine dose.  Pneumococcal polysaccharide (PPSV23) vaccine. When PCV13 is also indicated, PCV13 should be obtained first. All adults aged 65 years and older should be immunized. An adult younger than age 65 years who has certain medical conditions should be immunized. Any person who resides in a nursing home or long-term care facility should be immunized. An adult smoker should be immunized. People with an immunocompromised condition and certain other conditions should receive both PCV13 and PPSV23 vaccines. People with human immunodeficiency virus (HIV) infection should be immunized as soon as possible after diagnosis. Immunization during chemotherapy or radiation therapy should be avoided. Routine use of PPSV23 vaccine is not recommended for American Indians, [place] Natives, or people younger than 65 years unless there are medical conditions that require PPSV23 vaccine. When indicated, people who have unknown immunization and have no record of immunization should receive PPSV23 vaccine. One-time revaccination 5 years after the first dose of PPSV23 is recommended for people aged 19-64 years who have chronic kidney failure, nephrotic syndrome, asplenia, or immunocompromised conditions. People who received 1-2 doses of PPSV23 before age 65 years should receive another dose of PPSV23 vaccine at age 65 years or later if at least 5 years have passed since the previous dose. Doses of PPSV23 are not needed for people immunized with PPSV23 at or after age 65 years.  Meningococcal vaccine. Adults with asplenia or persistent complement component deficiencies should receive 2 doses of quadrivalent meningococcal conjugate (MenACWY-D) vaccine. The doses should be obtained at least 2 months apart.  Microbiologists working with certain meningococcal bacteria, military recruits, people at risk during an outbreak, and people who travel to or live in countries with a high rate of meningitis should be immunized. A first-year college student up through age   21 years who is living in a residence hall should receive a dose if she did not receive a dose on or after her 16th birthday. Adults who have certain high-risk conditions should receive one or more doses of vaccine.  Hepatitis A vaccine. Adults who wish to be protected from this disease, have certain high-risk conditions, work with hepatitis A-infected animals, work in hepatitis A research labs, or travel to or work in countries with a high rate of hepatitis A should be immunized. Adults who were previously unvaccinated and who anticipate close contact with an international adoptee during the first 60 days after arrival in the [place] from a country with a high rate of hepatitis A should be immunized.  Hepatitis B vaccine. Adults who wish to be protected from this disease, have certain high-risk conditions, may be exposed to blood or other infectious body fluids, are household contacts or sex partners of hepatitis B positive people, are clients or workers in certain care facilities, or travel to or work in countries with a high rate of hepatitis B should be immunized.  Haemophilus influenzae type b (Hib) vaccine. A previously unvaccinated person with asplenia or sickle cell disease or having a scheduled splenectomy should receive 1 dose of Hib vaccine. Regardless of previous immunization, a recipient of a hematopoietic stem cell transplant should receive a 3-dose series 6-12 months after her successful transplant. Hib vaccine is not recommended for adults with HIV infection. Preventive Services / Frequency Ages 64 to 68 years  Blood pressure check.** / Every 1 to 2 years.  Lipid and cholesterol check.** / Every 5 years beginning at age  22.  Clinical breast exam.** / Every 3 years for women in their 88s and 53s.  BRCA-related cancer risk assessment.** / For women who have family members with a BRCA-related cancer (breast, ovarian, tubal, or peritoneal cancers).  Pap test.** / Every 2 years from ages 90 through 51. Every 3 years starting at age 21 through age 56 or 3 with a history of 3 consecutive normal Pap tests.  HPV screening.** / Every 3 years from ages 24 through ages 1 to 46 with a history of 3 consecutive normal Pap tests.  Hepatitis C blood test.** / For any individual with known risks for hepatitis C.  Skin self-exam. / Monthly.  Influenza vaccine. / Every year.  Tetanus, diphtheria, and acellular pertussis (Tdap, Td) vaccine.** / Consult your health care provider. Pregnant women should receive 1 dose of Tdap vaccine during each pregnancy. 1 dose of Td every 10 years.  Varicella vaccine.** / Consult your health care provider. Pregnant females who do not have evidence of immunity should receive the first dose after pregnancy.  HPV vaccine. / 3 doses over 6 months, if 72 and younger. The vaccine is not recommended for use in pregnant females. However, pregnancy testing is not needed before receiving a dose.  Measles, mumps, rubella (MMR) vaccine.** / You need at least 1 dose of MMR if you were born in 1957 or later. You may also need a 2nd dose. For females of childbearing age, rubella immunity should be determined. If there is no evidence of immunity, females who are not pregnant should be vaccinated. If there is no evidence of immunity, females who are pregnant should delay immunization until after pregnancy.  Pneumococcal 13-valent conjugate (PCV13) vaccine.** / Consult your health care provider.  Pneumococcal polysaccharide (PPSV23) vaccine.** / 1 to 2 doses if you smoke cigarettes or if you have certain conditions.  Meningococcal vaccine.** /  1 dose if you are age 19 to 21 years and a first-year college  student living in a residence hall, or have one of several medical conditions, you need to get vaccinated against meningococcal disease. You may also need additional booster doses.  Hepatitis A vaccine.** / Consult your health care provider.  Hepatitis B vaccine.** / Consult your health care provider.  Haemophilus influenzae type b (Hib) vaccine.** / Consult your health care provider. Ages 40 to 64 years  Blood pressure check.** / Every 1 to 2 years.  Lipid and cholesterol check.** / Every 5 years beginning at age 20 years.  Lung cancer screening. / Every year if you are aged 55-80 years and have a 30-pack-year history of smoking and currently smoke or have quit within the past 15 years. Yearly screening is stopped once you have quit smoking for at least 15 years or develop a health problem that would prevent you from having lung cancer treatment.  Clinical breast exam.** / Every year after age 40 years.  BRCA-related cancer risk assessment.** / For women who have family members with a BRCA-related cancer (breast, ovarian, tubal, or peritoneal cancers).  Mammogram.** / Every year beginning at age 40 years and continuing for as long as you are in good health. Consult with your health care provider.  Pap test.** / Every 3 years starting at age 30 years through age 65 or 70 years with a history of 3 consecutive normal Pap tests.  HPV screening.** / Every 3 years from ages 30 years through ages 65 to 70 years with a history of 3 consecutive normal Pap tests.  Fecal occult blood test (FOBT) of stool. / Every year beginning at age 50 years and continuing until age 75 years. You may not need to do this test if you get a colonoscopy every 10 years.  Flexible sigmoidoscopy or colonoscopy.** / Every 5 years for a flexible sigmoidoscopy or every 10 years for a colonoscopy beginning at age 50 years and continuing until age 75 years.  Hepatitis C blood test.** / For all people born from 1945 through  1965 and any individual with known risks for hepatitis C.  Skin self-exam. / Monthly.  Influenza vaccine. / Every year.  Tetanus, diphtheria, and acellular pertussis (Tdap/Td) vaccine.** / Consult your health care provider. Pregnant women should receive 1 dose of Tdap vaccine during each pregnancy. 1 dose of Td every 10 years.  Varicella vaccine.** / Consult your health care provider. Pregnant females who do not have evidence of immunity should receive the first dose after pregnancy.  Zoster vaccine.** / 1 dose for adults aged 60 years or older.  Measles, mumps, rubella (MMR) vaccine.** / You need at least 1 dose of MMR if you were born in 1957 or later. You may also need a 2nd dose. For females of childbearing age, rubella immunity should be determined. If there is no evidence of immunity, females who are not pregnant should be vaccinated. If there is no evidence of immunity, females who are pregnant should delay immunization until after pregnancy.  Pneumococcal 13-valent conjugate (PCV13) vaccine.** / Consult your health care provider.  Pneumococcal polysaccharide (PPSV23) vaccine.** / 1 to 2 doses if you smoke cigarettes or if you have certain conditions.  Meningococcal vaccine.** / Consult your health care provider.  Hepatitis A vaccine.** / Consult your health care provider.  Hepatitis B vaccine.** / Consult your health care provider.  Haemophilus influenzae type b (Hib) vaccine.** / Consult your health care provider. Ages 65   years and over  Blood pressure check.** / Every 1 to 2 years.  Lipid and cholesterol check.** / Every 5 years beginning at age 41 years.  Lung cancer screening. / Every year if you are aged 18-80 years and have a 30-pack-year history of smoking and currently smoke or have quit within the past 15 years. Yearly screening is stopped once you have quit smoking for at least 15 years or develop a health problem that would prevent you from having lung cancer  treatment.  Clinical breast exam.** / Every year after age 45 years.  BRCA-related cancer risk assessment.** / For women who have family members with a BRCA-related cancer (breast, ovarian, tubal, or peritoneal cancers).  Mammogram.** / Every year beginning at age 76 years and continuing for as long as you are in good health. Consult with your health care provider.  Pap test.** / Every 3 years starting at age 20 years through age 22 or 38 years with 3 consecutive normal Pap tests. Testing can be stopped between 65 and 70 years with 3 consecutive normal Pap tests and no abnormal Pap or HPV tests in the past 10 years.  HPV screening.** / Every 3 years from ages 61 years through ages 2 or 42 years with a history of 3 consecutive normal Pap tests. Testing can be stopped between 65 and 70 years with 3 consecutive normal Pap tests and no abnormal Pap or HPV tests in the past 10 years.  Fecal occult blood test (FOBT) of stool. / Every year beginning at age 6 years and continuing until age 28 years. You may not need to do this test if you get a colonoscopy every 10 years.  Flexible sigmoidoscopy or colonoscopy.** / Every 5 years for a flexible sigmoidoscopy or every 10 years for a colonoscopy beginning at age 39 years and continuing until age 68 years.  Hepatitis C blood test.** / For all people born from [birth year] through 1963 and any individual with known risks for hepatitis C.  Osteoporosis screening.** / A one-time screening for women ages 19 years and over and women at risk for fractures or osteoporosis.  Skin self-exam. / Monthly.  Influenza vaccine. / Every year.  Tetanus, diphtheria, and acellular pertussis (Tdap/Td) vaccine.** / 1 dose of Td every 10 years.  Varicella vaccine.** / Consult your health care provider.  Zoster vaccine.** / 1 dose for adults aged 37 years or older.  Pneumococcal 13-valent conjugate (PCV13) vaccine.** / Consult your health care provider.  Pneumococcal  polysaccharide (PPSV23) vaccine.** / 1 dose for all adults aged 60 years and older.  Meningococcal vaccine.** / Consult your health care provider.  Hepatitis A vaccine.** / Consult your health care provider.  Hepatitis B vaccine.** / Consult your health care provider.  Haemophilus influenzae type b (Hib) vaccine.** / Consult your health care provider. ** Family history and personal history of risk and conditions may change your health care provider's recommendations. Document Released: 03/06/2001 Document Revised: 05/25/2013 Document Reviewed: 06/05/2010 [facility] Patient Information 2015 [place], [place]. This information is not intended to replace advice given to you by your health care provider. Make sure you discuss any questions you have with your health care provider. Type 2 Diabetes Mellitus Type 2 diabetes mellitus, often simply referred to as type 2 diabetes, is a long-lasting (chronic) disease. In type 2 diabetes, the pancreas does not make enough insulin (a hormone), the cells are less responsive to the insulin that is made (insulin resistance), or both. Normally, insulin moves sugars from  food into the tissue cells. The tissue cells use the sugars for energy. The lack of insulin or the lack of normal response to insulin causes excess sugars to build up in the blood instead of going into the tissue cells. As a result, high blood sugar (hyperglycemia) develops. The effect of high sugar (glucose) levels can cause many complications. Type 2 diabetes was also previously called adult-onset diabetes, but it can occur at any age.  RISK FACTORS  A person is predisposed to developing type 2 diabetes if someone in the family has the disease and also has one or more of the following primary risk factors:  Overweight.  An inactive lifestyle.  A history of consistently eating high-calorie foods. Maintaining a normal weight and regular physical activity can reduce the chance of developing type 2  diabetes. SYMPTOMS  A person with type 2 diabetes may not show symptoms initially. The symptoms of type 2 diabetes appear slowly. The symptoms include:  Increased thirst (polydipsia).  Increased urination (polyuria).  Increased urination during the night (nocturia).  Weight loss. This weight loss may be rapid.  Frequent, recurring infections.  Tiredness (fatigue).  Weakness.  Vision changes, such as blurred vision.  Fruity smell to your breath.  Abdominal pain.  Nausea or vomiting.  Cuts or bruises which are slow to heal.  Tingling or numbness in the hands or feet. DIAGNOSIS Type 2 diabetes is frequently not diagnosed until complications of diabetes are present. Type 2 diabetes is diagnosed when symptoms or complications are present and when blood glucose levels are increased. Your blood glucose level may be checked by one or more of the following blood tests:  A fasting blood glucose test. You will not be allowed to eat for at least 8 hours before a blood sample is taken.  A random blood glucose test. Your blood glucose is checked at any time of the day regardless of when you ate.  A hemoglobin A1c blood glucose test. A hemoglobin A1c test provides information about blood glucose control over the previous 3 months.  An oral glucose tolerance test (OGTT). Your blood glucose is measured after you have not eaten (fasted) for 2 hours and then after you drink a glucose-containing beverage. TREATMENT   You may need to take insulin or diabetes medicine daily to keep blood glucose levels in the desired range.  If you use insulin, you may need to adjust the dosage depending on the carbohydrates that you eat with each meal or snack. The treatment goal is to maintain the before meal blood sugar (preprandial glucose) level at 70-130 mg/dL. HOME CARE INSTRUCTIONS   Have your hemoglobin A1c level checked twice a year.  Perform daily blood glucose monitoring as directed by your  health care provider.  Monitor urine ketones when you are ill and as directed by your health care provider.  Take your diabetes medicine or insulin as directed by your health care provider to maintain your blood glucose levels in the desired range.  Never run out of diabetes medicine or insulin. It is needed every day.  If you are using insulin, you may need to adjust the amount of insulin given based on your intake of carbohydrates. Carbohydrates can raise blood glucose levels but need to be included in your diet. Carbohydrates provide vitamins, minerals, and fiber which are an essential part of a healthy diet. Carbohydrates are found in fruits, vegetables, whole grains, dairy products, legumes, and foods containing added sugars.  Eat healthy foods. You should make   an appointment to see a registered dietitian to help you create an eating plan that is right for you.  Lose weight if you are overweight.  Carry a medical alert card or wear your medical alert jewelry.  Carry a 15-gram carbohydrate snack with you at all times to treat low blood glucose (hypoglycemia). Some examples of 15-gram carbohydrate snacks include:  Glucose tablets, 3 or 4.  Glucose gel, 15-gram tube.  Raisins, 2 tablespoons (24 grams).  Jelly beans, 6.  Animal crackers, 8.  Regular pop, 4 ounces (120 mL).  Gummy treats, 9.  Recognize hypoglycemia. Hypoglycemia occurs with blood glucose levels of 70 mg/dL and below. The risk for hypoglycemia increases when fasting or skipping meals, during or after intense exercise, and during sleep. Hypoglycemia symptoms can include:  Tremors or shakes.  Decreased ability to concentrate.  Sweating.  Increased heart rate.  Headache.  Dry mouth.  Hunger.  Irritability.  Anxiety.  Restless sleep.  Altered speech or coordination.  Confusion.  Treat hypoglycemia promptly. If you are alert and able to safely swallow, follow the 15:15 rule:  Take 15-20 grams of  rapid-acting glucose or carbohydrate. Rapid-acting options include glucose gel, glucose tablets, or 4 ounces (120 mL) of fruit juice, regular soda, or low-fat milk.  Check your blood glucose level 15 minutes after taking the glucose.  Take 15-20 grams more of glucose if the repeat blood glucose level is still 70 mg/dL or below.  Eat a meal or snack within 1 hour once blood glucose levels return to normal.  Be alert to feeling very thirsty and urinating more frequently than usual, which are early signs of hyperglycemia. An early awareness of hyperglycemia allows for prompt treatment. Treat hyperglycemia as directed by your health care provider.  Engage in at least 150 minutes of moderate-intensity physical activity a week, spread over at least 3 days of the week or as directed by your health care provider. In addition, you should engage in resistance exercise at least 2 times a week or as directed by your health care provider. Try to spend no more than 90 minutes at one time inactive.  Adjust your medicine and food intake as needed if you start a new exercise or sport.  Follow your sick-day plan anytime you are unable to eat or drink as usual.  Do not use any tobacco products including cigarettes, chewing tobacco, or electronic cigarettes. If you need help quitting, ask your health care provider.  Limit alcohol intake to no more than 1 drink per day for nonpregnant women and 2 drinks per day for men. You should drink alcohol only when you are also eating food. Talk with your health care provider whether alcohol is safe for you. Tell your health care provider if you drink alcohol several times a week.  Keep all follow-up visits as directed by your health care provider. This is important.  Schedule an eye exam soon after the diagnosis of type 2 diabetes and then annually.  Perform daily skin and foot care. Examine your skin and feet daily for cuts, bruises, redness, nail problems, bleeding,  blisters, or sores. A foot exam by a health care provider should be done annually.  Brush your teeth and gums at least twice a day and floss at least once a day. Follow up with your dentist regularly.  Share your diabetes management plan with your workplace or school.  Stay up-to-date with immunizations. It is recommended that people with diabetes who are over 53 years old get  the pneumonia vaccine. In some cases, two separate shots may be given. Ask your health care provider if your pneumonia vaccination is up-to-date.  Learn to manage stress.  Obtain ongoing diabetes education and support as needed.  Participate in or seek rehabilitation as needed to maintain or improve independence and quality of life. Request a physical or occupational therapy referral if you are having foot or hand numbness, or difficulties with grooming, dressing, eating, or physical activity. SEEK MEDICAL CARE IF:   You are unable to eat food or drink fluids for more than 6 hours.  You have nausea and vomiting for more than 6 hours.  Your blood glucose level is over 240 mg/dL.  There is a change in mental status.  You develop an additional serious illness.  You have diarrhea for more than 6 hours.  You have been sick or have had a fever for a couple of days and are not getting better.  You have pain during any physical activity.  SEEK IMMEDIATE MEDICAL CARE IF:  You have difficulty breathing.  You have moderate to large ketone levels. MAKE SURE YOU:  Understand these instructions.  Will watch your condition.  Will get help right away if you are not doing well or get worse. Document Released: 01/08/2005 Document Revised: 05/25/2013 Document Reviewed: 08/07/2011 [facility] Patient Information 2015 [place], [place]. This information is not intended to replace advice given to you by your health care provider. Make sure you discuss any questions you have with your health care provider.

## 2013-10-05 NOTE — Assessment & Plan Note (Signed)
She refused all vaccines today Exam done Labs ordered Pt ed material was given

## 2013-10-06 ENCOUNTER — Telehealth: Payer: Self-pay | Admitting: *Deleted

## 2013-10-06 LAB — CULTURE, URINE COMPREHENSIVE
Colony Count: NO GROWTH
Organism ID, Bacteria: NO GROWTH

## 2013-10-06 NOTE — Assessment & Plan Note (Signed)
Her UA dose not suggest that she has a UTI I await the urine culture results

## 2013-10-06 NOTE — Telephone Encounter (Signed)
Notified pt with md response.../lmb 

## 2013-10-06 NOTE — Assessment & Plan Note (Signed)
Her blood sugars are adequately well controlled 

## 2013-10-06 NOTE — Telephone Encounter (Signed)
The UA was normal The culture is not back yet

## 2013-10-06 NOTE — Telephone Encounter (Signed)
Left msg on triage requesting UA results.../lmb 

## 2013-10-07 ENCOUNTER — Encounter: Payer: Self-pay | Admitting: Internal Medicine

## 2013-10-15 ENCOUNTER — Encounter: Payer: BC Managed Care – PPO | Admitting: Internal Medicine

## 2013-11-06 ENCOUNTER — Other Ambulatory Visit: Payer: Self-pay

## 2013-12-21 ENCOUNTER — Telehealth: Payer: Self-pay | Admitting: Internal Medicine

## 2013-12-21 MED ORDER — SCOPOLAMINE 1 MG/3DAYS TD PT72: 1.0000 | MEDICATED_PATCH | TRANSDERMAL | Status: DC

## 2013-12-21 NOTE — Telephone Encounter (Signed)
Please advise 

## 2013-12-21 NOTE — Telephone Encounter (Signed)
done

## 2013-12-21 NOTE — Telephone Encounter (Signed)
Pt is going on a cruise and needs a patch called to her pharmacy at [facility], [place] [address].

## 2013-12-21 NOTE — Telephone Encounter (Signed)
Left message

## 2014-02-08 LAB — HM DIABETES EYE EXAM

## 2014-02-11 ENCOUNTER — Encounter: Payer: Self-pay | Admitting: Internal Medicine

## 2014-02-25 ENCOUNTER — Other Ambulatory Visit (HOSPITAL_COMMUNITY): Payer: Self-pay | Admitting: Family Medicine

## 2014-02-25 DIAGNOSIS — E0590 Thyrotoxicosis, unspecified without thyrotoxic crisis or storm: Secondary | ICD-10-CM

## 2014-03-01 ENCOUNTER — Ambulatory Visit (HOSPITAL_COMMUNITY): Payer: PRIVATE HEALTH INSURANCE

## 2014-05-17 ENCOUNTER — Telehealth: Payer: Self-pay | Admitting: Internal Medicine

## 2014-05-17 NOTE — Telephone Encounter (Signed)
Patient states she has appointment in May.  She states she will go to pharmacy to see what to take OTC.

## 2014-05-17 NOTE — Telephone Encounter (Signed)
Patient is requesting something to be sent to her pharmacy ([facility] at [facility] and [address]) for allergies.  Patient states she has not seen Dr. Ronnald Ramp for this.  Did advise patient that she would probably need an OV.  If Dr. Ronnald Ramp can not prescribe something she would like to know what he advises to take OTC.

## 2014-05-19 ENCOUNTER — Other Ambulatory Visit: Payer: Self-pay | Admitting: Gynecology

## 2014-05-20 LAB — CYTOLOGY - PAP

## 2014-06-16 ENCOUNTER — Ambulatory Visit (INDEPENDENT_AMBULATORY_CARE_PROVIDER_SITE_OTHER)
Admission: RE | Admit: 2014-06-16 | Discharge: 2014-06-16 | Disposition: A | Discharge status: Home / Self Care | Payer: BLUE CROSS/BLUE SHIELD | Source: Ambulatory Visit | Attending: Internal Medicine | Admitting: Internal Medicine

## 2014-06-16 ENCOUNTER — Other Ambulatory Visit: Payer: Self-pay | Admitting: Internal Medicine

## 2014-06-16 ENCOUNTER — Encounter: Payer: Self-pay | Admitting: Internal Medicine

## 2014-06-16 ENCOUNTER — Other Ambulatory Visit (INDEPENDENT_AMBULATORY_CARE_PROVIDER_SITE_OTHER): Payer: BLUE CROSS/BLUE SHIELD

## 2014-06-16 ENCOUNTER — Ambulatory Visit (INDEPENDENT_AMBULATORY_CARE_PROVIDER_SITE_OTHER): Payer: BLUE CROSS/BLUE SHIELD | Admitting: Internal Medicine

## 2014-06-16 VITALS — BP 108/70 | HR 84 | Temp 98.0°F | Resp 16 | Ht 59.0 in | Wt 171.0 lb

## 2014-06-16 DIAGNOSIS — Z0000 Encounter for general adult medical examination without abnormal findings: Secondary | ICD-10-CM

## 2014-06-16 DIAGNOSIS — M25559 Pain in unspecified hip: Secondary | ICD-10-CM | POA: Insufficient documentation

## 2014-06-16 DIAGNOSIS — M25552 Pain in left hip: Secondary | ICD-10-CM

## 2014-06-16 DIAGNOSIS — E118 Type 2 diabetes mellitus with unspecified complications: Secondary | ICD-10-CM

## 2014-06-16 DIAGNOSIS — J301 Allergic rhinitis due to pollen: Secondary | ICD-10-CM | POA: Diagnosis not present

## 2014-06-16 LAB — HEMOGLOBIN A1C: Hgb A1c MFr Bld: 6.6 % — ABNORMAL HIGH (ref 4.6–6.5)

## 2014-06-16 LAB — CBC WITH DIFFERENTIAL/PLATELET
Basophils Absolute: 0 10*3/uL (ref 0.0–0.1)
Basophils Relative: 0.6 % (ref 0.0–3.0)
Eosinophils Absolute: 0.4 10*3/uL (ref 0.0–0.7)
Eosinophils Relative: 5.3 % — ABNORMAL HIGH (ref 0.0–5.0)
HCT: 38.6 % (ref 36.0–46.0)
Hemoglobin: 12.6 g/dL (ref 12.0–15.0)
Lymphocytes Relative: 48.8 % — ABNORMAL HIGH (ref 12.0–46.0)
Lymphs Abs: 3.5 10*3/uL (ref 0.7–4.0)
MCHC: 32.7 g/dL (ref 30.0–36.0)
MCV: 84.7 fl (ref 78.0–100.0)
Monocytes Absolute: 0.6 10*3/uL (ref 0.1–1.0)
Monocytes Relative: 7.8 % (ref 3.0–12.0)
Neutro Abs: 2.7 10*3/uL (ref 1.4–7.7)
Neutrophils Relative %: 37.5 % — ABNORMAL LOW (ref 43.0–77.0)
Platelets: 237 10*3/uL (ref 150.0–400.0)
RBC: 4.55 Mil/uL (ref 3.87–5.11)
RDW: 14.6 % (ref 11.5–15.5)
WBC: 7.2 10*3/uL (ref 4.0–10.5)

## 2014-06-16 LAB — URINALYSIS, ROUTINE W REFLEX MICROSCOPIC
Bilirubin Urine: NEGATIVE
Ketones, ur: NEGATIVE
Leukocytes, UA: NEGATIVE
Nitrite: NEGATIVE
Specific Gravity, Urine: 1.01 (ref 1.000–1.030)
Total Protein, Urine: NEGATIVE
Urine Glucose: NEGATIVE
Urobilinogen, UA: 0.2 (ref 0.0–1.0)
pH: 5 (ref 5.0–8.0)

## 2014-06-16 LAB — COMPREHENSIVE METABOLIC PANEL
ALT: 25 U/L (ref 0–35)
AST: 24 U/L (ref 0–37)
Albumin: 4.1 g/dL (ref 3.5–5.2)
Alkaline Phosphatase: 73 U/L (ref 39–117)
BUN: 12 mg/dL (ref 6–23)
CO2: 29 mEq/L (ref 19–32)
Calcium: 9.9 mg/dL (ref 8.4–10.5)
Chloride: 101 mEq/L (ref 96–112)
Creatinine, Ser: 0.9 mg/dL (ref 0.40–1.20)
GFR: 83.14 mL/min (ref >60.00)
Glucose, Bld: 99 mg/dL (ref 70–99)
Potassium: 4 mEq/L (ref 3.5–5.1)
Sodium: 135 mEq/L (ref 135–145)
Total Bilirubin: 0.5 mg/dL (ref 0.2–1.2)
Total Protein: 8.6 g/dL — ABNORMAL HIGH (ref 6.0–8.3)

## 2014-06-16 LAB — TSH: TSH: 1.62 u[IU]/mL (ref 0.35–4.50)

## 2014-06-16 LAB — LIPID PANEL
Cholesterol: 107 mg/dL (ref 0–200)
HDL: 62.4 mg/dL (ref >39.00)
LDL Cholesterol: 36 mg/dL (ref 0–99)
NonHDL: 44.6
Total CHOL/HDL Ratio: 2
Triglycerides: 41 mg/dL (ref 0.0–149.0)
VLDL: 8.2 mg/dL (ref 0.0–40.0)

## 2014-06-16 MED ORDER — CETIRIZINE HCL 10 MG PO TABS: 10.0000 mg | ORAL_TABLET | Freq: Every day | ORAL | Status: DC

## 2014-06-16 MED ORDER — BECLOMETHASONE DIPROPIONATE 80 MCG/ACT NA AERS: 4.0000 | INHALATION_SPRAY | Freq: Every day | NASAL | Status: DC

## 2014-06-16 NOTE — Progress Notes (Signed)
Pre visit review using our clinic review tool, if applicable. No additional management support is needed unless otherwise documented below in the visit note. 

## 2014-06-16 NOTE — Progress Notes (Signed)
Subjective:  Patient ID: Angel Gordon, female    DOB: May 29, 1955  Age: 59 y.o. MRN: [id number]  CC: Annual Exam; Diabetes; Hip Pain; and Allergic Rhinitis    HPI Angel Gordon presents for follow up on DM 2 but she also complains of diffuse left hip pain for several weeks, there was no trauma or injury. The pain occurs with any weight bearing activity. She takes OTC doses of tylenol and advil and that provides pain relief. She also needs a f/up on DM 2 and she tells me that her blood sugars have been well controlled. In addition, she complains of a runny nose today.   Outpatient Prescriptions Prior to Visit  Medication Sig Dispense Refill  . metFORMIN (GLUCOPHAGE) 500 MG tablet TAKE 1 TABLET BY MOUTH TWICE DAILY 60 tablet 5  . acetaminophen (TYLENOL) 500 MG tablet Take 500 mg by mouth every 6 (six) hours as needed. For headache/general pain.    Marland Kitchen glucose blood test strip 1 each by Other route as needed. Use as instructed     . scopolamine (TRANSDERM-SCOP) 1 MG/3DAYS Place 1 patch (1.5 mg total) onto the skin every 3 (three) days. (Patient not taking: Reported on 06/16/2014) 10 patch 0   No facility-administered medications prior to visit.    ROS Review of Systems  Constitutional: Negative.   HENT: Positive for congestion, postnasal drip and rhinorrhea. Negative for dental problem, drooling, ear discharge, ear pain, facial swelling, hearing loss, mouth sores, nosebleeds, sinus pressure, sneezing, sore throat, tinnitus, trouble swallowing and voice change.   Eyes: Negative.   Respiratory: Negative.  Negative for cough, choking, chest tightness, shortness of breath and stridor.   Cardiovascular: Negative.   Gastrointestinal: Negative.  Negative for nausea, vomiting, abdominal pain, diarrhea, constipation and blood in stool.  Endocrine: Negative.  Negative for polydipsia, polyphagia and polyuria.  Genitourinary: Negative.  Negative for dysuria, urgency, hematuria, decreased urine  volume, difficulty urinating and genital sores.  Musculoskeletal: Positive for arthralgias (left hip for 2 weeks). Negative for myalgias, back pain, joint swelling, gait problem, neck pain and neck stiffness.  Skin: Negative.   Allergic/Immunologic: Negative.   Neurological: Negative.   Hematological: Negative.  Negative for adenopathy. Does not bruise/bleed easily.  Psychiatric/Behavioral: Negative.     Objective:  BP 108/70 mmHg  Pulse 84  Temp(Src) 98 F (36.7 C) (Oral)  Resp 16  Ht 4\' 11"  (1.499 m)  Wt 171 lb (77.565 kg)  BMI 34.52 kg/m2  SpO2 99%  BP Readings from Last 3 Encounters:  06/16/14 108/70  10/05/13 120/86  09/24/12 120/82    Wt Readings from Last 3 Encounters:  06/16/14 171 lb (77.565 kg)  10/05/13 180 lb 12 oz (81.988 kg)  09/24/12 179 lb (81.194 kg)    Physical Exam  Constitutional: She is oriented to person, place, and time. She appears well-developed and well-nourished. No distress.  HENT:  Head: Normocephalic and atraumatic.  Nose: Mucosal edema present. No rhinorrhea, sinus tenderness, nasal deformity, septal deviation or nasal septal hematoma. No epistaxis.  No foreign bodies. Right sinus exhibits no maxillary sinus tenderness and no frontal sinus tenderness. Left sinus exhibits no maxillary sinus tenderness and no frontal sinus tenderness.  Mouth/Throat: Oropharynx is clear and moist and mucous membranes are normal. Mucous membranes are not pale, not dry and not cyanotic. No oral lesions. No trismus in the jaw. No uvula swelling. No oropharyngeal exudate, posterior oropharyngeal edema, posterior oropharyngeal erythema or tonsillar abscesses.  Eyes: Conjunctivae are normal. Right eye exhibits  no discharge. Left eye exhibits no discharge. No scleral icterus.  Neck: Normal range of motion. Neck supple. No JVD present. No tracheal deviation present. No thyromegaly present.  Cardiovascular: Normal rate, regular rhythm, normal heart sounds and intact distal  pulses.  Exam reveals no gallop and no friction rub.   No murmur heard. Pulmonary/Chest: Effort normal and breath sounds normal. No stridor. No respiratory distress. She has no wheezes. She exhibits no tenderness.  Abdominal: Soft. Normal appearance and bowel sounds are normal. She exhibits no distension and no mass. There is no hepatosplenomegaly, splenomegaly or hepatomegaly. There is no tenderness. There is no rebound, no guarding and no CVA tenderness. No hernia. Hernia confirmed negative in the ventral area, confirmed negative in the right inguinal area and confirmed negative in the left inguinal area.  Musculoskeletal: Normal range of motion. She exhibits no edema or tenderness.       Left hip: Normal. She exhibits normal range of motion, normal strength, no tenderness, no bony tenderness, no swelling, no crepitus, no deformity and no laceration.  Lymphadenopathy:    She has no cervical adenopathy.       Right: No inguinal adenopathy present.       Left: No inguinal adenopathy present.  Neurological: She is oriented to person, place, and time.  Skin: Skin is warm and dry. No rash noted. She is not diaphoretic. No erythema. No pallor.  Vitals reviewed.   Lab Results  Component Value Date   WBC 7.2 06/16/2014   HGB 12.6 06/16/2014   HCT 38.6 06/16/2014   PLT 237.0 06/16/2014   GLUCOSE 99 06/16/2014   CHOL 107 06/16/2014   TRIG 41.0 06/16/2014   HDL 62.40 06/16/2014   LDLCALC 36 06/16/2014   ALT 25 06/16/2014   AST 24 06/16/2014   NA 135 06/16/2014   K 4.0 06/16/2014   CL 101 06/16/2014   CREATININE 0.90 06/16/2014   BUN 12 06/16/2014   CO2 29 06/16/2014   TSH 1.62 06/16/2014   HGBA1C 6.6* 06/16/2014   MICROALBUR 2.8* 10/05/2013    Ct Soft Tissue Neck Wo Contrast  04/06/2013   CLINICAL DATA:  Left hard palate mass.  EXAM: CT NECK WITHOUT CONTRAST  TECHNIQUE: Multidetector CT imaging of the neck was performed following the standard protocol without intravenous contrast.   COMPARISON:  None.  FINDINGS: The visualized portion of the brain and orbits is unremarkable. The right maxillary sinus mucous retention cyst is noted. There is minimal left maxillary sinus mucosal thickening. The mastoid air cells are clear.  There is slight asymmetric soft tissue prominence along the left aspect of the hard palate (for example series 103, image 31 and series 104, image 67). A discrete, measurable mass is not clearly identified, although evaluation is limited by lack of IV contrast. There may be slight osseous remodeling of the hard palate, however no aggressive osseous destruction is seen.  The nasopharynx, oropharynx and larynx are grossly unremarkable. The thyroid appears slightly heterogeneous without discrete nodules identified. The submandibular and parotid glands are unremarkable. No enlarged lymph nodes are identified. The visualized lung apices are clear. Mild-to-moderate cervical spondylosis is noted at C4-5, C5-6, and C6-7.  IMPRESSION: Slight asymmetry of the left hard palate as above without a definite, discrete mass or aggressive osseous erosion identified.   Electronically Signed   By: Logan Bores   On: 04/06/2013 17:42    Assessment & Plan:   Yarlin was seen today for annual exam, diabetes, hip pain and allergic rhinitis .  Diagnoses and all orders for this visit:  Type II diabetes mellitus with manifestations - her blood sugars are well controlled, will cont the current medical regimen Orders: -     Hemoglobin A1c; Future  Routine health maintenance Orders: -     Lipid panel; Future -     Comprehensive metabolic panel; Future -     CBC with Differential/Platelet; Future -     TSH; Future -     Urinalysis, Routine w reflex microscopic; Future  Hip pain, acute, left - exam is WNL, xrays are + for DJD, will cont the current meds for pain relief Orders: -     Cancel: DG HIP UNILAT WITH PELVIS MIN 4 VIEWS LEFT; Future  Allergic rhinitis due to  pollen Orders: -     Beclomethasone Dipropionate (QNASL) 80 MCG/ACT AERS; Place 4 Act into the nose daily. -     cetirizine (ZYRTEC) 10 MG tablet; Take 1 tablet (10 mg total) by mouth daily.   I have discontinued Ms. Ashmore's glucose blood, acetaminophen, and scopolamine. I am also having her start on Beclomethasone Dipropionate and cetirizine. Additionally, I am having her maintain her metFORMIN.  Meds ordered this encounter  Medications  . Beclomethasone Dipropionate (QNASL) 80 MCG/ACT AERS    Sig: Place 4 Act into the nose daily.    Dispense:  8.7 g    Refill:  11  . cetirizine (ZYRTEC) 10 MG tablet    Sig: Take 1 tablet (10 mg total) by mouth daily.    Dispense:  30 tablet    Refill:  11   See AVS for instructions about healthy living and anticipatory guidance.   Follow-up: Return in about 3 weeks (around 07/07/2014).  Scarlette Calico, MD

## 2014-06-16 NOTE — Patient Instructions (Signed)
Hip Pain Your hip is the joint between your upper legs and your lower pelvis. The bones, cartilage, tendons, and muscles of your hip joint perform a lot of work each day supporting your body weight and allowing you to move around. Hip pain can range from a minor ache to severe pain in one or both of your hips. Pain may be felt on the inside of the hip joint near the groin, or the outside near the buttocks and upper thigh. You may have swelling or stiffness as well.  HOME CARE INSTRUCTIONS   Take medicines only as directed by your health care provider.  Apply ice to the injured area:  Put ice in a plastic bag.  Place a towel between your skin and the bag.  Leave the ice on for 15-20 minutes at a time, 3-4 times a day.  Keep your leg raised (elevated) when possible to lessen swelling.  Avoid activities that cause pain.  Follow specific exercises as directed by your health care provider.  Sleep with a pillow between your legs on your most comfortable side.  Record how often you have hip pain, the location of the pain, and what it feels like. SEEK MEDICAL CARE IF:   You are unable to put weight on your leg.  Your hip is red or swollen or very tender to touch.  Your pain or swelling continues or worsens after 1 week.  You have increasing difficulty walking.  You have a fever. SEEK IMMEDIATE MEDICAL CARE IF:   You have fallen.  You have a sudden increase in pain and swelling in your hip. MAKE SURE YOU:   Understand these instructions.  Will watch your condition.  Will get help right away if you are not doing well or get worse. Document Released: 06/28/2009 Document Revised: 05/25/2013 Document Reviewed: 09/04/2012 ExitCare Patient Information 2015 [employer]. This information is not intended to replace advice given to you by your health care provider. Make sure you discuss any questions you have with your health care provider.  

## 2014-06-17 ENCOUNTER — Other Ambulatory Visit: Payer: Self-pay

## 2014-06-17 ENCOUNTER — Other Ambulatory Visit: Payer: Self-pay | Admitting: Internal Medicine

## 2014-06-17 DIAGNOSIS — J301 Allergic rhinitis due to pollen: Secondary | ICD-10-CM

## 2014-06-17 MED ORDER — MOMETASONE FUROATE 50 MCG/ACT NA SUSP: 2.0000 | Freq: Every day | NASAL | Status: DC

## 2014-06-18 MED ORDER — MOMETASONE FUROATE 50 MCG/ACT NA SUSP: 2.0000 | Freq: Every day | NASAL | Status: DC

## 2014-06-29 ENCOUNTER — Other Ambulatory Visit: Payer: Self-pay | Admitting: *Deleted

## 2014-09-09 ENCOUNTER — Ambulatory Visit (INDEPENDENT_AMBULATORY_CARE_PROVIDER_SITE_OTHER): Payer: BLUE CROSS/BLUE SHIELD

## 2014-09-09 ENCOUNTER — Encounter: Payer: Self-pay | Admitting: Podiatry

## 2014-09-09 ENCOUNTER — Ambulatory Visit (INDEPENDENT_AMBULATORY_CARE_PROVIDER_SITE_OTHER): Payer: BLUE CROSS/BLUE SHIELD | Admitting: Podiatry

## 2014-09-09 VITALS — BP 120/72 | HR 85 | Resp 16

## 2014-09-09 DIAGNOSIS — M79675 Pain in left toe(s): Secondary | ICD-10-CM

## 2014-09-09 DIAGNOSIS — M779 Enthesopathy, unspecified: Secondary | ICD-10-CM

## 2014-09-09 DIAGNOSIS — M205X2 Other deformities of toe(s) (acquired), left foot: Secondary | ICD-10-CM | POA: Diagnosis not present

## 2014-09-09 MED ORDER — DICLOFENAC SODIUM 75 MG PO TBEC: 75.0000 mg | DELAYED_RELEASE_TABLET | Freq: Two times a day (BID) | ORAL | Status: DC

## 2014-09-09 NOTE — Progress Notes (Signed)
   Subjective:    Patient ID: Angel Gordon, female    DOB: 1955-03-30, 59 y.o.   MRN: [id number]  HPI Patient presents with toe pain in their left foot, big toe - on bottom of toe. This has been going on for past 3 months. Pt is diabetic and stated "sugar levels are good".   Review of Systems  All other systems reviewed and are negative.      Objective:   Physical Exam        Assessment & Plan:

## 2014-09-12 NOTE — Progress Notes (Signed)
Subjective:     Patient ID: Angel Gordon, female   DOB: [birth year], 59 y.o.   MRN: [id number]  HPI patient presents stating I have had pain in my left big toe on the side and I was concerned that there might be something wrong with it. States she does not remember specific injury but is also concerned that she is a diabetic and that she should always get her foot checked   Review of Systems  All other systems reviewed and are negative.      Objective:   Physical Exam  Constitutional: She is oriented to person, place, and time.  Cardiovascular: Intact distal pulses.   Musculoskeletal: Normal range of motion.  Neurological: She is oriented to person, place, and time.  Skin: Skin is warm.  Nursing note and vitals reviewed.  neurovascular status intact muscle strength adequate with range of motion within normal limits. Patient's noted to have good digital flow is well oriented 3 and does have good hair growth to the digits. I noted in the left big toe lateral side there is inflammation of the joint surface but I did not note range of motion loss of a significant nature first MPJ and only mild crepitus upon palpation and movement     Assessment:     Possibility for functional with mild structural hallux limitus deformity left    Plan:     H&P and x-rays reviewed. Careful injection was administered to the lateral side of the joint surface and dorsal across the first MPJ left which was tolerated well and patient will be seen back for [place] to recheck again as needed and ultimately could require surgical intervention

## 2014-09-24 ENCOUNTER — Other Ambulatory Visit: Payer: Self-pay | Admitting: Internal Medicine

## 2015-02-25 ENCOUNTER — Telehealth: Payer: Self-pay | Admitting: *Deleted

## 2015-02-25 MED ORDER — SCOPOLAMINE 1 MG/3DAYS TD PT72: 1.0000 | MEDICATED_PATCH | TRANSDERMAL | Status: DC

## 2015-02-25 NOTE — Telephone Encounter (Signed)
Pt informed

## 2015-02-25 NOTE — Telephone Encounter (Signed)
Left msg on triage stating going on cruise would like md to rx some Tranzoderm patches for motion sick sickness...Angel Gordon

## 2015-02-25 NOTE — Telephone Encounter (Signed)
done

## 2015-03-14 ENCOUNTER — Telehealth: Payer: Self-pay | Admitting: Internal Medicine

## 2015-03-14 NOTE — Telephone Encounter (Signed)
Pt called in and said that she needs refill for metFORMIN (GLUCOPHAGE) 500 MG tablet [id number]  Sent to her mail oder .

## 2015-03-16 MED ORDER — METFORMIN HCL 500 MG PO TABS: 500.0000 mg | ORAL_TABLET | Freq: Two times a day (BID) | ORAL | Status: DC

## 2015-03-16 NOTE — Telephone Encounter (Signed)
CPE appt made.   Pt has new mail order [place]. This has been added.

## 2015-03-31 LAB — HM DIABETES EYE EXAM

## 2015-04-04 ENCOUNTER — Telehealth: Payer: Self-pay | Admitting: Internal Medicine

## 2015-04-04 MED ORDER — LANCETS MISC: Status: DC

## 2015-04-04 MED ORDER — GLUCOSE BLOOD VI STRP
ORAL_STRIP | Status: DC
Start: 2015-04-04 — End: 2017-02-11

## 2015-04-04 MED ORDER — GLUCOSE BLOOD VI STRP: ORAL_STRIP | Status: DC

## 2015-04-04 NOTE — Telephone Encounter (Signed)
erx sent to local and to mail order. Pt informed of same.

## 2015-04-04 NOTE — Telephone Encounter (Signed)
Patient is requesting strips and needles for her One Touch Ultra She is out of them and needs [facility] [address]

## 2015-04-18 ENCOUNTER — Ambulatory Visit (INDEPENDENT_AMBULATORY_CARE_PROVIDER_SITE_OTHER): Payer: BLUE CROSS/BLUE SHIELD

## 2015-04-18 ENCOUNTER — Ambulatory Visit (INDEPENDENT_AMBULATORY_CARE_PROVIDER_SITE_OTHER): Payer: BLUE CROSS/BLUE SHIELD | Admitting: Podiatry

## 2015-04-18 ENCOUNTER — Encounter: Payer: Self-pay | Admitting: Podiatry

## 2015-04-18 VITALS — BP 115/80 | HR 78 | Resp 16

## 2015-04-18 DIAGNOSIS — M79675 Pain in left toe(s): Secondary | ICD-10-CM | POA: Diagnosis not present

## 2015-04-18 DIAGNOSIS — M779 Enthesopathy, unspecified: Secondary | ICD-10-CM

## 2015-04-18 DIAGNOSIS — M205X2 Other deformities of toe(s) (acquired), left foot: Secondary | ICD-10-CM

## 2015-04-19 ENCOUNTER — Encounter: Payer: Self-pay | Admitting: Podiatry

## 2015-04-20 NOTE — Progress Notes (Signed)
Subjective:     Patient ID: Angel Gordon, female   DOB: 05/14/1955, 60 y.o.   MRN: [id number]  HPI patient presents with chronic pain in the left big toe joint stating that it tingles at times bothers her and is especially worse with any form of activity   Review of Systems  All other systems reviewed and are negative.      Objective:   Physical Exam  Constitutional: She is oriented to person, place, and time.  Cardiovascular: Intact distal pulses.   Musculoskeletal: Normal range of motion.  Neurological: She is oriented to person, place, and time.  Skin: Skin is warm.  Nursing note and vitals reviewed.  neurovascular status intact muscle strength adequate range of motion within normal limits with patient noted to have mild diminishment of motion first MPJ left with pain in the lateral side of the joint with mild swelling. Patient's found have good digital perfusion is well oriented 3 with mild equinus condition     Assessment:     Hallux limitus deformity with inflammation and pain    Plan:     H&P x-rays reviewed with patient. Today I went ahead and I scanned for custom orthotics to reduce stress against the first MPJ with a Morton extension for the left orthotic. Patient will be seen back when ready  X-rays indicate mild changes around the first MPJ left with narrowing of the joint surface lateral side and minimal spur formation

## 2015-05-10 ENCOUNTER — Ambulatory Visit: Payer: BLUE CROSS/BLUE SHIELD | Admitting: *Deleted

## 2015-05-10 DIAGNOSIS — M779 Enthesopathy, unspecified: Secondary | ICD-10-CM

## 2015-05-10 NOTE — Progress Notes (Signed)
Patient ID: Angel Gordon, female   DOB: 09/21/1955, 60 y.o.   MRN: [id number] Patient presents for orthotic pick up.  Verbal and written break in and wear instructions given.  Patient will follow up in 4 weeks if symptoms worsen or fail to improve.

## 2015-05-10 NOTE — Patient Instructions (Signed)

## 2015-05-14 ENCOUNTER — Encounter: Payer: Self-pay | Admitting: Family Medicine

## 2015-05-14 ENCOUNTER — Ambulatory Visit (INDEPENDENT_AMBULATORY_CARE_PROVIDER_SITE_OTHER): Payer: BLUE CROSS/BLUE SHIELD | Admitting: Family Medicine

## 2015-05-14 VITALS — BP 128/84 | HR 60 | Temp 97.9°F | Ht 59.0 in | Wt 174.0 lb

## 2015-05-14 DIAGNOSIS — B029 Zoster without complications: Secondary | ICD-10-CM

## 2015-05-14 DIAGNOSIS — L209 Atopic dermatitis, unspecified: Secondary | ICD-10-CM | POA: Diagnosis not present

## 2015-05-14 MED ORDER — IBUPROFEN 800 MG PO TABS: 800.0000 mg | ORAL_TABLET | Freq: Three times a day (TID) | ORAL | Status: DC | PRN

## 2015-05-14 MED ORDER — VALACYCLOVIR HCL 1 G PO TABS: 1000.0000 mg | ORAL_TABLET | Freq: Three times a day (TID) | ORAL | Status: DC

## 2015-05-14 MED ORDER — TRIAMCINOLONE ACETONIDE 0.1 % EX CREA: 1.0000 "application " | TOPICAL_CREAM | Freq: Two times a day (BID) | CUTANEOUS | Status: DC

## 2015-05-14 MED ORDER — TRAMADOL HCL 50 MG PO TABS
50.0000 mg | ORAL_TABLET | Freq: Three times a day (TID) | ORAL | Status: DC | PRN
Start: 2015-05-14 — End: 2015-06-28

## 2015-05-14 NOTE — Assessment & Plan Note (Signed)
Treat with antiviral. Pain control stressed. Reviewed avoidance of immunocomp people, unvaccinated children and preg women

## 2015-05-14 NOTE — Progress Notes (Signed)
   Subjective:    Patient ID: SHIREKA TAK, female    DOB: [birth year], 60 y.o.   MRN: [id number]  HPI  60 year old female presents with new onset rash, concern for shingles.  She reports 1 week of tender area over left mid back.  today blistering rash appeared.  She has not treated rash with anything.  Pain is 6-7/10 on pain scale.  Also flare of ssmall eczema rash on chest. Out of topical steroid cream. Needs refill.  Social History /Family History/Past Medical History reviewed and updated if needed.  Review of Systems  Constitutional: Negative for fever and fatigue.  HENT: Negative for ear pain.   Eyes: Negative for pain.  Respiratory: Negative for chest tightness and shortness of breath.   Cardiovascular: Negative for chest pain, palpitations and leg swelling.  Gastrointestinal: Negative for abdominal pain.  Genitourinary: Negative for dysuria.       Objective:   Physical Exam  Constitutional: Vital signs are normal. She appears well-developed and well-nourished. She is cooperative.  Non-toxic appearance. She does not appear ill. No distress.  HENT:  Head: Normocephalic.  Right Ear: Hearing, tympanic membrane, external ear and ear canal normal. Tympanic membrane is not erythematous, not retracted and not bulging.  Left Ear: Hearing, tympanic membrane, external ear and ear canal normal. Tympanic membrane is not erythematous, not retracted and not bulging.  Nose: No mucosal edema or rhinorrhea. Right sinus exhibits no maxillary sinus tenderness and no frontal sinus tenderness. Left sinus exhibits no maxillary sinus tenderness and no frontal sinus tenderness.  Mouth/Throat: Uvula is midline, oropharynx is clear and moist and mucous membranes are normal.  Eyes: Conjunctivae, EOM and lids are normal. Pupils are equal, round, and reactive to light. Lids are everted and swept, no foreign bodies found.  Neck: Trachea normal and normal range of motion. Neck supple. Carotid  bruit is not present. No thyroid mass and no thyromegaly present.  Cardiovascular: Normal rate, regular rhythm, S1 normal, S2 normal, normal heart sounds, intact distal pulses and normal pulses.  Exam reveals no gallop and no friction rub.   No murmur heard. Pulmonary/Chest: Effort normal and breath sounds normal. No tachypnea. No respiratory distress. She has no decreased breath sounds. She has no wheezes. She has no rhonchi. She has no rales.  Abdominal: Soft. Normal appearance and bowel sounds are normal. There is no tenderness.  Neurological: She is alert.  Skin: Skin is warm, dry and intact. Rash noted. Rash is vesicular.     Unilateral vesicular rash on left mid back  Also small dry pathc on anterior neck, itchy.  Psychiatric: Her speech is normal and behavior is normal. Judgment and thought content normal. Her mood appears not anxious. Cognition and memory are normal. She does not exhibit a depressed mood.          Assessment & Plan:

## 2015-05-14 NOTE — Patient Instructions (Signed)
Start antiviral x 7 days.  Make sure pai controlled. Start ibuprofen 800 mg every eight hours. You can use tramadol for break through pain.  Can apply steroid cream to rash on neck. Call if pain not controlled.  Shingles Shingles, which is also known as herpes zoster, is an infection that causes a painful skin rash and fluid-filled blisters. Shingles is not related to genital herpes, which is a sexually transmitted infection.   Shingles only develops in people who:  Have had chickenpox.  Have received the chickenpox vaccine. (This is rare.) CAUSES Shingles is caused by varicella-zoster virus (VZV). This is the same virus that causes chickenpox. After exposure to VZV, the virus stays in the body in an inactive (dormant) state. Shingles develops if the virus reactivates. This can happen many years after the initial exposure to VZV. It is not known what causes this virus to reactivate. RISK FACTORS People who have had chickenpox or received the chickenpox vaccine are at risk for shingles. Infection is more common in people who:  Are older than age 15.  Have a weakened defense (immune) system, such as those with HIV, AIDS, or cancer.  Are taking medicines that weaken the immune system, such as transplant medicines.  Are under great stress. SYMPTOMS Early symptoms of this condition include itching, tingling, and pain in an area on your skin. Pain may be described as burning, stabbing, or throbbing. A few days or weeks after symptoms start, a painful red rash appears, usually on one side of the body in a bandlike or beltlike pattern. The rash eventually turns into fluid-filled blisters that break open, scab over, and dry up in about 2-3 weeks. At any time during the infection, you may also develop:  A fever.  Chills.  A headache.  An upset stomach. DIAGNOSIS This condition is diagnosed with a skin exam. Sometimes, skin or fluid samples are taken from the blisters before a diagnosis is  made. These samples are examined under a microscope or sent to a lab for testing. TREATMENT There is no specific cure for this condition. Your health care provider will probably prescribe medicines to help you manage pain, recover more quickly, and avoid long-term problems. Medicines may include:  Antiviral drugs.  Anti-inflammatory drugs.  Pain medicines. If the area involved is on your face, you may be referred to a specialist, such as an eye doctor (ophthalmologist) or an ear, nose, and throat (ENT) doctor to help you avoid eye problems, chronic pain, or disability. HOME CARE INSTRUCTIONS Medicines  Take medicines only as directed by your health care provider.  Apply an anti-itch or numbing cream to the affected area as directed by your health care provider. Blister and Rash Care  Take a cool bath or apply cool compresses to the area of the rash or blisters as directed by your health care provider. This may help with pain and itching.  Keep your rash covered with a loose bandage (dressing). Wear loose-fitting clothing to help ease the pain of material rubbing against the rash.  Keep your rash and blisters clean with mild soap and cool water or as directed by your health care provider.  Check your rash every day for signs of infection. These include redness, swelling, and pain that lasts or increases.  Do not pick your blisters.  Do not scratch your rash. General Instructions  Rest as directed by your health care provider.  Keep all follow-up visits as directed by your health care provider. This is important.  Until your blisters scab over, your infection can cause chickenpox in people who have never had it or been vaccinated against it. To prevent this from happening, avoid contact with other people, especially:  Babies.  Pregnant women.  Children who have eczema.  Elderly people who have transplants.  People who have chronic illnesses, such as leukemia or AIDS. SEEK  MEDICAL CARE IF:  Your pain is not relieved with prescribed medicines.  Your pain does not get better after the rash heals.  Your rash looks infected. Signs of infection include redness, swelling, and pain that lasts or increases. SEEK IMMEDIATE MEDICAL CARE IF:  The rash is on your face or nose.  You have facial pain, pain around your eye area, or loss of feeling on one side of your face.  You have ear pain or you have ringing in your ear.  You have loss of taste.  Your condition gets worse.   This information is not intended to replace advice given to you by your health care provider. Make sure you discuss any questions you have with your health care provider.   Document Released: 01/08/2005 Document Revised: 01/29/2014 Document Reviewed: 11/19/2013 Elsevier Interactive Patient Education [employer].

## 2015-05-14 NOTE — Progress Notes (Signed)
Pre visit review using our clinic review tool, if applicable. No additional management support is needed unless otherwise documented below in the visit note. 

## 2015-05-14 NOTE — Assessment & Plan Note (Signed)
Topical steroid.

## 2015-05-16 ENCOUNTER — Other Ambulatory Visit: Payer: Self-pay | Admitting: Internal Medicine

## 2015-05-16 ENCOUNTER — Telehealth: Payer: Self-pay | Admitting: Internal Medicine

## 2015-05-16 MED ORDER — CLOBETASOL PROPIONATE 0.05 % EX OINT: 1.0000 "application " | TOPICAL_OINTMENT | Freq: Two times a day (BID) | CUTANEOUS | Status: DC

## 2015-05-16 NOTE — Telephone Encounter (Signed)
Pt informed

## 2015-05-16 NOTE — Telephone Encounter (Signed)
Patient is calling to follow up on this. Advised no response at this point

## 2015-05-16 NOTE — Telephone Encounter (Signed)
Patient seen Dr. Diona [facility].  Is requesting a cream for itching to be prescribed for her stomach.  [facility] [address]

## 2015-05-16 NOTE — Telephone Encounter (Signed)
done

## 2015-05-26 LAB — HM PAP SMEAR

## 2015-05-31 LAB — HM MAMMOGRAPHY: HM Mammogram: NORMAL (ref 0–4)

## 2015-06-28 ENCOUNTER — Other Ambulatory Visit (INDEPENDENT_AMBULATORY_CARE_PROVIDER_SITE_OTHER): Payer: BLUE CROSS/BLUE SHIELD

## 2015-06-28 ENCOUNTER — Ambulatory Visit (INDEPENDENT_AMBULATORY_CARE_PROVIDER_SITE_OTHER): Payer: BLUE CROSS/BLUE SHIELD | Admitting: Internal Medicine

## 2015-06-28 ENCOUNTER — Encounter: Payer: Self-pay | Admitting: Internal Medicine

## 2015-06-28 VITALS — BP 106/70 | HR 84 | Temp 98.0°F | Resp 16 | Ht 59.0 in | Wt 173.0 lb

## 2015-06-28 DIAGNOSIS — Z1159 Encounter for screening for other viral diseases: Secondary | ICD-10-CM | POA: Insufficient documentation

## 2015-06-28 DIAGNOSIS — E118 Type 2 diabetes mellitus with unspecified complications: Secondary | ICD-10-CM

## 2015-06-28 DIAGNOSIS — Z0000 Encounter for general adult medical examination without abnormal findings: Secondary | ICD-10-CM | POA: Diagnosis not present

## 2015-06-28 DIAGNOSIS — R3129 Other microscopic hematuria: Secondary | ICD-10-CM

## 2015-06-28 LAB — COMPREHENSIVE METABOLIC PANEL
ALT: 17 U/L (ref 0–35)
AST: 18 U/L (ref 0–37)
Albumin: 3.9 g/dL (ref 3.5–5.2)
Alkaline Phosphatase: 72 U/L (ref 39–117)
BUN: 17 mg/dL (ref 6–23)
CO2: 32 mEq/L (ref 19–32)
Calcium: 9.8 mg/dL (ref 8.4–10.5)
Chloride: 105 mEq/L (ref 96–112)
Creatinine, Ser: 1.11 mg/dL (ref 0.40–1.20)
GFR: 65.03 mL/min (ref >60.00)
Glucose, Bld: 127 mg/dL — ABNORMAL HIGH (ref 70–99)
Potassium: 4 mEq/L (ref 3.5–5.1)
Sodium: 141 mEq/L (ref 135–145)
Total Bilirubin: 0.3 mg/dL (ref 0.2–1.2)
Total Protein: 8.2 g/dL (ref 6.0–8.3)

## 2015-06-28 LAB — URINALYSIS, ROUTINE W REFLEX MICROSCOPIC
Bilirubin Urine: NEGATIVE
Ketones, ur: NEGATIVE
Leukocytes, UA: NEGATIVE
Nitrite: NEGATIVE
Specific Gravity, Urine: 1.025 (ref 1.000–1.030)
Total Protein, Urine: NEGATIVE
Urine Glucose: NEGATIVE
Urobilinogen, UA: 0.2 (ref 0.0–1.0)
pH: 5.5 (ref 5.0–8.0)

## 2015-06-28 LAB — MICROALBUMIN / CREATININE URINE RATIO
Creatinine,U: 182 mg/dL
Microalb Creat Ratio: 0.6 mg/g (ref 0.0–30.0)
Microalb, Ur: 1.1 mg/dL (ref 0.0–1.9)

## 2015-06-28 LAB — CBC WITH DIFFERENTIAL/PLATELET
Basophils Absolute: 0 10*3/uL (ref 0.0–0.1)
Basophils Relative: 0.7 % (ref 0.0–3.0)
Eosinophils Absolute: 0.4 10*3/uL (ref 0.0–0.7)
Eosinophils Relative: 5.6 % — ABNORMAL HIGH (ref 0.0–5.0)
HCT: 38.3 % (ref 36.0–46.0)
Hemoglobin: 12.3 g/dL (ref 12.0–15.0)
Lymphocytes Relative: 41.3 % (ref 12.0–46.0)
Lymphs Abs: 2.9 10*3/uL (ref 0.7–4.0)
MCHC: 32 g/dL (ref 30.0–36.0)
MCV: 87.6 fl (ref 78.0–100.0)
Monocytes Absolute: 0.5 10*3/uL (ref 0.1–1.0)
Monocytes Relative: 7.6 % (ref 3.0–12.0)
Neutro Abs: 3.1 10*3/uL (ref 1.4–7.7)
Neutrophils Relative %: 44.8 % (ref 43.0–77.0)
Platelets: 233 10*3/uL (ref 150.0–400.0)
RBC: 4.38 Mil/uL (ref 3.87–5.11)
RDW: 15.2 % (ref 11.5–15.5)
WBC: 7 10*3/uL (ref 4.0–10.5)

## 2015-06-28 LAB — TSH: TSH: 2.09 u[IU]/mL (ref 0.35–4.50)

## 2015-06-28 LAB — LIPID PANEL
Cholesterol: 117 mg/dL (ref 0–200)
HDL: 60.4 mg/dL (ref >39.00)
LDL Cholesterol: 46 mg/dL (ref 0–99)
NonHDL: 56.73
Total CHOL/HDL Ratio: 2
Triglycerides: 55 mg/dL (ref 0.0–149.0)
VLDL: 11 mg/dL (ref 0.0–40.0)

## 2015-06-28 LAB — HEMOGLOBIN A1C: Hgb A1c MFr Bld: 6.6 % — ABNORMAL HIGH (ref 4.6–6.5)

## 2015-06-28 NOTE — Progress Notes (Signed)
Pre visit review using our clinic review tool, if applicable. No additional management support is needed unless otherwise documented below in the visit note. 

## 2015-06-28 NOTE — Progress Notes (Signed)
Subjective:  Patient ID: Angel Gordon, female    DOB: Jun 22, 1955  Age: 60 y.o. MRN: [id number]  CC: Diabetes and Annual Exam   HPI Angel Gordon presents for a CPX. She feels well today and offers no complaints.  With respect to diabetes, her blood sugars have been well controlled and she denies polyuria, polydipsia, or polyphagia.  Outpatient Prescriptions Prior to Visit  Medication Sig Dispense Refill  . clobetasol ointment (TEMOVATE) [id number] % Apply 1 application topically 2 (two) times daily. 45 g 1  . glucose blood (COOL BLOOD GLUCOSE TEST STRIPS) test strip Use to test blood sugar twice per day. DXE11.9 200 each 3  . ibuprofen (ADVIL,MOTRIN) 800 MG tablet Take 1 tablet (800 mg total) by mouth every 8 (eight) hours as needed. 30 tablet 0  . Lancets MISC Use to test blood sugar. DX E11.9 200 each 3  . metFORMIN (GLUCOPHAGE) 500 MG tablet Take 1 tablet (500 mg total) by mouth 2 (two) times daily. 180 tablet 1  . mometasone (NASONEX) 50 MCG/ACT nasal spray Place 2 sprays into the nose daily. 17 g 11  . triamcinolone cream (KENALOG) 0.1 % Apply 1 application topically 2 (two) times daily. 30 g 0  . valACYclovir (VALTREX) 1000 MG tablet Take 1 tablet (1,000 mg total) by mouth 3 (three) times daily. 21 tablet 0  . scopolamine (TRANSDERM-SCOP, 1.5 MG,) 1 MG/3DAYS Place 1 patch (1.5 mg total) onto the skin every 3 (three) days. (Patient not taking: Reported on 06/28/2015) 4 patch 0  . traMADol (ULTRAM) 50 MG tablet Take 1 tablet (50 mg total) by mouth every 8 (eight) hours as needed for severe pain. (Patient not taking: Reported on 06/28/2015) 30 tablet 0   No facility-administered medications prior to visit.    ROS Review of Systems  Constitutional: Negative.   HENT: Negative.   Eyes: Negative.  Negative for visual disturbance.  Respiratory: Negative.  Negative for cough, choking, chest tightness, shortness of breath and stridor.   Cardiovascular: Negative.  Negative for chest  pain, palpitations and leg swelling.  Gastrointestinal: Negative.  Negative for nausea, vomiting, abdominal pain, diarrhea, constipation and blood in stool.  Endocrine: Negative.  Negative for polydipsia, polyphagia and polyuria.  Genitourinary: Negative.  Negative for dysuria, urgency, frequency, hematuria, flank pain, decreased urine volume, vaginal bleeding, vaginal discharge and difficulty urinating.  Musculoskeletal: Negative.  Negative for myalgias, back pain, joint swelling and arthralgias.  Skin: Negative.  Negative for color change, pallor and rash.  Allergic/Immunologic: Negative.   Neurological: Negative.  Negative for dizziness, tremors, weakness, light-headedness and numbness.  Hematological: Negative.  Negative for adenopathy. Does not bruise/bleed easily.  Psychiatric/Behavioral: Negative.     Objective:  BP 106/70 mmHg  Pulse 84  Temp(Src) 98 F (36.7 C) (Oral)  Resp 16  Ht 4\' 11"  (1.499 m)  Wt 173 lb (78.472 kg)  BMI 34.92 kg/m2  SpO2 99%  BP Readings from Last 3 Encounters:  06/28/15 106/70  05/14/15 128/84  04/18/15 115/80    Wt Readings from Last 3 Encounters:  06/28/15 173 lb (78.472 kg)  05/14/15 174 lb (78.926 kg)  06/16/14 171 lb (77.565 kg)    Physical Exam  Constitutional: She is oriented to person, place, and time. She appears well-developed and well-nourished. No distress.  HENT:  Head: Normocephalic and atraumatic.  Mouth/Throat: Oropharynx is clear and moist. No oropharyngeal exudate.  Eyes: Conjunctivae are normal. Right eye exhibits no discharge. Left eye exhibits no discharge. No scleral icterus.  Neck: Normal range of motion. Neck supple. No JVD present. No tracheal deviation present. No thyromegaly present.  Cardiovascular: Normal rate, regular rhythm, normal heart sounds and intact distal pulses.  Exam reveals no gallop and no friction rub.   No murmur heard. Pulmonary/Chest: Effort normal and breath sounds normal. No stridor. No  respiratory distress. She has no wheezes. She has no rales. She exhibits no tenderness.  Abdominal: Soft. Bowel sounds are normal. She exhibits no distension and no mass. There is no tenderness. There is no rebound and no guarding.  Musculoskeletal: Normal range of motion. She exhibits no edema.  Lymphadenopathy:    She has no cervical adenopathy.  Neurological: She is oriented to person, place, and time.  Skin: Skin is warm and dry. No rash noted. She is not diaphoretic. No erythema. No pallor.  Vitals reviewed.   Lab Results  Component Value Date   WBC 7.0 06/28/2015   HGB 12.3 06/28/2015   HCT 38.3 06/28/2015   PLT 233.0 06/28/2015   GLUCOSE 127* 06/28/2015   CHOL 117 06/28/2015   TRIG 55.0 06/28/2015   HDL 60.40 06/28/2015   LDLCALC 46 06/28/2015   ALT 17 06/28/2015   AST 18 06/28/2015   NA 141 06/28/2015   K 4.0 06/28/2015   CL 105 06/28/2015   CREATININE 1.11 06/28/2015   BUN 17 06/28/2015   CO2 32 06/28/2015   TSH 2.09 06/28/2015   HGBA1C 6.6* 06/28/2015   MICROALBUR 1.1 06/28/2015    Dg Hip Unilat With Pelvis 2-3 Views Left  06/16/2014  CLINICAL DATA:  Two week history of pain.  No history of trauma EXAM: LEFT HIP 2-3 VIEWS COMPARISON:  None. FINDINGS: Standing frontal and lateral images were obtained. There is mild narrowing of the left hip joint. There is bony overgrowth along the lateral aspect of the acetabulum. No fracture or dislocation. No erosive change. IMPRESSION: Osteoarthritic change, primarily laterally. No fracture or dislocation. Electronically Signed   By: Lowella Grip III M.D.   On: 06/16/2014 15:54    Assessment & Plan:   Angel Gordon was seen today for diabetes and annual exam.  Diagnoses and all orders for this visit:  Type 2 diabetes mellitus with complication, without long-term current use of insulin ([place])- her blood sugars are adequately well controlled with a single agent metformin. She continues to have normal renal function. -      Hemoglobin A1c; Future -     Microalbumin / creatinine urine ratio; Future  Routine health maintenance- exam completed, labs ordered and reviewed, she refused all vaccines today, her Pap smear, colonoscopy and mammogram are up-to-date, patient education material was given. -     Lipid panel; Future -     Comprehensive metabolic panel; Future -     CBC with Differential/Platelet; Future -     TSH; Future -     Urinalysis, Routine w reflex microscopic (not at [facility]); Future -     HIV antibody; Future  Need for hepatitis C screening test -     Hepatitis C antibody; Future  Hematuria, microscopic- she has asymptomatic hematuria, I will order a CT renal protocol to screen for renal cyst/mass/stone/bladder lesion. -     CT RENAL STONE STUDY; Future   I have discontinued Ms. Mcduffie's scopolamine, valACYclovir, and traMADol. I am also having her maintain her mometasone, metFORMIN, glucose blood, Lancets, triamcinolone cream, ibuprofen, and clobetasol ointment.  No orders of the defined types were placed in this encounter.     Follow-up:  Return in about 6 months (around 12/28/2015).  Scarlette Calico, MD

## 2015-06-28 NOTE — Patient Instructions (Signed)

## 2015-06-29 ENCOUNTER — Encounter: Payer: Self-pay | Admitting: Internal Medicine

## 2015-06-29 ENCOUNTER — Telehealth: Payer: Self-pay | Admitting: Internal Medicine

## 2015-06-29 DIAGNOSIS — R3121 Asymptomatic microscopic hematuria: Secondary | ICD-10-CM | POA: Insufficient documentation

## 2015-06-29 DIAGNOSIS — R3129 Other microscopic hematuria: Secondary | ICD-10-CM

## 2015-06-29 LAB — HEPATITIS C ANTIBODY: HCV Ab: NEGATIVE

## 2015-06-29 LAB — HIV ANTIBODY (ROUTINE TESTING W REFLEX): HIV 1&2 Ab, 4th Generation: NONREACTIVE

## 2015-06-29 NOTE — Telephone Encounter (Signed)
LMOVM, Stanton Kidney spoke with pt as well and let her kno when her scan was scheduled.

## 2015-06-29 NOTE — Telephone Encounter (Signed)
Can you please call this patient. She is really scared about having to get a CT scan and she has questions about what will happen afterwards. We are waiting on pre authorization from her ins company before we can schedule.  Pt is aware of that.  Her best number is [phone]

## 2015-06-30 ENCOUNTER — Ambulatory Visit (HOSPITAL_BASED_OUTPATIENT_CLINIC_OR_DEPARTMENT_OTHER): Admit: 2015-06-30 | Payer: BLUE CROSS/BLUE SHIELD | Admitting: Obstetrics and Gynecology

## 2015-06-30 ENCOUNTER — Encounter: Payer: Self-pay | Admitting: Internal Medicine

## 2015-06-30 ENCOUNTER — Encounter (HOSPITAL_BASED_OUTPATIENT_CLINIC_OR_DEPARTMENT_OTHER): Payer: Self-pay

## 2015-06-30 ENCOUNTER — Ambulatory Visit (INDEPENDENT_AMBULATORY_CARE_PROVIDER_SITE_OTHER)
Admission: RE | Admit: 2015-06-30 | Discharge: 2015-06-30 | Disposition: A | Discharge status: Home / Self Care | Payer: BLUE CROSS/BLUE SHIELD | Source: Ambulatory Visit | Attending: Internal Medicine | Admitting: Internal Medicine

## 2015-06-30 DIAGNOSIS — R3129 Other microscopic hematuria: Secondary | ICD-10-CM

## 2015-06-30 SURGERY — DILATATION AND CURETTAGE /HYSTEROSCOPY
Anesthesia: Choice

## 2015-07-02 NOTE — Addendum Note (Signed)
Addended by: Janith Lima on: 07/02/2015 10:59 AM   Modules accepted: Miquel Dunn

## 2015-07-04 ENCOUNTER — Telehealth: Payer: Self-pay | Admitting: Internal Medicine

## 2015-07-04 NOTE — Telephone Encounter (Signed)
This was faxed on Friday

## 2015-07-04 NOTE — Telephone Encounter (Signed)
Pt called in and wanted to know if the form for the metric was faxed over to her work.  She said that she gave it to Dr Ronnald Ramp the last time she was in the office   Best number  [phone]

## 2015-07-06 NOTE — Telephone Encounter (Signed)
Patient called and would like to speak to the nurse about the labs she had done the beginning of the mouth. States she had sent a email about it a few times. Please follow up.

## 2015-07-12 NOTE — Telephone Encounter (Signed)
Received form and called pt

## 2015-07-12 NOTE — Telephone Encounter (Signed)
Patient is requesting refax

## 2015-07-12 NOTE — Telephone Encounter (Signed)
Patient states that on the form her birthday was written instead of date of service.

## 2015-07-12 NOTE — Telephone Encounter (Signed)
Patient called back in regard to this.  States company did not received form.  Patient is requesting this form to be faxed to her at [phone].  Please follow up with patient at [phone].

## 2015-07-13 NOTE — Telephone Encounter (Signed)
faxed

## 2015-09-01 ENCOUNTER — Other Ambulatory Visit: Payer: Self-pay | Admitting: Internal Medicine

## 2015-09-28 ENCOUNTER — Other Ambulatory Visit (INDEPENDENT_AMBULATORY_CARE_PROVIDER_SITE_OTHER): Payer: BLUE CROSS/BLUE SHIELD

## 2015-09-28 ENCOUNTER — Ambulatory Visit (INDEPENDENT_AMBULATORY_CARE_PROVIDER_SITE_OTHER): Payer: BLUE CROSS/BLUE SHIELD | Admitting: Internal Medicine

## 2015-09-28 ENCOUNTER — Ambulatory Visit (INDEPENDENT_AMBULATORY_CARE_PROVIDER_SITE_OTHER)
Admission: RE | Admit: 2015-09-28 | Discharge: 2015-09-28 | Disposition: A | Discharge status: Home / Self Care | Payer: BLUE CROSS/BLUE SHIELD | Source: Ambulatory Visit | Attending: Internal Medicine | Admitting: Internal Medicine

## 2015-09-28 ENCOUNTER — Encounter: Payer: Self-pay | Admitting: Internal Medicine

## 2015-09-28 VITALS — BP 128/88 | HR 84 | Temp 97.8°F | Resp 16 | Ht 59.0 in | Wt 177.0 lb

## 2015-09-28 DIAGNOSIS — R0789 Other chest pain: Secondary | ICD-10-CM

## 2015-09-28 DIAGNOSIS — R791 Abnormal coagulation profile: Secondary | ICD-10-CM

## 2015-09-28 DIAGNOSIS — M545 Low back pain, unspecified: Secondary | ICD-10-CM | POA: Insufficient documentation

## 2015-09-28 DIAGNOSIS — E118 Type 2 diabetes mellitus with unspecified complications: Secondary | ICD-10-CM

## 2015-09-28 DIAGNOSIS — R3129 Other microscopic hematuria: Secondary | ICD-10-CM

## 2015-09-28 DIAGNOSIS — R7989 Other specified abnormal findings of blood chemistry: Secondary | ICD-10-CM

## 2015-09-28 DIAGNOSIS — M546 Pain in thoracic spine: Secondary | ICD-10-CM | POA: Insufficient documentation

## 2015-09-28 LAB — POCT URINALYSIS DIPSTICK
Bilirubin, UA: NEGATIVE
Blood, UA: 10
Glucose, UA: NEGATIVE
Nitrite, UA: NEGATIVE
Protein, UA: NEGATIVE
Spec Grav, UA: >=1.03
Urobilinogen, UA: NEGATIVE
pH, UA: 5.5

## 2015-09-28 LAB — COMPREHENSIVE METABOLIC PANEL
ALT: 24 U/L (ref 0–35)
AST: 23 U/L (ref 0–37)
Albumin: 4.3 g/dL (ref 3.5–5.2)
Alkaline Phosphatase: 83 U/L (ref 39–117)
BUN: 16 mg/dL (ref 6–23)
CO2: 31 mEq/L (ref 19–32)
Calcium: 9.9 mg/dL (ref 8.4–10.5)
Chloride: 103 mEq/L (ref 96–112)
Creatinine, Ser: 0.93 mg/dL (ref 0.40–1.20)
GFR: 79.69 mL/min (ref >60.00)
Glucose, Bld: 109 mg/dL — ABNORMAL HIGH (ref 70–99)
Potassium: 4.1 mEq/L (ref 3.5–5.1)
Sodium: 139 mEq/L (ref 135–145)
Total Bilirubin: 0.3 mg/dL (ref 0.2–1.2)
Total Protein: 8.9 g/dL — ABNORMAL HIGH (ref 6.0–8.3)

## 2015-09-28 LAB — TROPONIN I: TNIDX: 0.03 ug/l (ref 0.00–0.06)

## 2015-09-28 LAB — HEMOGLOBIN A1C: Hgb A1c MFr Bld: 6.8 % — ABNORMAL HIGH (ref 4.6–6.5)

## 2015-09-28 NOTE — Patient Instructions (Signed)

## 2015-09-28 NOTE — Progress Notes (Signed)
Subjective:  Patient ID: Angel Gordon, female    DOB: 1955-09-09  Age: 60 y.o. MRN: [id number]  CC: Back Pain and Chest Pain   HPI MASIYA BABICZ presents for chest and back pain for about 3-4 weeks. She denies any recent trauma or injury. She complains of diffuse tightness over both upper/anterior aspects of her chest. She also has a burning pain in her thoracic spine. She has not taken anything for the pain. She tells me that none of the pain radiates towards her extremities. She's had a few night sweats but denies cough/hemoptysis/shortness of breath/palpitations/fever/chills/paresthesias/or fatigue.  Outpatient Medications Prior to Visit  Medication Sig Dispense Refill  . clobetasol ointment (TEMOVATE) [id number] % Apply 1 application topically 2 (two) times daily. 45 g 1  . glucose blood (COOL BLOOD GLUCOSE TEST STRIPS) test strip Use to test blood sugar twice per day. DXE11.9 200 each 3  . ibuprofen (ADVIL,MOTRIN) 800 MG tablet Take 1 tablet (800 mg total) by mouth every 8 (eight) hours as needed. 30 tablet 0  . Lancets MISC Use to test blood sugar. DX E11.9 200 each 3  . metFORMIN (GLUCOPHAGE) 500 MG tablet TAKE 1 TABLET TWICE A DAY 180 tablet 1  . mometasone (NASONEX) 50 MCG/ACT nasal spray Place 2 sprays into the nose daily. 17 g 11  . triamcinolone cream (KENALOG) 0.1 % Apply 1 application topically 2 (two) times daily. 30 g 0   No facility-administered medications prior to visit.     ROS Review of Systems  Constitutional: Negative for activity change, appetite change, chills, diaphoresis, fatigue and unexpected weight change.  HENT: Negative.  Negative for sore throat, trouble swallowing and voice change.   Eyes: Negative for photophobia and visual disturbance.  Respiratory: Positive for chest tightness. Negative for apnea, cough, choking, shortness of breath, wheezing and stridor.   Cardiovascular: Positive for chest pain. Negative for leg swelling.  Gastrointestinal:  Negative.  Negative for abdominal pain, blood in stool, constipation, diarrhea, nausea and vomiting.  Endocrine: Negative.  Negative for polydipsia, polyphagia and polyuria.  Genitourinary: Negative.  Negative for difficulty urinating.  Musculoskeletal: Positive for back pain. Negative for arthralgias, joint swelling, myalgias, neck pain and neck stiffness.  Skin: Negative.  Negative for color change and rash.  Allergic/Immunologic: Negative.   Neurological: Negative.  Negative for dizziness, syncope, speech difficulty, weakness, light-headedness, numbness and headaches.  Hematological: Negative.  Negative for adenopathy. Does not bruise/bleed easily.  Psychiatric/Behavioral: Negative.     Objective:  BP 128/88 (BP Location: Left Arm, Patient Position: Sitting, Cuff Size: Normal)   Pulse 84   Temp 97.8 F (36.6 C) (Oral)   Resp 16   Ht 4\' 11"  (1.499 m)   Wt 177 lb (80.3 kg)   SpO2 97%   BMI 35.75 kg/m   BP Readings from Last 3 Encounters:  09/28/15 128/88  06/28/15 106/70  05/14/15 128/84    Wt Readings from Last 3 Encounters:  09/28/15 177 lb (80.3 kg)  06/28/15 173 lb (78.5 kg)  05/14/15 174 lb (78.9 kg)    Physical Exam  Constitutional: She is oriented to person, place, and time.  Non-toxic appearance. She does not have a sickly appearance. She does not appear ill. No distress.  HENT:  Mouth/Throat: Oropharynx is clear and moist. No oropharyngeal exudate.  Eyes: Conjunctivae are normal. Right eye exhibits no discharge. Left eye exhibits no discharge. No scleral icterus.  Neck: Normal range of motion. Neck supple. No JVD present. No tracheal deviation present.  No thyromegaly present.  Cardiovascular: Normal rate, regular rhythm, normal heart sounds and intact distal pulses.  Exam reveals no gallop and no friction rub.   No murmur heard. Pulses:      Carotid pulses are 1+ on the right side, and 1+ on the left side.      Radial pulses are 1+ on the right side, and 1+ on  the left side.       Femoral pulses are 1+ on the right side, and 1+ on the left side.      Popliteal pulses are 1+ on the right side, and 1+ on the left side.       Dorsalis pedis pulses are 1+ on the right side, and 1+ on the left side.       Posterior tibial pulses are 1+ on the right side, and 1+ on the left side.  An EKG could not be done as the system that supports our EKG machine is down. This was the case on 09/28/2015 and 09/29/2015.  Pulmonary/Chest: Effort normal and breath sounds normal. No stridor. No respiratory distress. She has no decreased breath sounds. She has no wheezes. She has no rhonchi. She has no rales. She exhibits no mass, no tenderness, no bony tenderness, no edema, no deformity and no retraction.  Abdominal: Soft. Bowel sounds are normal. She exhibits no distension and no mass. There is no tenderness. There is no rebound and no guarding.  Musculoskeletal: Normal range of motion. She exhibits no edema, tenderness or deformity.       Cervical back: Normal.       Thoracic back: Normal. She exhibits normal range of motion, no tenderness, no bony tenderness, no swelling, no edema, no deformity, no laceration, no pain, no spasm and normal pulse.       Lumbar back: Normal.  Lymphadenopathy:    She has no cervical adenopathy.  Neurological: She is oriented to person, place, and time.  Skin: Skin is warm and dry. No rash noted. She is not diaphoretic. No erythema. No pallor.  Vitals reviewed.   Lab Results  Component Value Date   WBC 7.0 06/28/2015   HGB 12.3 06/28/2015   HCT 38.3 06/28/2015   PLT 233.0 06/28/2015   GLUCOSE 109 (H) 09/28/2015   CHOL 117 06/28/2015   TRIG 55.0 06/28/2015   HDL 60.40 06/28/2015   LDLCALC 46 06/28/2015   ALT 24 09/28/2015   AST 23 09/28/2015   NA 139 09/28/2015   K 4.1 09/28/2015   CL 103 09/28/2015   CREATININE 0.93 09/28/2015   BUN 16 09/28/2015   CO2 31 09/28/2015   TSH 2.09 06/28/2015   HGBA1C 6.8 (H) 09/28/2015   MICROALBUR  1.1 06/28/2015    Ct Renal Stone Study  Result Date: 06/30/2015 CLINICAL DATA:  Intermittent left flank pain for 1 month. EXAM: CT ABDOMEN AND PELVIS WITHOUT CONTRAST TECHNIQUE: Multidetector CT imaging of the abdomen and pelvis was performed following the standard protocol without IV contrast. COMPARISON:  February 09, 2008 FINDINGS: Lower chest:  No acute findings. Hepatobiliary: There is mild diffuse low density of liver. The gallbladder is normal. No mass visualized on this un-enhanced exam. Pancreas: No mass or inflammatory process identified on this un-enhanced exam. Spleen: Within normal limits in size. Adrenals/Urinary Tract: The adrenal glands are normal. There is a 1 cm nonobstructing stone in the lower pole left kidney. There is no hydronephrosis bilaterally. There is no left kidney stone. The bladder is normal. Stomach/Bowel: No evidence of  obstruction, inflammatory process, or abnormal fluid collections. There is diverticulosis of colon without diverticulitis. The appendix is normal. Vascular/Lymphatic: No pathologically enlarged lymph nodes. No evidence of abdominal aortic aneurysm. Reproductive: No mass or other significant abnormality. Other: None. Musculoskeletal: Degenerative joint changes of the spine are identified. IMPRESSION: 1 cm nonobstructing stone in the lower pole right kidney. There is no hydronephrosis bilaterally. There is no left kidney stone or hydronephrosis. Electronically Signed   By: Abelardo Diesel M.D.   On: 06/30/2015 14:33    Assessment & Plan:   Caisyn was seen today for back pain and chest pain.  Diagnoses and all orders for this visit:  Low back pain without sciatica, unspecified back pain laterality -     POCT urinalysis dipstick -     Urine culture; Future -     etodolac (LODINE XL) 500 MG 24 hr tablet; Take 1 tablet (500 mg total) by mouth daily.  Chest tightness- an EKG could not be performed, her cardiac enzymes are normal, her d-dimer is slightly  elevated so I will screen her for PE with a CT angiogram with contrast, her chest x-ray is normal. -     EKG 12-Lead -     Troponin I; Future -     D-dimer, quantitative (not at [facility]); Future -     Cardiac panel; Future -     DG Chest 2 View; Future -     CT ANGIO CHEST PE W OR WO CONTRAST; Future  Type 2 diabetes mellitus with complication, without long-term current use of insulin ([place])- her A1c is 6.8%, her blood sugars are adequately well controlled. -     CBC with Differential/Platelet; Future -     Comprehensive metabolic panel; Future -     Hemoglobin A1c; Future  Hematuria, microscopic- dipstick UA is positive for leukocytes and 10 red blood cells, urine culture is pending to screen for UTI  Midline thoracic back pain- plain films showed DDD with spurring, will try an anti-inflammatory for symptom relief. -     DG Chest 2 View; Future -     DG Thoracic Spine W/Swimmers; Future -     CT ANGIO CHEST PE W OR WO CONTRAST; Future -     etodolac (LODINE XL) 500 MG 24 hr tablet; Take 1 tablet (500 mg total) by mouth daily.  D-dimer, elevated -     CT ANGIO CHEST PE W OR WO CONTRAST; Future   I am having Ms. Middlesworth start on etodolac. I am also having her maintain her mometasone, glucose blood, Lancets, triamcinolone cream, ibuprofen, clobetasol ointment, and metFORMIN.  Meds ordered this encounter  Medications  . etodolac (LODINE XL) 500 MG 24 hr tablet    Sig: Take 1 tablet (500 mg total) by mouth daily.    Dispense:  90 tablet    Refill:  1     Follow-up: Return in about 1 week (around 10/05/2015).  Scarlette Calico, MD

## 2015-09-29 ENCOUNTER — Other Ambulatory Visit (INDEPENDENT_AMBULATORY_CARE_PROVIDER_SITE_OTHER): Payer: BLUE CROSS/BLUE SHIELD

## 2015-09-29 ENCOUNTER — Telehealth: Payer: Self-pay

## 2015-09-29 ENCOUNTER — Ambulatory Visit: Payer: BLUE CROSS/BLUE SHIELD | Admitting: Internal Medicine

## 2015-09-29 ENCOUNTER — Encounter: Payer: Self-pay | Admitting: Internal Medicine

## 2015-09-29 ENCOUNTER — Ambulatory Visit (INDEPENDENT_AMBULATORY_CARE_PROVIDER_SITE_OTHER)
Admission: RE | Admit: 2015-09-29 | Discharge: 2015-09-29 | Disposition: A | Discharge status: Home / Self Care | Payer: BLUE CROSS/BLUE SHIELD | Source: Ambulatory Visit | Attending: Internal Medicine | Admitting: Internal Medicine

## 2015-09-29 DIAGNOSIS — R0789 Other chest pain: Secondary | ICD-10-CM | POA: Diagnosis not present

## 2015-09-29 DIAGNOSIS — M546 Pain in thoracic spine: Secondary | ICD-10-CM

## 2015-09-29 DIAGNOSIS — R791 Abnormal coagulation profile: Secondary | ICD-10-CM | POA: Diagnosis not present

## 2015-09-29 DIAGNOSIS — R7989 Other specified abnormal findings of blood chemistry: Secondary | ICD-10-CM | POA: Insufficient documentation

## 2015-09-29 DIAGNOSIS — E118 Type 2 diabetes mellitus with unspecified complications: Secondary | ICD-10-CM | POA: Diagnosis not present

## 2015-09-29 LAB — CBC WITH DIFFERENTIAL/PLATELET
Basophils Absolute: 0 10*3/uL (ref 0.0–0.1)
Basophils Relative: 0.2 % (ref 0.0–3.0)
Eosinophils Absolute: 0.3 10*3/uL (ref 0.0–0.7)
Eosinophils Relative: 5.2 % — ABNORMAL HIGH (ref 0.0–5.0)
HCT: 38.6 % (ref 36.0–46.0)
Hemoglobin: 12.7 g/dL (ref 12.0–15.0)
Lymphocytes Relative: 49.5 % — ABNORMAL HIGH (ref 12.0–46.0)
Lymphs Abs: 3.1 10*3/uL (ref 0.7–4.0)
MCHC: 32.9 g/dL (ref 30.0–36.0)
MCV: 86 fl (ref 78.0–100.0)
Monocytes Absolute: 0.5 10*3/uL (ref 0.1–1.0)
Monocytes Relative: 7.8 % (ref 3.0–12.0)
Neutro Abs: 2.4 10*3/uL (ref 1.4–7.7)
Neutrophils Relative %: 37.3 % — ABNORMAL LOW (ref 43.0–77.0)
Platelets: 230 10*3/uL (ref 150.0–400.0)
RBC: 4.49 Mil/uL (ref 3.87–5.11)
RDW: 14 % (ref 11.5–15.5)
WBC: 6.3 10*3/uL (ref 4.0–10.5)

## 2015-09-29 LAB — CARDIAC PANEL
CK-MB: 2.7 ng/mL (ref 0.3–4.0)
Relative Index: 2.5 calc (ref 0.0–2.5)
Total CK: 107 U/L (ref 7–177)

## 2015-09-29 LAB — D-DIMER, QUANTITATIVE: D-Dimer, Quant: 0.79 mcg/mL FEU — ABNORMAL HIGH (ref <0.50)

## 2015-09-29 MED ORDER — ETODOLAC ER 500 MG PO TB24: 500.0000 mg | ORAL_TABLET | Freq: Every day | ORAL | 1 refills | Status: DC

## 2015-09-29 MED ORDER — IOPAMIDOL (ISOVUE-370) INJECTION 76%
80.0000 mL | Freq: Once | INTRAVENOUS | Status: AC | PRN
  Administered 2015-09-29: 80 mL via INTRAVENOUS

## 2015-09-29 NOTE — Telephone Encounter (Signed)
Santiago Glad from the lab called. She stated that pt was a difficult stick and the CBC came back many platelet clumps. Would like this to be redrawn or resulted with the current results.

## 2015-09-29 NOTE — Telephone Encounter (Signed)
Please redraw although CBC

## 2015-09-29 NOTE — Telephone Encounter (Signed)
Pt is coming in today for redraw.

## 2015-09-29 NOTE — Telephone Encounter (Signed)
Pt stated that she would like to try something for back pain. Please send to local [facility].  Informed pt of CT scan appt today with time and location. Pt informed of Xray results and lab results. Pt will come back in today for CBC redraw.

## 2015-09-30 ENCOUNTER — Encounter: Payer: Self-pay | Admitting: Internal Medicine

## 2015-09-30 ENCOUNTER — Telehealth: Payer: Self-pay | Admitting: Emergency Medicine

## 2015-09-30 DIAGNOSIS — M546 Pain in thoracic spine: Secondary | ICD-10-CM

## 2015-09-30 DIAGNOSIS — M545 Low back pain: Secondary | ICD-10-CM

## 2015-09-30 LAB — URINE CULTURE: Organism ID, Bacteria: NO GROWTH

## 2015-09-30 MED ORDER — ETODOLAC ER 500 MG PO TB24: 500.0000 mg | ORAL_TABLET | Freq: Every day | ORAL | 0 refills | Status: DC

## 2015-09-30 NOTE — Telephone Encounter (Signed)
Pt wants to know if she can have the prescriptionetodolac (LODINE XL) 500 MG 24 hr tablet  sent to [facility] instead. Please advise thanks.

## 2015-09-30 NOTE — Telephone Encounter (Signed)
Short rx [facility] to local [facility].

## 2015-10-01 ENCOUNTER — Encounter: Payer: Self-pay | Admitting: Internal Medicine

## 2015-10-03 ENCOUNTER — Other Ambulatory Visit: Payer: Self-pay | Admitting: Internal Medicine

## 2015-10-03 DIAGNOSIS — M546 Pain in thoracic spine: Secondary | ICD-10-CM

## 2015-10-03 DIAGNOSIS — M545 Low back pain: Secondary | ICD-10-CM

## 2015-10-03 DIAGNOSIS — D892 Hypergammaglobulinemia, unspecified: Secondary | ICD-10-CM | POA: Insufficient documentation

## 2015-10-03 MED ORDER — ETODOLAC ER 500 MG PO TB24: 500.0000 mg | ORAL_TABLET | Freq: Every day | ORAL | 1 refills | Status: DC

## 2015-10-04 ENCOUNTER — Encounter: Payer: Self-pay | Admitting: Hematology and Oncology

## 2015-10-04 ENCOUNTER — Telehealth: Payer: Self-pay | Admitting: Hematology and Oncology

## 2015-10-04 NOTE — Telephone Encounter (Signed)
Pt requested to see Dr. Lindi Adie after her appt with Dr. Ronnald Ramp in 12/11 for her hem appt., pt confirmed appt, completed intake, mailed pt letter, in basket referring provider appt date/time

## 2015-10-11 ENCOUNTER — Encounter: Payer: BLUE CROSS/BLUE SHIELD | Admitting: Hematology and Oncology

## 2015-10-27 ENCOUNTER — Encounter: Payer: Self-pay | Admitting: Internal Medicine

## 2015-10-28 ENCOUNTER — Other Ambulatory Visit: Payer: Self-pay | Admitting: Internal Medicine

## 2015-10-28 DIAGNOSIS — K2100 Gastro-esophageal reflux disease with esophagitis, without bleeding: Secondary | ICD-10-CM

## 2015-10-28 MED ORDER — ESOMEPRAZOLE MAGNESIUM 40 MG PO CPDR: 40.0000 mg | DELAYED_RELEASE_CAPSULE | Freq: Every day | ORAL | 3 refills | Status: DC

## 2016-01-02 ENCOUNTER — Encounter: Payer: Self-pay | Admitting: Internal Medicine

## 2016-01-02 ENCOUNTER — Ambulatory Visit (INDEPENDENT_AMBULATORY_CARE_PROVIDER_SITE_OTHER): Payer: BLUE CROSS/BLUE SHIELD | Admitting: Internal Medicine

## 2016-01-02 ENCOUNTER — Other Ambulatory Visit (INDEPENDENT_AMBULATORY_CARE_PROVIDER_SITE_OTHER): Payer: BLUE CROSS/BLUE SHIELD

## 2016-01-02 VITALS — BP 118/78 | HR 77 | Temp 97.6°F | Resp 16 | Ht 59.0 in | Wt 177.2 lb

## 2016-01-02 DIAGNOSIS — E118 Type 2 diabetes mellitus with unspecified complications: Secondary | ICD-10-CM

## 2016-01-02 DIAGNOSIS — D892 Hypergammaglobulinemia, unspecified: Secondary | ICD-10-CM

## 2016-01-02 LAB — COMPREHENSIVE METABOLIC PANEL
ALT: 23 U/L (ref 0–35)
AST: 20 U/L (ref 0–37)
Albumin: 4.2 g/dL (ref 3.5–5.2)
Alkaline Phosphatase: 69 U/L (ref 39–117)
BUN: 14 mg/dL (ref 6–23)
CO2: 29 mEq/L (ref 19–32)
Calcium: 10.4 mg/dL (ref 8.4–10.5)
Chloride: 101 mEq/L (ref 96–112)
Creatinine, Ser: 1.05 mg/dL (ref 0.40–1.20)
GFR: 69.21 mL/min (ref >60.00)
Glucose, Bld: 103 mg/dL — ABNORMAL HIGH (ref 70–99)
Potassium: 4.1 mEq/L (ref 3.5–5.1)
Sodium: 138 mEq/L (ref 135–145)
Total Bilirubin: 0.3 mg/dL (ref 0.2–1.2)
Total Protein: 9.1 g/dL — ABNORMAL HIGH (ref 6.0–8.3)

## 2016-01-02 LAB — HEMOGLOBIN A1C: Hgb A1c MFr Bld: 6.9 % — ABNORMAL HIGH (ref 4.6–6.5)

## 2016-01-02 NOTE — Progress Notes (Signed)
Subjective:  Patient ID: Angel Gordon, female    DOB: [birth year]  Age: 60 y.o. MRN: [id number]  CC: Diabetes   HPI Angel Gordon presents for Follow-up on diabetes and history of elevated total protein. She feels well today and offers no complaints. She tells me her blood sugars been well controlled and she denies any episodes of polyuria, polydipsia, or polyphagia. She also denies night sweats, rash, lymphadenopathy, abdominal pain, weight loss, or paresthesias.  Outpatient Medications Prior to Visit  Medication Sig Dispense Refill  . clobetasol ointment (TEMOVATE) [id number] % Apply 1 application topically 2 (two) times daily. 45 g 1  . esomeprazole (NEXIUM) 40 MG capsule Take 1 capsule (40 mg total) by mouth daily at 12 noon. 90 capsule 3  . etodolac (LODINE XL) 500 MG 24 hr tablet Take 1 tablet (500 mg total) by mouth daily. 30 tablet 1  . glucose blood (COOL BLOOD GLUCOSE TEST STRIPS) test strip Use to test blood sugar twice per day. DXE11.9 200 each 3  . Lancets MISC Use to test blood sugar. DX E11.9 200 each 3  . metFORMIN (GLUCOPHAGE) 500 MG tablet TAKE 1 TABLET TWICE A DAY 180 tablet 1  . mometasone (NASONEX) 50 MCG/ACT nasal spray Place 2 sprays into the nose daily. 17 g 11  . triamcinolone cream (KENALOG) 0.1 % Apply 1 application topically 2 (two) times daily. 30 g 0   No facility-administered medications prior to visit.     ROS Review of Systems  Constitutional: Negative for appetite change, chills, diaphoresis, fatigue and fever.  HENT: Negative.  Negative for facial swelling, sore throat and trouble swallowing.   Eyes: Negative for visual disturbance.  Respiratory: Negative.  Negative for cough, chest tightness, shortness of breath and stridor.   Cardiovascular: Negative.  Negative for chest pain, palpitations and leg swelling.  Gastrointestinal: Negative.  Negative for abdominal pain, constipation, diarrhea, nausea and vomiting.  Endocrine: Negative.  Negative for  cold intolerance, heat intolerance, polydipsia, polyphagia and polyuria.  Genitourinary: Negative.  Negative for decreased urine volume, difficulty urinating, dysuria, frequency and urgency.  Musculoskeletal: Negative.  Negative for back pain and myalgias.  Skin: Negative.  Negative for color change, pallor and rash.  Allergic/Immunologic: Negative.   Neurological: Negative.   Hematological: Negative.  Negative for adenopathy. Does not bruise/bleed easily.  Psychiatric/Behavioral: Negative.     Objective:  BP 118/78 (BP Location: Left Arm, Patient Position: Sitting, Cuff Size: Large)   Pulse 77   Temp 97.6 F (36.4 C) (Oral)   Resp 16   Ht 4\' 11"  (1.499 m)   Wt 177 lb 4 oz (80.4 kg)   SpO2 96%   BMI 35.80 kg/m   BP Readings from Last 3 Encounters:  01/02/16 118/78  09/28/15 128/88  06/28/15 106/70    Wt Readings from Last 3 Encounters:  01/02/16 177 lb 4 oz (80.4 kg)  09/28/15 177 lb (80.3 kg)  06/28/15 173 lb (78.5 kg)    Physical Exam  Constitutional: She is oriented to person, place, and time. No distress.  HENT:  Mouth/Throat: Oropharynx is clear and moist. No oropharyngeal exudate.  Eyes: Conjunctivae are normal. Right eye exhibits no discharge. Left eye exhibits no discharge. No scleral icterus.  Neck: Normal range of motion. Neck supple. No JVD present. No tracheal deviation present. No thyromegaly present.  Cardiovascular: Normal rate, regular rhythm, normal heart sounds and intact distal pulses.  Exam reveals no gallop and no friction rub.   No murmur heard. Pulmonary/Chest:  Effort normal and breath sounds normal. No stridor. No respiratory distress. She has no wheezes. She has no rales. She exhibits no tenderness.  Abdominal: Soft. Bowel sounds are normal. She exhibits no distension and no mass. There is no tenderness. There is no rebound and no guarding.  Musculoskeletal: Normal range of motion. She exhibits no edema, tenderness or deformity.  Lymphadenopathy:         Head (right side): No submandibular and no occipital adenopathy present.       Head (left side): No submandibular and no occipital adenopathy present.    She has no cervical adenopathy.    She has no axillary adenopathy.       Right: No inguinal and no supraclavicular adenopathy present.       Left: No inguinal and no supraclavicular adenopathy present.  Neurological: She is oriented to person, place, and time.  Skin: Skin is warm and dry. No rash noted. She is not diaphoretic. No erythema. No pallor.  Vitals reviewed.   Lab Results  Component Value Date   WBC 6.3 09/29/2015   HGB 12.7 09/29/2015   HCT 38.6 09/29/2015   PLT 230.0 09/29/2015   GLUCOSE 103 (H) 01/02/2016   CHOL 117 06/28/2015   TRIG 55.0 06/28/2015   HDL 60.40 06/28/2015   LDLCALC 46 06/28/2015   ALT 23 01/02/2016   AST 20 01/02/2016   NA 138 01/02/2016   K 4.1 01/02/2016   CL 101 01/02/2016   CREATININE 1.05 01/02/2016   BUN 14 01/02/2016   CO2 29 01/02/2016   TSH 2.09 06/28/2015   HGBA1C 6.9 (H) 01/02/2016   MICROALBUR 1.1 06/28/2015    Ct Angio Chest Pe W Or Wo Contrast  Result Date: 09/29/2015 CLINICAL DATA:  Chest pain, back pain for 3-4 weeks EXAM: CT ANGIOGRAPHY CHEST WITH CONTRAST TECHNIQUE: Multidetector CT imaging of the chest was performed using the standard protocol during bolus administration of intravenous contrast. Multiplanar CT image reconstructions and MIPs were obtained to evaluate the vascular anatomy. CONTRAST:  80 mL Isovue 370 COMPARISON:  None. FINDINGS: Mediastinum/Lymph Nodes: No pulmonary emboli or thoracic aortic dissection identified. Normal heart size. No pericardial effusion. No masses or pathologically enlarged lymph nodes identified. Lungs/Pleura: No pulmonary mass, infiltrate, or effusion. Upper abdomen: No acute findings.  Small hiatal hernia. Musculoskeletal: No chest wall mass or suspicious bone lesions identified. Review of the MIP images confirms the above findings.  IMPRESSION: 1. No pulmonary embolus. 2. No thoracic aortic dissection. Electronically Signed   By: Kathreen Devoid   On: 09/29/2015 14:06    Total Protein, Serum Electrophoresis 6.1 - 8.1 g/dL 8.4    Albumin ELP 3.8 - 4.8 g/dL 3.7    Alpha-1-Globulin 0.2 - 0.3 g/dL 0.3   Alpha-2-Globulin 0.5 - 0.9 g/dL 0.9   Beta Globulin 0.4 - 0.6 g/dL 0.7    Beta 2 0.2 - 0.5 g/dL 0.8    Gamma Globulin 0.8 - 1.7 g/dL 2.0    Abnormal Protein Band1 g/dL NOT DET   SPE Interp.  SEE NOTE   Comments: A poorly defined band of restricted protein mobility is detected in  the beta globulins. It is unlikely this may represent a monoclonal  protein, however, immunofixation analysis is available if clinically  indicated.  Reviewed by Odis Hollingshead, MD, PhD, FCAP (Electronic Signature on  File     Assessment & Plan:   Angel Gordon was seen today for diabetes.  Diagnoses and all orders for this visit:  Type 2 diabetes  mellitus with complication, without long-term current use of insulin ([place])- her A1c is a 6.9%, her blood sugars are adequately well controlled on the current dose of metformin, will continue. -     Comprehensive metabolic panel; Future -     Hemoglobin A1c; Future  Paraproteinemia- The SPEP shows a poorly defined band of restricted protein mobility but the pathologist thinks it's unlikely that this represents a monoclonal protein. The patient remains very concerned about this so she will see a hematologist for a second opinion. -     Protein electrophoresis, serum; Future -     Comprehensive metabolic panel; Future   I am having Angel Gordon maintain her mometasone, glucose blood, Lancets, triamcinolone cream, clobetasol ointment, metFORMIN, etodolac, and esomeprazole.  No orders of the defined types were placed in this encounter.    Follow-up: Return in about 6 months (around 07/02/2016).  Angel Calico, MD

## 2016-01-02 NOTE — Patient Instructions (Signed)

## 2016-01-02 NOTE — Progress Notes (Signed)
Pre visit review using our clinic review tool, if applicable. No additional management support is needed unless otherwise documented below in the visit note. 

## 2016-01-03 ENCOUNTER — Encounter: Payer: Self-pay | Admitting: Internal Medicine

## 2016-01-04 ENCOUNTER — Encounter: Payer: Self-pay | Admitting: Internal Medicine

## 2016-01-04 LAB — PROTEIN ELECTROPHORESIS, SERUM
Albumin ELP: 3.7 g/dL — ABNORMAL LOW (ref 3.8–4.8)
Alpha-1-Globulin: 0.3 g/dL (ref 0.2–0.3)
Alpha-2-Globulin: 0.9 g/dL (ref 0.5–0.9)
Beta 2: 0.8 g/dL — ABNORMAL HIGH (ref 0.2–0.5)
Beta Globulin: 0.7 g/dL — ABNORMAL HIGH (ref 0.4–0.6)
Gamma Globulin: 2 g/dL — ABNORMAL HIGH (ref 0.8–1.7)
Total Protein, Serum Electrophoresis: 8.4 g/dL — ABNORMAL HIGH (ref 6.1–8.1)

## 2016-01-05 ENCOUNTER — Other Ambulatory Visit: Payer: Self-pay | Admitting: Internal Medicine

## 2016-01-05 NOTE — Telephone Encounter (Signed)
Should pt keep her appt with the hematologist. She is scheduled on 12/19. Please advise.

## 2016-01-06 NOTE — Telephone Encounter (Signed)
Pt given results and informed that Hematology appt should be kept.

## 2016-01-10 ENCOUNTER — Encounter: Payer: BLUE CROSS/BLUE SHIELD | Admitting: Hematology and Oncology

## 2016-01-11 ENCOUNTER — Ambulatory Visit (HOSPITAL_BASED_OUTPATIENT_CLINIC_OR_DEPARTMENT_OTHER): Payer: BLUE CROSS/BLUE SHIELD | Admitting: Hematology and Oncology

## 2016-01-11 ENCOUNTER — Encounter: Payer: Self-pay | Admitting: Hematology and Oncology

## 2016-01-11 DIAGNOSIS — D892 Hypergammaglobulinemia, unspecified: Secondary | ICD-10-CM

## 2016-01-11 DIAGNOSIS — E119 Type 2 diabetes mellitus without complications: Secondary | ICD-10-CM | POA: Diagnosis not present

## 2016-01-11 NOTE — Progress Notes (Signed)
[facility] NOTE  Patient Care Team: Janith Lima, MD as PCP - General (Internal Medicine)  CHIEF COMPLAINTS/PURPOSE OF CONSULTATION:  Elevated total protein  HISTORY OF PRESENTING ILLNESS:  Angel Gordon 60 y.o. female is here because of recent diagnosis of elevated total protein and especially the beta and gamma globulins. Patient has had several years increase in the total protein level. Recently she has noticed excessive fatigue and workup was done which revealed elevation of total protein because of this she underwent serum protein Dr. Teofilo Pod. There was no elevation of monoclonal protein. The beta and gamma globulins were increased. She was sent to [place] for further evaluation and discussion regarding this issue. Patient tells me that she has had long-standing arthritis in the back. Otherwise she is diabetic. Recently she had extensive scans and x-rays of her back. Low back pain. There was no evidence of any lytic lesions.  I reviewed her records extensively and collaborated the history with the patient.  MEDICAL HISTORY:  Past Medical History:  Diagnosis Date  . Type II or unspecified type diabetes mellitus without mention of complication, not stated as uncontrolled    metformin 564m bid-    SURGICAL HISTORY: Past Surgical History:  Procedure Laterality Date  . COLONOSCOPY  2010  . DILATION AND CURETTAGE OF UTERUS    . FOOT SURGERY  2002  . HYSTEROSCOPY W/D&C  08/17/2011   Procedure: DILATATION AND CURETTAGE /HYSTEROSCOPY;  Surgeon: GMarylynn Pearson MD;  [facility];  Service: Gynecology;  Laterality: N/A;  . TUBAL LIGATION      SOCIAL HISTORY: Social History   Social History  . Marital status: Married    Spouse name: N/A  . Number of children: 1  . Years of education: 12+   Occupational History  .  [employer]  . Smoking status: Never Smoker  . Smokeless tobacco: Never Used  . Alcohol use No  . Drug use:  No  . Sexual activity: Yes    Birth control/ protection: Post-menopausal   Other Topics Concern  . Not on file   Social History Narrative   HSG, [employer] - classes, married '78 - 3 years, divorced; married '96. 1 son ' '79; 3 grandchildren. work: [employer]   FAMILY HISTORY: Family History  Problem Relation Age of Onset  . Hypertension Mother   . Stroke Mother   . Kidney disease Mother   . Thyroid disease Mother   . Cancer Mother   . Heart disease Mother   . Cancer Maternal Grandmother     colon  . Kidney disease Maternal Grandmother   . Stroke Maternal Grandmother   . Hypertension Son   . Diabetes Brother   . Drug abuse Neg Hx   . Early death Neg Hx   . Hearing loss Neg Hx   . Hyperlipidemia Neg Hx   . Learning disabilities Neg Hx     ALLERGIES:  has No Known Allergies.  MEDICATIONS:  Current Outpatient Prescriptions  Medication Sig Dispense Refill  . clobetasol ointment (TEMOVATE) 08.93% Apply 1 application topically 2 (two) times daily. 45 g 1  . esomeprazole (NEXIUM) 40 MG capsule Take 1 capsule (40 mg total) by mouth daily at 12 noon. 90 capsule 3  . etodolac (LODINE XL) 500 MG 24 hr tablet Take 1 tablet (500 mg total) by mouth daily. 30 tablet 1  . glucose blood (COOL BLOOD GLUCOSE TEST STRIPS) test strip Use to test blood  sugar twice per day. DXE11.9 200 each 3  . Lancets MISC Use to test blood sugar. DX E11.9 200 each 3  . metFORMIN (GLUCOPHAGE) 500 MG tablet TAKE 1 TABLET TWICE A DAY 180 tablet 1  . mometasone (NASONEX) 50 MCG/ACT nasal spray Place 2 sprays into the nose daily. 17 g 11  . triamcinolone cream (KENALOG) 0.1 % Apply 1 application topically 2 (two) times daily. 30 g 0   No current facility-administered medications for this visit.     REVIEW OF SYSTEMS:   Constitutional: Denies fevers, chills or abnormal night sweats Eyes: Denies blurriness of vision, double vision or watery eyes Ears, nose, mouth, throat, and face: Denies mucositis or sore  throat Respiratory: Denies cough, dyspnea or wheezes Cardiovascular: Denies palpitation, chest discomfort or lower extremity swelling Gastrointestinal:  Denies nausea, heartburn or change in bowel habits Skin: Denies abnormal skin rashes Lymphatics: Denies new lymphadenopathy or easy bruising Neurological:Denies numbness, tingling or new weaknesses Behavioral/Psych: Mood is stable, no new changes   All other systems were reviewed with the patient and are negative.  PHYSICAL EXAMINATION: ECOG PERFORMANCE STATUS: 1 - Symptomatic but completely ambulatory  Vitals:   01/11/16 1216  BP: (!) 144/78  Pulse: 97  Resp: 18  Temp: 98 F (36.7 C)   Filed Weights   01/11/16 1216  Weight: 177 lb 8 oz (80.5 kg)    GENERAL:alert, no distress and comfortable SKIN: skin color, texture, turgor are normal, no rashes or significant lesions EYES: normal, conjunctiva are pink and non-injected, sclera clear OROPHARYNX:no exudate, no erythema and lips, buccal mucosa, and tongue normal  NECK: supple, thyroid normal size, non-tender, without nodularity LYMPH:  no palpable lymphadenopathy in the cervical, axillary or inguinal LUNGS: clear to auscultation and percussion with normal breathing effort HEART: regular rate & rhythm and no murmurs and no lower extremity edema ABDOMEN:abdomen soft, non-tender and normal bowel sounds Musculoskeletal:no cyanosis of digits and no clubbing  PSYCH: alert & oriented x 3 with fluent speech NEURO: no focal motor/sensory deficits  LABORATORY DATA:  I have reviewed the data as listed Lab Results  Component Value Date   WBC 6.3 09/29/2015   HGB 12.7 09/29/2015   HCT 38.6 09/29/2015   MCV 86.0 09/29/2015   PLT 230.0 09/29/2015   Lab Results  Component Value Date   NA 138 01/02/2016   K 4.1 01/02/2016   CL 101 01/02/2016   CO2 29 01/02/2016    RADIOGRAPHIC STUDIES: I have personally reviewed the radiological reports and agreed with the findings in the  report.  ASSESSMENT AND PLAN:   Elevated Beta and gamma globulins: Patient had extensive blood work for food severe fatigue. In the blood work she was noted to have elevated total serum protein. And this has been going up and down over the past several years. Because of these 2 symptoms, she underwent serum protein Dr. Teofilo Pod. Electrophoresis did not reveal any elevation of monoclonal protein. In order to further clarify on this abnormality she was sent was for further evaluation.  Discussion: I discussed with the patient the significance of elevated protein and the different types of proteins between albumin sent globulins. We discussed the difference. Beta globulins and gamma globulins. Elevation of these 2 globulins in her case without any elevation of monoclonal protein is suggestive of an inflammatory origin. It appears the patient has profound arthritis. That could be the reason for the elevated globulins. I do not think there is any concern for MGUS or multiple myeloma. I  reviewed the extensive x-rays as well as the scans that she had on her body. There is no evidence of lytic lesions are concerns for myeloma.  Because of this, I did not recommend any further workup that we could see the patient back as needed.  All questions were answered. The patient knows to call the clinic with any problems, questions or concerns.    Rulon Eisenmenger, MD 01/11/16

## 2016-01-11 NOTE — Assessment & Plan Note (Signed)
Elevated Beta and gamma globulins: Patient had extensive blood work for food severe fatigue. In the blood work she was noted to have elevated total serum protein. And this has been going up and down over the past several years. Because of these 2 symptoms, she underwent serum protein Dr. Teofilo Pod. Electrophoresis did not reveal any elevation of monoclonal protein. In order to further clarify on this abnormality she was sent was for further evaluation.  Discussion: I discussed with the patient the significance of elevated protein and the different types of proteins between albumin sent globulins. We discussed the difference. Beta globulins and gamma globulins. Elevation of these 2 globulins in her case without any elevation of monoclonal protein is suggestive of an inflammatory origin. It appears the patient has profound arthritis. That could be the reason for the elevated globulins. I do not think there is any concern for MGUS or multiple myeloma. I reviewed the extensive x-rays as well as the scans that she had on her body. There is no evidence of lytic lesions are concerns for myeloma.   Because of these are did not recommend any further workup that we could see the patient back as needed.

## 2016-02-06 ENCOUNTER — Other Ambulatory Visit: Payer: Self-pay | Admitting: Internal Medicine

## 2016-02-06 ENCOUNTER — Encounter: Payer: Self-pay | Admitting: Internal Medicine

## 2016-02-06 MED ORDER — HYDROCODONE-HOMATROPINE 5-1.5 MG/5ML PO SYRP: 5.0000 mL | ORAL_SOLUTION | Freq: Three times a day (TID) | ORAL | 0 refills | Status: DC | PRN

## 2016-02-07 LAB — HM DIABETES EYE EXAM

## 2016-03-08 ENCOUNTER — Ambulatory Visit: Payer: BLUE CROSS/BLUE SHIELD | Admitting: Podiatry

## 2016-03-12 ENCOUNTER — Encounter: Payer: Self-pay | Admitting: Podiatry

## 2016-03-12 ENCOUNTER — Ambulatory Visit (INDEPENDENT_AMBULATORY_CARE_PROVIDER_SITE_OTHER): Payer: BLUE CROSS/BLUE SHIELD | Admitting: Podiatry

## 2016-03-12 ENCOUNTER — Ambulatory Visit (INDEPENDENT_AMBULATORY_CARE_PROVIDER_SITE_OTHER): Payer: BLUE CROSS/BLUE SHIELD

## 2016-03-12 DIAGNOSIS — B351 Tinea unguium: Secondary | ICD-10-CM | POA: Diagnosis not present

## 2016-03-12 DIAGNOSIS — M205X2 Other deformities of toe(s) (acquired), left foot: Secondary | ICD-10-CM

## 2016-03-12 DIAGNOSIS — M79676 Pain in unspecified toe(s): Secondary | ICD-10-CM

## 2016-03-12 NOTE — Progress Notes (Signed)
Subjective:     Patient ID: Angel Gordon, female   DOB: 1955/06/10, 61 y.o.   MRN: [id number]  HPI patient presents with limitation of motion left big toe joint and thickened nailbeds 1-5 both feet she cannot cut they are difficult to deal with   Review of Systems     Objective:   Physical Exam Neurovascular status intact muscle strength adequate range of motion was limited first MPJ left with patient found have thickened Riddle nailbeds 1-5 both feet that are moderately tender    Assessment:     Mycotic nail infections with pain 1-5 both feet along with hallux limitus deformity left with pain in the nailbeds    Plan:     H&P conditions reviewed and debridement accomplished. Reviewed the hallux limitus and discussed long-term possibility for surgical intervention if he gets worse

## 2016-04-16 ENCOUNTER — Other Ambulatory Visit: Payer: Self-pay | Admitting: Internal Medicine

## 2016-05-07 ENCOUNTER — Encounter: Payer: Self-pay | Admitting: Internal Medicine

## 2016-05-07 ENCOUNTER — Other Ambulatory Visit: Payer: Self-pay | Admitting: Internal Medicine

## 2016-05-07 ENCOUNTER — Telehealth: Payer: Self-pay | Admitting: Internal Medicine

## 2016-05-07 DIAGNOSIS — J301 Allergic rhinitis due to pollen: Secondary | ICD-10-CM

## 2016-05-07 MED ORDER — TRIAMCINOLONE ACETONIDE 55 MCG/ACT NA AERO: 2.0000 | INHALATION_SPRAY | Freq: Every day | NASAL | 3 refills | Status: DC

## 2016-05-07 MED ORDER — LEVOCETIRIZINE DIHYDROCHLORIDE 5 MG PO TABS: 5.0000 mg | ORAL_TABLET | Freq: Every evening | ORAL | 3 refills | Status: DC

## 2016-05-07 NOTE — Telephone Encounter (Signed)
Pt called requesting a prescription for allergies. She said that she is having bad allergies right now with drainage. Please advise.

## 2016-05-07 NOTE — Telephone Encounter (Signed)
My chart message has been forwarded to PCP. Closing this phone note.

## 2016-05-08 ENCOUNTER — Encounter: Payer: Self-pay | Admitting: Internal Medicine

## 2016-07-04 ENCOUNTER — Other Ambulatory Visit: Payer: Self-pay | Admitting: Internal Medicine

## 2016-07-10 ENCOUNTER — Ambulatory Visit (INDEPENDENT_AMBULATORY_CARE_PROVIDER_SITE_OTHER): Payer: BLUE CROSS/BLUE SHIELD | Admitting: Internal Medicine

## 2016-07-10 ENCOUNTER — Encounter: Payer: Self-pay | Admitting: Internal Medicine

## 2016-07-10 ENCOUNTER — Other Ambulatory Visit (INDEPENDENT_AMBULATORY_CARE_PROVIDER_SITE_OTHER): Payer: BLUE CROSS/BLUE SHIELD

## 2016-07-10 VITALS — BP 118/82 | HR 76 | Temp 97.8°F | Resp 16 | Ht 59.0 in | Wt 175.0 lb

## 2016-07-10 DIAGNOSIS — K2100 Gastro-esophageal reflux disease with esophagitis, without bleeding: Secondary | ICD-10-CM

## 2016-07-10 DIAGNOSIS — E118 Type 2 diabetes mellitus with unspecified complications: Secondary | ICD-10-CM

## 2016-07-10 DIAGNOSIS — D892 Hypergammaglobulinemia, unspecified: Secondary | ICD-10-CM

## 2016-07-10 DIAGNOSIS — R3129 Other microscopic hematuria: Secondary | ICD-10-CM

## 2016-07-10 DIAGNOSIS — Z0000 Encounter for general adult medical examination without abnormal findings: Secondary | ICD-10-CM | POA: Diagnosis not present

## 2016-07-10 LAB — CBC WITH DIFFERENTIAL/PLATELET
Basophils Absolute: 0.1 10*3/uL (ref 0.0–0.1)
Basophils Relative: 0.7 % (ref 0.0–3.0)
Eosinophils Absolute: 0.4 10*3/uL (ref 0.0–0.7)
Eosinophils Relative: 3.8 % (ref 0.0–5.0)
HCT: 40.8 % (ref 36.0–46.0)
Hemoglobin: 13.1 g/dL (ref 12.0–15.0)
Lymphocytes Relative: 47.5 % — ABNORMAL HIGH (ref 12.0–46.0)
Lymphs Abs: 5.2 10*3/uL — ABNORMAL HIGH (ref 0.7–4.0)
MCHC: 32.1 g/dL (ref 30.0–36.0)
MCV: 86.4 fl (ref 78.0–100.0)
Monocytes Absolute: 0.9 10*3/uL (ref 0.1–1.0)
Monocytes Relative: 8.5 % (ref 3.0–12.0)
Neutro Abs: 4.3 10*3/uL (ref 1.4–7.7)
Neutrophils Relative %: 39.5 % — ABNORMAL LOW (ref 43.0–77.0)
Platelets: 233 10*3/uL (ref 150.0–400.0)
RBC: 4.72 Mil/uL (ref 3.87–5.11)
RDW: 14.3 % (ref 11.5–15.5)
WBC: 10.9 10*3/uL — ABNORMAL HIGH (ref 4.0–10.5)

## 2016-07-10 LAB — COMPREHENSIVE METABOLIC PANEL
ALT: 21 U/L (ref 0–35)
AST: 22 U/L (ref 0–37)
Albumin: 4.2 g/dL (ref 3.5–5.2)
Alkaline Phosphatase: 69 U/L (ref 39–117)
BUN: 19 mg/dL (ref 6–23)
CO2: 28 mEq/L (ref 19–32)
Calcium: 10.5 mg/dL (ref 8.4–10.5)
Chloride: 102 mEq/L (ref 96–112)
Creatinine, Ser: 1.05 mg/dL (ref 0.40–1.20)
GFR: 69.09 mL/min (ref >60.00)
Glucose, Bld: 89 mg/dL (ref 70–99)
Potassium: 4.3 mEq/L (ref 3.5–5.1)
Sodium: 138 mEq/L (ref 135–145)
Total Bilirubin: 0.3 mg/dL (ref 0.2–1.2)
Total Protein: 8.6 g/dL — ABNORMAL HIGH (ref 6.0–8.3)

## 2016-07-10 LAB — LIPID PANEL
Cholesterol: 123 mg/dL (ref 0–200)
HDL: 60.9 mg/dL (ref >39.00)
LDL Cholesterol: 50 mg/dL (ref 0–99)
NonHDL: 62.13
Total CHOL/HDL Ratio: 2
Triglycerides: 60 mg/dL (ref 0.0–149.0)
VLDL: 12 mg/dL (ref 0.0–40.0)

## 2016-07-10 LAB — URINALYSIS, ROUTINE W REFLEX MICROSCOPIC
Bilirubin Urine: NEGATIVE
Ketones, ur: NEGATIVE
Leukocytes, UA: NEGATIVE
Nitrite: NEGATIVE
Specific Gravity, Urine: 1.025 (ref 1.000–1.030)
Total Protein, Urine: NEGATIVE
Urine Glucose: NEGATIVE
Urobilinogen, UA: 0.2 (ref 0.0–1.0)
pH: 5.5 (ref 5.0–8.0)

## 2016-07-10 LAB — HEMOGLOBIN A1C: Hgb A1c MFr Bld: 7.2 % — ABNORMAL HIGH (ref 4.6–6.5)

## 2016-07-10 LAB — TSH: TSH: 1.63 u[IU]/mL (ref 0.35–4.50)

## 2016-07-10 LAB — MICROALBUMIN / CREATININE URINE RATIO
Creatinine,U: 143.7 mg/dL
Microalb Creat Ratio: 0.5 mg/g (ref 0.0–30.0)
Microalb, Ur: <0.7 mg/dL (ref 0.0–1.9)

## 2016-07-10 NOTE — Progress Notes (Signed)
Subjective:  Patient ID: Angel Gordon, female    DOB: 03/09/1955  Age: 61 y.o. MRN: [id number]  CC: Annual Exam; Hyperlipidemia; and Diabetes   HPI Angel Gordon presents for CPX and f/up - She feels well and offers no complaints.  Outpatient Medications Prior to Visit  Medication Sig Dispense Refill  . esomeprazole (NEXIUM) 40 MG capsule Take 1 capsule (40 mg total) by mouth daily at 12 noon. 90 capsule 3  . glucose blood (COOL BLOOD GLUCOSE TEST STRIPS) test strip Use to test blood sugar twice per day. DXE11.9 200 each 3  . Lancets MISC Use to test blood sugar. DX E11.9 200 each 3  . metFORMIN (GLUCOPHAGE) 500 MG tablet TAKE 1 TABLET TWICE A DAY 180 tablet 0  . triamcinolone (NASACORT AQ) 55 MCG/ACT AERO nasal inhaler Place 2 sprays into the nose daily. 32.4 mL 3  . clobetasol ointment (TEMOVATE) 9.98 % Apply 1 application topically 2 (two) times daily. 45 g 1  . etodolac (LODINE XL) 500 MG 24 hr tablet Take 1 tablet (500 mg total) by mouth daily. 30 tablet 1  . HYDROcodone-homatropine (HYCODAN) 5-1.5 MG/5ML syrup Take 5 mLs by mouth every 8 (eight) hours as needed for cough. 120 mL 0  . levocetirizine (XYZAL) 5 MG tablet Take 1 tablet (5 mg total) by mouth every evening. 90 tablet 3  . triamcinolone cream (KENALOG) 0.1 % Apply 1 application topically 2 (two) times daily. 30 g 0   No facility-administered medications prior to visit.     ROS Review of Systems  Constitutional: Negative.  Negative for appetite change, chills, diaphoresis, fatigue and fever.  HENT: Negative for sore throat and voice change.   Eyes: Negative.  Negative for visual disturbance.  Respiratory: Negative.  Negative for cough, chest tightness, shortness of breath, wheezing and stridor.   Cardiovascular: Negative.  Negative for chest pain, palpitations and leg swelling.  Gastrointestinal: Negative.  Negative for abdominal pain, constipation, diarrhea, nausea and vomiting.  Endocrine: Negative.   Negative for polydipsia, polyphagia and polyuria.  Genitourinary: Negative.  Negative for difficulty urinating, dysuria, frequency and vaginal bleeding.  Musculoskeletal: Negative.  Negative for back pain, myalgias and neck pain.  Skin: Negative.  Negative for color change and rash.  Allergic/Immunologic: Negative.   Neurological: Negative.   Hematological: Negative for adenopathy. Does not bruise/bleed easily.  Psychiatric/Behavioral: Negative.     Objective:  BP 118/82 (BP Location: Left Arm, Patient Position: Sitting, Cuff Size: Large)   Pulse 76   Temp 97.8 F (36.6 C) (Oral)   Ht 4\' 11"  (1.499 m)   Wt 175 lb (79.4 kg)   SpO2 98%   BMI 35.35 kg/m   BP Readings from Last 3 Encounters:  07/10/16 118/82  01/11/16 (!) 144/78  01/02/16 118/78    Wt Readings from Last 3 Encounters:  07/10/16 175 lb (79.4 kg)  01/11/16 177 lb 8 oz (80.5 kg)  01/02/16 177 lb 4 oz (80.4 kg)    Physical Exam  Constitutional: She is oriented to person, place, and time. No distress.  HENT:  Mouth/Throat: Oropharynx is clear and moist. No oropharyngeal exudate.  Eyes: Conjunctivae are normal. Right eye exhibits no discharge. No scleral icterus.  Neck: Normal range of motion. Neck supple. No JVD present. No thyromegaly present.  Cardiovascular: Normal rate, regular rhythm and intact distal pulses.  Exam reveals no gallop.   No murmur heard. Pulmonary/Chest: Effort normal and breath sounds normal. No respiratory distress. She has no wheezes. She  has no rales. She exhibits no tenderness.  Abdominal: Soft. Bowel sounds are normal. She exhibits no distension and no mass. There is hepatosplenomegaly. There is no tenderness. There is no rebound and no guarding.  Musculoskeletal: Normal range of motion. She exhibits no edema or tenderness.  Lymphadenopathy:       Head (right side): No submental, no submandibular, no tonsillar, no preauricular, no posterior auricular and no occipital adenopathy present.        Head (left side): No submental, no submandibular, no tonsillar and no posterior auricular adenopathy present.    She has no cervical adenopathy.       Right cervical: No superficial cervical, no deep cervical and no posterior cervical adenopathy present.      Left cervical: No deep cervical and no posterior cervical adenopathy present.       Right axillary: No pectoral and no lateral adenopathy present.       Left axillary: No pectoral and no lateral adenopathy present.      Right: No inguinal and no supraclavicular adenopathy present.       Left: No inguinal and no supraclavicular adenopathy present.  Neurological: She is alert and oriented to person, place, and time.  Skin: Skin is warm and dry. No rash noted. She is not diaphoretic. No erythema. No pallor.  Vitals reviewed.   Lab Results  Component Value Date   WBC 6.3 09/29/2015   HGB 12.7 09/29/2015   HCT 38.6 09/29/2015   PLT 230.0 09/29/2015   GLUCOSE 103 (H) 01/02/2016   CHOL 117 06/28/2015   TRIG 55.0 06/28/2015   HDL 60.40 06/28/2015   LDLCALC 46 06/28/2015   ALT 23 01/02/2016   AST 20 01/02/2016   NA 138 01/02/2016   K 4.1 01/02/2016   CL 101 01/02/2016   CREATININE 1.05 01/02/2016   BUN 14 01/02/2016   CO2 29 01/02/2016   TSH 2.09 06/28/2015   HGBA1C 6.9 (H) 01/02/2016   MICROALBUR 1.1 06/28/2015    Ct Angio Chest Pe W Or Wo Contrast  Result Date: 09/29/2015 CLINICAL DATA:  Chest pain, back pain for 3-4 weeks EXAM: CT ANGIOGRAPHY CHEST WITH CONTRAST TECHNIQUE: Multidetector CT imaging of the chest was performed using the standard protocol during bolus administration of intravenous contrast. Multiplanar CT image reconstructions and MIPs were obtained to evaluate the vascular anatomy. CONTRAST:  80 mL Isovue 370 COMPARISON:  None. FINDINGS: Mediastinum/Lymph Nodes: No pulmonary emboli or thoracic aortic dissection identified. Normal heart size. No pericardial effusion. No masses or pathologically enlarged lymph  nodes identified. Lungs/Pleura: No pulmonary mass, infiltrate, or effusion. Upper abdomen: No acute findings.  Small hiatal hernia. Musculoskeletal: No chest wall mass or suspicious bone lesions identified. Review of the MIP images confirms the above findings. IMPRESSION: 1. No pulmonary embolus. 2. No thoracic aortic dissection. Electronically Signed   By: Kathreen Devoid   On: 09/29/2015 14:06    Assessment & Plan:   Angel Gordon was seen today for annual exam, hyperlipidemia and diabetes.  Diagnoses and all orders for this visit:  Type 2 diabetes mellitus with complication, without long-term current use of insulin ([place])- her A1c is 6.9%, her blood sugars are adequately well controlled. -     Comprehensive metabolic panel; Future -     Hemoglobin A1c; Future -     TSH; Future -     Microalbumin / creatinine urine ratio; Future  Paraproteinemia- she has no symptoms of lymphoproliferative disease and her exam is negative for lymphadenopathy. Repeat SPEP  shows that this is a polyclonal abnormality for her and not consistent with lymphoproliferative disease but is more consistent with chronic inflammation. I will continue to monitor this in the future. -     Protein electrophoresis, serum; Future -     Comprehensive metabolic panel; Future -     CBC with Differential/Platelet; Future  Routine health maintenance- exam completed, labs ordered and reviewed, colon cancer screening is up-to-date, Pap is up-to-date, she has an upcoming mammogram, she refused all vaccines today, patient education material was given. -     Lipid panel; Future  Gastroesophageal reflux disease with esophagitis- her symptoms are well controlled with a PPI and there is no evidence of complications. -     CBC with Differential/Platelet; Future  Hematuria, microscopic- this has resolved -     Urinalysis, Routine w reflex microscopic; Future   I have discontinued Angel Gordon's triamcinolone cream, clobetasol ointment, etodolac,  HYDROcodone-homatropine, and levocetirizine. I am also having her maintain her glucose blood, Lancets, esomeprazole, metFORMIN, and triamcinolone.  No orders of the defined types were placed in this encounter.    Follow-up: No Follow-up on file.  Angel Calico, MD

## 2016-07-10 NOTE — Patient Instructions (Signed)

## 2016-07-12 LAB — PROTEIN ELECTROPHORESIS, SERUM
Albumin ELP: 3.7 g/dL — ABNORMAL LOW (ref 3.8–4.8)
Alpha-1-Globulin: 0.3 g/dL (ref 0.2–0.3)
Alpha-2-Globulin: 0.9 g/dL (ref 0.5–0.9)
Beta 2: 0.8 g/dL — ABNORMAL HIGH (ref 0.2–0.5)
Beta Globulin: 0.7 g/dL — ABNORMAL HIGH (ref 0.4–0.6)
Gamma Globulin: 2 g/dL — ABNORMAL HIGH (ref 0.8–1.7)
Total Protein, Serum Electrophoresis: 8.5 g/dL — ABNORMAL HIGH (ref 6.1–8.1)

## 2016-07-12 LAB — HM MAMMOGRAPHY: HM Mammogram: NORMAL (ref 0–4)

## 2016-07-13 ENCOUNTER — Encounter: Payer: Self-pay | Admitting: Internal Medicine

## 2016-07-23 ENCOUNTER — Encounter: Payer: Self-pay | Admitting: Internal Medicine

## 2016-07-23 ENCOUNTER — Encounter: Payer: Self-pay | Admitting: Hematology and Oncology

## 2016-07-23 NOTE — Telephone Encounter (Signed)
Are there any specific symptoms? Gathering information before I call her back.

## 2016-07-23 NOTE — Telephone Encounter (Signed)
Pt contacted and informed of what the results were and what symptoms to look for.

## 2016-07-26 ENCOUNTER — Encounter: Payer: Self-pay | Admitting: Internal Medicine

## 2016-07-26 ENCOUNTER — Telehealth: Payer: Self-pay | Admitting: Medical

## 2016-07-26 NOTE — Telephone Encounter (Signed)
Pt here with her grandaughter. She has some rt side flank pain. Looks uncomfortable. She states she has been tyring to see her pcp and sending messages. Can't get through when she calls. Can you try back line tomorrow and see if they can work her in.   If they can't see her I would be willing to see her as we are approaching weekend. Per pt they already have some info on this pain and done some initial work up per pt so would be best they see her.

## 2016-07-26 NOTE — Telephone Encounter (Signed)
I have never seen pt. I saw her grandaughter tonight. She/Angel Gordon should have been given 30 minute appointment since new to me in addition to nature of complaint. So block another 15 minute slot later in the morning.  She is on my schedule for Friday at 8:15.

## 2016-07-27 ENCOUNTER — Encounter: Payer: Self-pay | Admitting: Medical

## 2016-07-27 ENCOUNTER — Ambulatory Visit (HOSPITAL_BASED_OUTPATIENT_CLINIC_OR_DEPARTMENT_OTHER)
Admission: RE | Admit: 2016-07-27 | Discharge: 2016-07-27 | Disposition: A | Discharge status: Home / Self Care | Payer: BLUE CROSS/BLUE SHIELD | Source: Ambulatory Visit | Attending: Medical | Admitting: Medical

## 2016-07-27 ENCOUNTER — Ambulatory Visit (INDEPENDENT_AMBULATORY_CARE_PROVIDER_SITE_OTHER): Payer: BLUE CROSS/BLUE SHIELD | Admitting: Medical

## 2016-07-27 VITALS — BP 135/72 | HR 65 | Temp 97.8°F | Resp 16 | Ht 59.0 in | Wt 176.4 lb

## 2016-07-27 DIAGNOSIS — R0781 Pleurodynia: Secondary | ICD-10-CM | POA: Diagnosis not present

## 2016-07-27 DIAGNOSIS — N200 Calculus of kidney: Secondary | ICD-10-CM | POA: Insufficient documentation

## 2016-07-27 DIAGNOSIS — R109 Unspecified abdominal pain: Secondary | ICD-10-CM

## 2016-07-27 DIAGNOSIS — R932 Abnormal findings on diagnostic imaging of liver and biliary tract: Secondary | ICD-10-CM | POA: Insufficient documentation

## 2016-07-27 LAB — COMPREHENSIVE METABOLIC PANEL
ALT: 52 U/L — ABNORMAL HIGH (ref 0–35)
AST: 48 U/L — ABNORMAL HIGH (ref 0–37)
Albumin: 3.8 g/dL (ref 3.5–5.2)
Alkaline Phosphatase: 68 U/L (ref 39–117)
BUN: 17 mg/dL (ref 6–23)
CO2: 25 mEq/L (ref 19–32)
Calcium: 10.2 mg/dL (ref 8.4–10.5)
Chloride: 106 mEq/L (ref 96–112)
Creatinine, Ser: 1.02 mg/dL (ref 0.40–1.20)
GFR: 71.42 mL/min (ref >60.00)
Glucose, Bld: 106 mg/dL — ABNORMAL HIGH (ref 70–99)
Potassium: 4.1 mEq/L (ref 3.5–5.1)
Sodium: 141 mEq/L (ref 135–145)
Total Bilirubin: 0.2 mg/dL (ref 0.2–1.2)
Total Protein: 8.4 g/dL — ABNORMAL HIGH (ref 6.0–8.3)

## 2016-07-27 LAB — POC URINALSYSI DIPSTICK (AUTOMATED)
Bilirubin, UA: NEGATIVE
Blood, UA: NEGATIVE
Glucose, UA: NEGATIVE
Ketones, UA: NEGATIVE
Leukocytes, UA: NEGATIVE
Nitrite, UA: NEGATIVE
Protein, UA: NEGATIVE
Spec Grav, UA: >=1.03 — AB (ref 1.010–1.025)
Urobilinogen, UA: NEGATIVE E.U./dL — AB
pH, UA: 6 (ref 5.0–8.0)

## 2016-07-27 LAB — CBC WITH DIFFERENTIAL/PLATELET
Basophils Absolute: 0.1 10*3/uL (ref 0.0–0.1)
Basophils Relative: 1.4 % (ref 0.0–3.0)
Eosinophils Absolute: 0.3 10*3/uL (ref 0.0–0.7)
Eosinophils Relative: 6.2 % — ABNORMAL HIGH (ref 0.0–5.0)
HCT: 39.6 % (ref 36.0–46.0)
Hemoglobin: 12.8 g/dL (ref 12.0–15.0)
Lymphocytes Relative: 44.4 % (ref 12.0–46.0)
Lymphs Abs: 2.3 10*3/uL (ref 0.7–4.0)
MCHC: 32.3 g/dL (ref 30.0–36.0)
MCV: 87.1 fl (ref 78.0–100.0)
Monocytes Absolute: 0.5 10*3/uL (ref 0.1–1.0)
Monocytes Relative: 10.5 % (ref 3.0–12.0)
Neutro Abs: 1.9 10*3/uL (ref 1.4–7.7)
Neutrophils Relative %: 37.5 % — ABNORMAL LOW (ref 43.0–77.0)
Platelets: 223 10*3/uL (ref 150.0–400.0)
RBC: 4.55 Mil/uL (ref 3.87–5.11)
RDW: 14.5 % (ref 11.5–15.5)
WBC: 5.2 10*3/uL (ref 4.0–10.5)

## 2016-07-27 MED ORDER — TRAMADOL HCL 50 MG PO TABS: 50.0000 mg | ORAL_TABLET | Freq: Four times a day (QID) | ORAL | 0 refills | Status: DC | PRN

## 2016-07-27 MED ORDER — FAMCICLOVIR 500 MG PO TABS: 500.0000 mg | ORAL_TABLET | Freq: Two times a day (BID) | ORAL | 0 refills | Status: DC

## 2016-07-27 NOTE — Progress Notes (Signed)
Subjective:    Patient ID: Angel Gordon, female    DOB: 1955/08/01, 61 y.o.   MRN: [id number]  HPI  Pt in for rt flank/rt rib area pain. Pain for about a week. Pain just comes and goes. No association with movement. No rash to skin. Pt states pain can last for 2-3 hours when she has. Pt states last night responded to ibuprofen. Pt does report hx of upset of stomach. But never with pain on rt side. No nausea, no vomiting or constipation. Occasional intermittent loose stools for years but not recently.  No pain when she urinates. No frequent urination.  Pt had ekg in 2017. Was normal. No chest pain.    Review of Systems  Constitutional: Negative for chills, fatigue and fever.  Respiratory: Negative for choking, chest tightness, shortness of breath and wheezing.   Cardiovascular: Negative for chest pain and palpitations.  Gastrointestinal: Positive for abdominal pain. Negative for abdominal distention, blood in stool, constipation, nausea and vomiting.       Not currently but hx of intermittent mild epigastric pain when eats.  Genitourinary: Negative for difficulty urinating, dysuria, frequency, pelvic pain and urgency.  Musculoskeletal: Negative for back pain.       Rare back pain rt cva with rt rib pain.  Rt shoulder. Rare transient tingle sensatin type pain. Non presently.  Skin: Negative for rash.  Neurological: Negative for dizziness, weakness, numbness and headaches.  Hematological: Negative for adenopathy. Does not bruise/bleed easily.  Psychiatric/Behavioral: Negative for agitation, behavioral problems, confusion, dysphoric mood and hallucinations.   Past Medical History:  Diagnosis Date  . Type II or unspecified type diabetes mellitus without mention of complication, not stated as uncontrolled    metformin 500mg  bid-     Social History   Social History  . Marital status: Married    Spouse name: N/A  . Number of children: 1  . Years of education: 12+    Occupational History  .  [employer] Main Topics  . Smoking status: Never Smoker  . Smokeless tobacco: Never Used  . Alcohol use No  . Drug use: No  . Sexual activity: Yes    Birth control/ protection: Post-menopausal   Other Topics Concern  . Not on file   Social History Narrative   HSG, [employer] - classes, married '78 - 3 years, divorced; married '96. 1 son ' '79; 3 grandchildren. work: [employer]    Past Surgical History:  Procedure Laterality Date  . COLONOSCOPY  2010  . DILATION AND CURETTAGE OF UTERUS    . FOOT SURGERY  2002  . HYSTEROSCOPY W/D&C  08/17/2011   Procedure: DILATATION AND CURETTAGE /HYSTEROSCOPY;  Surgeon: Marylynn Pearson, MD;  Location: [facility];  Service: Gynecology;  Laterality: N/A;  . TUBAL LIGATION      Family History  Problem Relation Age of Onset  . Hypertension Mother   . Stroke Mother   . Kidney disease Mother   . Thyroid disease Mother   . Cancer Mother   . Heart disease Mother   . Hypertension Son   . Diabetes Brother   . Cancer Maternal Grandmother        colon  . Kidney disease Maternal Grandmother   . Stroke Maternal Grandmother   . Drug abuse Neg Hx   . Early death Neg Hx   . Hearing loss Neg Hx   . Hyperlipidemia Neg Hx   . Learning disabilities Neg Hx     No Known  Allergies  Current Outpatient Prescriptions on File Prior to Visit  Medication Sig Dispense Refill  . esomeprazole (NEXIUM) 40 MG capsule Take 1 capsule (40 mg total) by mouth daily at 12 noon. 90 capsule 3  . glucose blood (COOL BLOOD GLUCOSE TEST STRIPS) test strip Use to test blood sugar twice per day. DXE11.9 200 each 3  . Lancets MISC Use to test blood sugar. DX E11.9 200 each 3  . metFORMIN (GLUCOPHAGE) 500 MG tablet TAKE 1 TABLET TWICE A DAY 180 tablet 0  . triamcinolone (NASACORT AQ) 55 MCG/ACT AERO nasal inhaler Place 2 sprays into the nose daily. 32.4 mL 3   No current facility-administered medications on file prior to visit.     BP 135/72  (BP Location: Left Arm, Patient Position: Sitting, Cuff Size: Normal)   Pulse 65   Temp 97.8 F (36.6 C) (Oral)   Resp 16   Ht 4\' 11"  (1.499 m)   Wt 176 lb 6.4 oz (80 kg)   SpO2 100%   BMI 35.63 kg/m       Objective:   Physical Exam  General Appearance- Not in acute distress.  HEENT Eyes- Scleraeral/Conjuntiva-bilat- Not Yellow. Mouth & Throat- Normal.  Chest and Lung Exam Auscultation: Breath sounds:-Normal. Adventitious sounds:- No Adventitious sounds.  Cardiovascular Auscultation:Rythm - Regular. Heart Sounds -Normal heart sounds.  Abdomen Inspection:-Inspection Normal.  Palpation/Perucssion: Palpation and Percussion of the abdomen reveal- Non Tender, No Rebound tenderness, No rigidity(Guarding) and No Palpable abdominal masses.  Liver:-Normal.  Spleen:- Normal.   Back- no cva pain presently(some rt flank/rt lower rib pain up until the edge of rt upper quadrant)  Skin- no rash reported on pt flank area.(per pt)  Rt shoulder- from. No crepitus and no pain.      Assessment & Plan:  For hx of epigastric pain bland diet/gerd diet and can restart nexium  Will get cbc, cmp, and h pylori. Also get abd [place] today as well as cxr.  UA done as well.   I am writing famvir in event you get rash consistent with shingles.   Tramadol rx made available as well.  If any chest pain then ED.  Follow up in 7 days or as needed

## 2016-07-27 NOTE — Patient Instructions (Addendum)
For hx of epigastric pain bland diet/gerd diet and can restart nexium  Will get cbc, cmp, and h pylori. Also get abd [place] today as well as cxr.  UA done as well.   I am writing famvir in event you get rash consistent with shingles.   Tramadol rx made available as well.  If any chest pain then ED.  Follow up in 7 days or as needed

## 2016-07-30 ENCOUNTER — Encounter: Payer: Self-pay | Admitting: Medical

## 2016-07-30 ENCOUNTER — Telehealth: Payer: Self-pay | Admitting: Medical

## 2016-07-30 DIAGNOSIS — N200 Calculus of kidney: Secondary | ICD-10-CM

## 2016-07-30 LAB — H. PYLORI BREATH TEST: H. pylori Breath Test: NOT DETECTED

## 2016-07-30 NOTE — Telephone Encounter (Signed)
Referal to urologist placed. 

## 2016-08-01 LAB — HM DEXA SCAN: HM Dexa Scan: NORMAL

## 2016-08-07 LAB — HM DEXA SCAN

## 2016-09-27 ENCOUNTER — Ambulatory Visit: Payer: BLUE CROSS/BLUE SHIELD | Admitting: Internal Medicine

## 2016-09-27 ENCOUNTER — Encounter: Payer: Self-pay | Admitting: Internal Medicine

## 2016-09-27 ENCOUNTER — Ambulatory Visit (INDEPENDENT_AMBULATORY_CARE_PROVIDER_SITE_OTHER): Payer: BLUE CROSS/BLUE SHIELD | Admitting: Internal Medicine

## 2016-09-27 VITALS — BP 118/74 | HR 79 | Temp 98.2°F | Resp 16 | Ht 59.0 in | Wt 173.0 lb

## 2016-09-27 DIAGNOSIS — N200 Calculus of kidney: Secondary | ICD-10-CM

## 2016-09-27 DIAGNOSIS — E669 Obesity, unspecified: Secondary | ICD-10-CM | POA: Diagnosis not present

## 2016-09-27 DIAGNOSIS — K760 Fatty (change of) liver, not elsewhere classified: Secondary | ICD-10-CM

## 2016-09-27 DIAGNOSIS — E118 Type 2 diabetes mellitus with unspecified complications: Secondary | ICD-10-CM

## 2016-09-27 LAB — POCT GLYCOSYLATED HEMOGLOBIN (HGB A1C): Hemoglobin A1C: 6.7

## 2016-09-27 MED ORDER — DAPAGLIFLOZIN PROPANEDIOL 5 MG PO TABS: 5.0000 mg | ORAL_TABLET | Freq: Every day | ORAL | 3 refills | Status: DC

## 2016-09-27 MED ORDER — FREESTYLE LIBRE READER DEVI: 1.0000 | Freq: Two times a day (BID) | 1 refills | Status: DC

## 2016-09-27 MED ORDER — FREESTYLE LIBRE SENSOR SYSTEM MISC: 1.0000 | Freq: Two times a day (BID) | 1 refills | Status: DC

## 2016-09-27 MED ORDER — METFORMIN HCL 500 MG PO TABS: 500.0000 mg | ORAL_TABLET | Freq: Two times a day (BID) | ORAL | 1 refills | Status: DC

## 2016-09-27 NOTE — Progress Notes (Signed)
Subjective:  Patient ID: Angel Gordon, female    DOB: [birth year]  Age: 61 y.o. MRN: [id number]  CC: Diabetes   HPI Angel Gordon presents for f/up - she wants to get better control of her blood sugars and to lose weight. She had an episode of kidney stones about a month ago but tells me all those symptoms have resolved. She feels well today and offers no complaints.  Outpatient Medications Prior to Visit  Medication Sig Dispense Refill  . esomeprazole (NEXIUM) 40 MG capsule Take 1 capsule (40 mg total) by mouth daily at 12 noon. 90 capsule 3  . famciclovir (FAMVIR) 500 MG tablet Take 1 tablet (500 mg total) by mouth 2 (two) times daily. 14 tablet 0  . glucose blood (COOL BLOOD GLUCOSE TEST STRIPS) test strip Use to test blood sugar twice per day. DXE11.9 200 each 3  . Lancets MISC Use to test blood sugar. DX E11.9 200 each 3  . traMADol (ULTRAM) 50 MG tablet Take 1 tablet (50 mg total) by mouth every 6 (six) hours as needed. 12 tablet 0  . triamcinolone (NASACORT AQ) 55 MCG/ACT AERO nasal inhaler Place 2 sprays into the nose daily. 32.4 mL 3  . metFORMIN (GLUCOPHAGE) 500 MG tablet TAKE 1 TABLET TWICE A DAY 180 tablet 0   No facility-administered medications prior to visit.     ROS Review of Systems  Constitutional: Negative.  Negative for chills, diaphoresis, fatigue and fever.  HENT: Negative.   Eyes: Negative for visual disturbance.  Respiratory: Negative.  Negative for cough, chest tightness, shortness of breath and wheezing.   Cardiovascular: Negative for chest pain, palpitations and leg swelling.  Gastrointestinal: Negative.  Negative for abdominal pain, constipation, diarrhea, nausea and vomiting.  Genitourinary: Negative for decreased urine volume, difficulty urinating, dysuria, flank pain, hematuria and urgency.  Musculoskeletal: Negative for back pain and myalgias.  Skin: Negative.   Allergic/Immunologic: Negative.   Neurological: Negative.  Negative for  dizziness, weakness and numbness.  Hematological: Negative for adenopathy. Does not bruise/bleed easily.  Psychiatric/Behavioral: Negative.     Objective:  BP 118/74 (BP Location: Left Arm, Patient Position: Sitting, Cuff Size: Normal)   Pulse 79   Temp 98.2 F (36.8 C) (Oral)   Resp 16   Ht 4\' 11"  (1.499 m)   Wt 173 lb (78.5 kg)   SpO2 98%   BMI 34.94 kg/m   BP Readings from Last 3 Encounters:  09/27/16 118/74  07/27/16 135/72  07/10/16 118/82    Wt Readings from Last 3 Encounters:  09/27/16 173 lb (78.5 kg)  07/27/16 176 lb 6.4 oz (80 kg)  07/10/16 175 lb (79.4 kg)    Physical Exam  Constitutional: She is oriented to person, place, and time. No distress.  HENT:  Mouth/Throat: Oropharynx is clear and moist. No oropharyngeal exudate.  Eyes: Conjunctivae are normal. Right eye exhibits no discharge. Left eye exhibits no discharge. No scleral icterus.  Neck: Normal range of motion. Neck supple. No JVD present. No thyromegaly present.  Cardiovascular: Normal rate, regular rhythm and intact distal pulses.  Exam reveals no gallop and no friction rub.   No murmur heard. Pulmonary/Chest: Effort normal and breath sounds normal. No respiratory distress. She has no wheezes. She has no rales. She exhibits no tenderness.  Abdominal: Soft. Bowel sounds are normal. She exhibits no distension and no mass. There is no tenderness. There is no rebound and no guarding.  Musculoskeletal: Normal range of motion. She exhibits no edema,  tenderness or deformity.  Lymphadenopathy:    She has no cervical adenopathy.  Neurological: She is alert and oriented to person, place, and time.  Skin: Skin is warm and dry. No rash noted. She is not diaphoretic. No erythema. No pallor.  Vitals reviewed.   Lab Results  Component Value Date   WBC 5.2 07/27/2016   HGB 12.8 07/27/2016   HCT 39.6 07/27/2016   PLT 223.0 07/27/2016   GLUCOSE 106 (H) 07/27/2016   CHOL 123 07/10/2016   TRIG 60.0 07/10/2016    HDL 60.90 07/10/2016   LDLCALC 50 07/10/2016   ALT 52 (H) 07/27/2016   AST 48 (H) 07/27/2016   NA 141 07/27/2016   K 4.1 07/27/2016   CL 106 07/27/2016   CREATININE 1.02 07/27/2016   BUN 17 07/27/2016   CO2 25 07/27/2016   TSH 1.63 07/10/2016   HGBA1C 6.7 09/27/2016   MICROALBUR <0.7 07/10/2016    Dg Ribs Unilateral W/chest Right  Result Date: 07/27/2016 CLINICAL DATA:  Pain.  No known injury. EXAM: RIGHT RIBS AND CHEST - 3+ VIEW COMPARISON:  CT 09/29/2015, 06/30/2015. FINDINGS: No acute cardiopulmonary disease. No pleural effusion or pneumothorax. No evidence of displaced rib fracture pneumothorax. Degenerative changes and scoliosis thoracic spine . Right nephrolithiasis again noted. IMPRESSION: No acute focal abnormality identified. Electronically Signed   By: Marcello Moores  Register   On: 07/27/2016 10:04   US Abdomen Complete  Result Date: 07/27/2016 CLINICAL DATA:  Abdominal pain. EXAM: ABDOMEN ULTRASOUND COMPLETE COMPARISON:  CT 06/30/2015 . FINDINGS: Gallbladder: No gallstones or wall thickening visualized. No sonographic Murphy sign noted by sonographer. Common bile duct: Diameter: 4 mm Liver: Increased echogenicity consistent fatty infiltration and/or hepatocellular disease. Areas of decreased echogenicity most likely focal fatty sparing. IVC: No abnormality visualized. Pancreas: Visualized portion unremarkable. Spleen: Size and appearance within normal limits. Right Kidney: Length: 8.7 cm. Echogenicity within normal limits. No mass or hydronephrosis visualized. 1.2 cm nonobstructing stone. Left Kidney: Length: 9.5 cm. Echogenicity within normal limits. No mass or hydronephrosis visualized. 0.3 cm nonobstructing stone . Abdominal aorta: No aneurysm visualized. Other findings: None. IMPRESSION: 1.  Bilateral nonobstructing renal stones. 2. Increased hepatic echogenicity consistent with fatty infiltration and/or hepatocellular disease. Areas of decreased echogenicity most likely secondary to focal  fatty sparing . No gallstones or biliary distention. Electronically Signed   By: Marcello Moores  Register   On: 07/27/2016 09:59    Assessment & Plan:   Angel Gordon was seen today for diabetes.  Diagnoses and all orders for this visit:  Fatty liver disease, nonalcoholic- she is working on her lifestyle modifications to treat this.  Kidney stones- her stones are small and uncomplicated and are not causing any symptoms. She will continue to stay hydrated and to let me know she has any recurrences of flank pain.  Type 2 diabetes mellitus with complication, without long-term current use of insulin ([place])- her A1c is 6.7%. Will add an SGLT-2 inhibitor to the metformin for better blood sugar control. -     Continuous Blood Gluc Receiver (FREESTYLE LIBRE READER) DEVI; 1 Act by Does not apply route 2 (two) times daily. -     Continuous Blood Gluc Sensor ([place]) MISC; 1 Act by Does not apply route 2 (two) times daily. -     POCT HgB A1C -     dapagliflozin propanediol (FARXIGA) 5 MG TABS tablet; Take 5 mg by mouth daily. -     metFORMIN (GLUCOPHAGE) 500 MG tablet; Take 1 tablet (500 mg  total) by mouth 2 (two) times daily.  Obesity (BMI 30-39.9)- she is working on her lifestyle modifications to lose weight.   I have changed Angel Gordon's metFORMIN. I am also having her start on FREESTYLE LIBRE READER, [place], and dapagliflozin propanediol. Additionally, I am having her maintain her glucose blood, Lancets, esomeprazole, triamcinolone, traMADol, and famciclovir.  Meds ordered this encounter  Medications  . Continuous Blood Gluc Receiver (FREESTYLE LIBRE READER) DEVI    Sig: 1 Act by Does not apply route 2 (two) times daily.    Dispense:  1 Device    Refill:  1  . Continuous Blood Gluc Sensor (FREESTYLE LIBRE SENSOR SYSTEM) MISC    Sig: 1 Act by Does not apply route 2 (two) times daily.    Dispense:  1 each    Refill:  1  . dapagliflozin propanediol (FARXIGA) 5  MG TABS tablet    Sig: Take 5 mg by mouth daily.    Dispense:  30 tablet    Refill:  3  . metFORMIN (GLUCOPHAGE) 500 MG tablet    Sig: Take 1 tablet (500 mg total) by mouth 2 (two) times daily.    Dispense:  180 tablet    Refill:  1     Follow-up: Return in about 4 months (around 01/27/2017).  Scarlette Calico, MD

## 2016-09-27 NOTE — Patient Instructions (Signed)

## 2016-10-09 ENCOUNTER — Encounter: Payer: Self-pay | Admitting: Internal Medicine

## 2016-12-06 ENCOUNTER — Encounter: Payer: Self-pay | Admitting: Internal Medicine

## 2016-12-06 ENCOUNTER — Other Ambulatory Visit: Payer: Self-pay | Admitting: Internal Medicine

## 2016-12-06 DIAGNOSIS — E118 Type 2 diabetes mellitus with unspecified complications: Secondary | ICD-10-CM

## 2016-12-06 MED ORDER — FREESTYLE LIBRE SENSOR SYSTEM MISC: 1 refills | Status: DC

## 2016-12-17 ENCOUNTER — Other Ambulatory Visit: Payer: Self-pay | Admitting: Internal Medicine

## 2016-12-17 DIAGNOSIS — E118 Type 2 diabetes mellitus with unspecified complications: Secondary | ICD-10-CM

## 2016-12-17 MED ORDER — DAPAGLIFLOZIN PROPANEDIOL 5 MG PO TABS: 5.0000 mg | ORAL_TABLET | Freq: Every day | ORAL | 1 refills | Status: DC

## 2016-12-21 ENCOUNTER — Other Ambulatory Visit: Payer: Self-pay | Admitting: Internal Medicine

## 2016-12-21 DIAGNOSIS — E118 Type 2 diabetes mellitus with unspecified complications: Secondary | ICD-10-CM

## 2016-12-21 MED ORDER — EMPAGLIFLOZIN 10 MG PO TABS: 10.0000 mg | ORAL_TABLET | Freq: Every day | ORAL | 1 refills | Status: DC

## 2017-01-02 ENCOUNTER — Other Ambulatory Visit: Payer: Self-pay | Admitting: Internal Medicine

## 2017-01-02 ENCOUNTER — Telehealth: Payer: Self-pay | Admitting: Internal Medicine

## 2017-01-02 DIAGNOSIS — E118 Type 2 diabetes mellitus with unspecified complications: Secondary | ICD-10-CM

## 2017-01-02 MED ORDER — DAPAGLIFLOZIN PROPANEDIOL 5 MG PO TABS: 5.0000 mg | ORAL_TABLET | Freq: Every day | ORAL | 1 refills | Status: DC

## 2017-01-02 NOTE — Telephone Encounter (Signed)
Patient has been informed.

## 2017-01-02 NOTE — Telephone Encounter (Signed)
Pt rq Jardiance to be changed to [place] due to insurance.

## 2017-01-02 NOTE — Telephone Encounter (Signed)
Copied from [place] #[id number]. Topic: Inquiry >> Jan 02, 2017 11:23 AM Cecelia Byars, NT wrote: Reason for VEL:[id number]  [facility] ,called in regards to jardiance , it is not covered by insurance wants to know if this can be changed to [place] please advise [phone] ref # [id number] fax [phone]

## 2017-01-02 NOTE — Telephone Encounter (Signed)
Can you advise pt.

## 2017-01-02 NOTE — Telephone Encounter (Signed)
RX sent

## 2017-01-23 DIAGNOSIS — E1165 Type 2 diabetes mellitus with hyperglycemia: Secondary | ICD-10-CM | POA: Diagnosis not present

## 2017-01-23 LAB — HM DIABETES EYE EXAM

## 2017-02-11 ENCOUNTER — Other Ambulatory Visit (INDEPENDENT_AMBULATORY_CARE_PROVIDER_SITE_OTHER): Payer: BLUE CROSS/BLUE SHIELD

## 2017-02-11 ENCOUNTER — Ambulatory Visit: Payer: BLUE CROSS/BLUE SHIELD | Admitting: Internal Medicine

## 2017-02-11 ENCOUNTER — Encounter: Payer: Self-pay | Admitting: Internal Medicine

## 2017-02-11 VITALS — BP 112/78 | HR 71 | Temp 97.6°F | Ht 59.0 in | Wt 168.0 lb

## 2017-02-11 DIAGNOSIS — E118 Type 2 diabetes mellitus with unspecified complications: Secondary | ICD-10-CM | POA: Diagnosis not present

## 2017-02-11 DIAGNOSIS — R3129 Other microscopic hematuria: Secondary | ICD-10-CM | POA: Diagnosis not present

## 2017-02-11 LAB — URINALYSIS, ROUTINE W REFLEX MICROSCOPIC
Bilirubin Urine: NEGATIVE
Leukocytes, UA: NEGATIVE
Nitrite: NEGATIVE
Specific Gravity, Urine: 1.025 (ref 1.000–1.030)
Total Protein, Urine: NEGATIVE
Urine Glucose: >=1000 — AB
Urobilinogen, UA: 0.2 (ref 0.0–1.0)
pH: 5.5 (ref 5.0–8.0)

## 2017-02-11 LAB — BASIC METABOLIC PANEL
BUN: 19 mg/dL (ref 6–23)
CO2: 28 mEq/L (ref 19–32)
Calcium: 10.2 mg/dL (ref 8.4–10.5)
Chloride: 104 mEq/L (ref 96–112)
Creatinine, Ser: 1.12 mg/dL (ref 0.40–1.20)
GFR: 64 mL/min (ref >60.00)
Glucose, Bld: 92 mg/dL (ref 70–99)
Potassium: 4.9 mEq/L (ref 3.5–5.1)
Sodium: 139 mEq/L (ref 135–145)

## 2017-02-11 LAB — HEMOGLOBIN A1C: Hgb A1c MFr Bld: 7 % — ABNORMAL HIGH (ref 4.6–6.5)

## 2017-02-11 NOTE — Patient Instructions (Signed)

## 2017-02-11 NOTE — Progress Notes (Signed)
Subjective:  Patient ID: Angel Gordon, female    DOB: 02/02/1955  Age: 62 y.o. MRN: [id number]  CC: Diabetes   HPI Angel Gordon presents for f/up on DM 2.  She feels well and offers no complaints.  She has been achieving good blood sugar control and is asymptomatic.  She is working on her lifestyle modifications as well.  Outpatient Medications Prior to Visit  Medication Sig Dispense Refill  . Continuous Blood Gluc Receiver (FREESTYLE LIBRE READER) DEVI 1 Act by Does not apply route 2 (two) times daily. 1 Device 1  . Continuous Blood Gluc Sensor (FREESTYLE LIBRE SENSOR SYSTEM) MISC USE AS DIRECTED (CHANGE SENSOR EVERY 10 DAYS) 3 each 1  . dapagliflozin propanediol (FARXIGA) 5 MG TABS tablet Take 5 mg by mouth daily. 90 tablet 1  . esomeprazole (NEXIUM) 40 MG capsule Take 1 capsule (40 mg total) by mouth daily at 12 noon. 90 capsule 3  . famciclovir (FAMVIR) 500 MG tablet Take 1 tablet (500 mg total) by mouth 2 (two) times daily. 14 tablet 0  . metFORMIN (GLUCOPHAGE) 500 MG tablet Take 1 tablet (500 mg total) by mouth 2 (two) times daily. 180 tablet 1  . traMADol (ULTRAM) 50 MG tablet Take 1 tablet (50 mg total) by mouth every 6 (six) hours as needed. 12 tablet 0  . triamcinolone (NASACORT AQ) 55 MCG/ACT AERO nasal inhaler Place 2 sprays into the nose daily. 32.4 mL 3  . glucose blood (COOL BLOOD GLUCOSE TEST STRIPS) test strip Use to test blood sugar twice per day. DXE11.9 200 each 3  . Lancets MISC Use to test blood sugar. DX E11.9 200 each 3   No facility-administered medications prior to visit.     ROS Review of Systems  Constitutional: Negative for diaphoresis, fatigue and unexpected weight change.  HENT: Negative.   Eyes: Negative for visual disturbance.  Respiratory: Negative for cough, chest tightness, shortness of breath and wheezing.   Cardiovascular: Negative for chest pain, palpitations and leg swelling.  Gastrointestinal: Negative for abdominal pain, diarrhea,  nausea and vomiting.  Endocrine: Negative for polydipsia, polyphagia and polyuria.  Genitourinary: Negative.  Negative for difficulty urinating, dysuria, flank pain, frequency, hematuria and urgency.  Musculoskeletal: Negative.  Negative for back pain and myalgias.  Skin: Negative.   Allergic/Immunologic: Negative.   Neurological: Negative.  Negative for dizziness and light-headedness.  Hematological: Negative for adenopathy. Does not bruise/bleed easily.  Psychiatric/Behavioral: Negative.     Objective:  BP 112/78   Pulse 71   Temp 97.6 F (36.4 C) (Oral)   Ht 4\' 11"  (1.499 m)   Wt 168 lb (76.2 kg)   SpO2 99%   BMI 33.93 kg/m   BP Readings from Last 3 Encounters:  02/11/17 112/78  09/27/16 118/74  07/27/16 135/72    Wt Readings from Last 3 Encounters:  02/11/17 168 lb (76.2 kg)  09/27/16 173 lb (78.5 kg)  07/27/16 176 lb 6.4 oz (80 kg)    Physical Exam  Constitutional: She is oriented to person, place, and time. No distress.  HENT:  Mouth/Throat: Oropharynx is clear and moist. No oropharyngeal exudate.  Eyes: Conjunctivae are normal. Left eye exhibits no discharge. No scleral icterus.  Neck: Normal range of motion. Neck supple. No JVD present. No thyromegaly present.  Cardiovascular: Normal rate, regular rhythm and normal heart sounds. Exam reveals no gallop.  No murmur heard. Pulmonary/Chest: Effort normal and breath sounds normal. No respiratory distress. She has no wheezes. She has no rales.  Abdominal: Soft. Bowel sounds are normal. She exhibits no mass. There is no tenderness.  Musculoskeletal: Normal range of motion. She exhibits no edema, tenderness or deformity.  Lymphadenopathy:    She has no cervical adenopathy.  Neurological: She is alert and oriented to person, place, and time.  Skin: Skin is warm and dry. No rash noted. She is not diaphoretic. No erythema. No pallor.  Vitals reviewed.   Lab Results  Component Value Date   WBC 5.2 07/27/2016   HGB  12.8 07/27/2016   HCT 39.6 07/27/2016   PLT 223.0 07/27/2016   GLUCOSE 92 02/11/2017   CHOL 123 07/10/2016   TRIG 60.0 07/10/2016   HDL 60.90 07/10/2016   LDLCALC 50 07/10/2016   ALT 52 (H) 07/27/2016   AST 48 (H) 07/27/2016   NA 139 02/11/2017   K 4.9 02/11/2017   CL 104 02/11/2017   CREATININE 1.12 02/11/2017   BUN 19 02/11/2017   CO2 28 02/11/2017   TSH 1.63 07/10/2016   HGBA1C 7.0 (H) 02/11/2017   MICROALBUR <0.7 07/10/2016    Dg Ribs Unilateral W/chest Right  Result Date: 07/27/2016 CLINICAL DATA:  Pain.  No known injury. EXAM: RIGHT RIBS AND CHEST - 3+ VIEW COMPARISON:  CT 09/29/2015, 06/30/2015. FINDINGS: No acute cardiopulmonary disease. No pleural effusion or pneumothorax. No evidence of displaced rib fracture pneumothorax. Degenerative changes and scoliosis thoracic spine . Right nephrolithiasis again noted. IMPRESSION: No acute focal abnormality identified. Electronically Signed   By: Marcello Moores  Register   On: 07/27/2016 10:04   US Abdomen Complete  Result Date: 07/27/2016 CLINICAL DATA:  Abdominal pain. EXAM: ABDOMEN ULTRASOUND COMPLETE COMPARISON:  CT 06/30/2015 . FINDINGS: Gallbladder: No gallstones or wall thickening visualized. No sonographic Murphy sign noted by sonographer. Common bile duct: Diameter: 4 mm Liver: Increased echogenicity consistent fatty infiltration and/or hepatocellular disease. Areas of decreased echogenicity most likely focal fatty sparing. IVC: No abnormality visualized. Pancreas: Visualized portion unremarkable. Spleen: Size and appearance within normal limits. Right Kidney: Length: 8.7 cm. Echogenicity within normal limits. No mass or hydronephrosis visualized. 1.2 cm nonobstructing stone. Left Kidney: Length: 9.5 cm. Echogenicity within normal limits. No mass or hydronephrosis visualized. 0.3 cm nonobstructing stone . Abdominal aorta: No aneurysm visualized. Other findings: None. IMPRESSION: 1.  Bilateral nonobstructing renal stones. 2. Increased hepatic  echogenicity consistent with fatty infiltration and/or hepatocellular disease. Areas of decreased echogenicity most likely secondary to focal fatty sparing . No gallstones or biliary distention. Electronically Signed   By: Marcello Moores  Register   On: 07/27/2016 09:59    Assessment & Plan:   Angel Gordon was seen today for diabetes.  Diagnoses and all orders for this visit:  Type 2 diabetes mellitus with complication, without long-term current use of insulin ([place])- Her A1c is at 7.0%.  Her blood sugars are adequately well controlled. -     Basic metabolic panel; Future -     Hemoglobin A1c; Future  Hematuria, microscopic- She has chronic, painless hematuria.  This is a benign condition for her. -     Urinalysis, Routine w reflex microscopic; Future   I have discontinued Anderson [place] E. Bergsma's glucose blood and Lancets. I am also having her maintain her esomeprazole, triamcinolone, traMADol, famciclovir, FREESTYLE LIBRE READER, metFORMIN, FREESTYLE [place], and dapagliflozin propanediol.  No orders of the defined types were placed in this encounter.    Follow-up: Return in about 6 months (around 08/11/2017).  Scarlette Calico, MD

## 2017-02-12 ENCOUNTER — Encounter: Payer: Self-pay | Admitting: Internal Medicine

## 2017-02-20 ENCOUNTER — Other Ambulatory Visit: Payer: Self-pay | Admitting: Internal Medicine

## 2017-02-20 DIAGNOSIS — E118 Type 2 diabetes mellitus with unspecified complications: Secondary | ICD-10-CM

## 2017-03-13 ENCOUNTER — Other Ambulatory Visit: Payer: Self-pay | Admitting: Internal Medicine

## 2017-03-13 DIAGNOSIS — E118 Type 2 diabetes mellitus with unspecified complications: Secondary | ICD-10-CM

## 2017-03-20 ENCOUNTER — Ambulatory Visit: Payer: BLUE CROSS/BLUE SHIELD | Admitting: Podiatry

## 2017-03-28 ENCOUNTER — Other Ambulatory Visit: Payer: Self-pay | Admitting: Internal Medicine

## 2017-03-28 DIAGNOSIS — E118 Type 2 diabetes mellitus with unspecified complications: Secondary | ICD-10-CM

## 2017-04-22 ENCOUNTER — Encounter: Payer: Self-pay | Admitting: Internal Medicine

## 2017-04-22 DIAGNOSIS — E118 Type 2 diabetes mellitus with unspecified complications: Secondary | ICD-10-CM

## 2017-04-22 MED ORDER — FREESTYLE LIBRE SENSOR SYSTEM MISC: 1 refills | Status: DC

## 2017-06-04 ENCOUNTER — Other Ambulatory Visit: Payer: Self-pay | Admitting: Internal Medicine

## 2017-06-04 DIAGNOSIS — E118 Type 2 diabetes mellitus with unspecified complications: Secondary | ICD-10-CM

## 2017-06-06 LAB — HM PAP SMEAR

## 2017-06-13 DIAGNOSIS — Z6833 Body mass index (BMI) 33.0-33.9, adult: Secondary | ICD-10-CM | POA: Diagnosis not present

## 2017-06-13 DIAGNOSIS — Z01419 Encounter for gynecological examination (general) (routine) without abnormal findings: Secondary | ICD-10-CM | POA: Diagnosis not present

## 2017-06-14 LAB — HM PAP SMEAR: HM Pap smear: NEGATIVE

## 2017-07-16 ENCOUNTER — Encounter: Payer: Self-pay | Admitting: Internal Medicine

## 2017-07-16 ENCOUNTER — Ambulatory Visit (INDEPENDENT_AMBULATORY_CARE_PROVIDER_SITE_OTHER): Payer: BLUE CROSS/BLUE SHIELD | Admitting: Internal Medicine

## 2017-07-16 ENCOUNTER — Other Ambulatory Visit (INDEPENDENT_AMBULATORY_CARE_PROVIDER_SITE_OTHER): Payer: BLUE CROSS/BLUE SHIELD

## 2017-07-16 VITALS — BP 118/82 | HR 83 | Temp 98.0°F | Resp 16 | Ht 59.0 in | Wt 161.0 lb

## 2017-07-16 DIAGNOSIS — D892 Hypergammaglobulinemia, unspecified: Secondary | ICD-10-CM

## 2017-07-16 DIAGNOSIS — E118 Type 2 diabetes mellitus with unspecified complications: Secondary | ICD-10-CM | POA: Diagnosis not present

## 2017-07-16 DIAGNOSIS — K760 Fatty (change of) liver, not elsewhere classified: Secondary | ICD-10-CM

## 2017-07-16 DIAGNOSIS — R3129 Other microscopic hematuria: Secondary | ICD-10-CM

## 2017-07-16 DIAGNOSIS — Z0000 Encounter for general adult medical examination without abnormal findings: Secondary | ICD-10-CM | POA: Diagnosis not present

## 2017-07-16 LAB — COMPREHENSIVE METABOLIC PANEL
ALT: 24 U/L (ref 0–35)
AST: 23 U/L (ref 0–37)
Albumin: 4.1 g/dL (ref 3.5–5.2)
Alkaline Phosphatase: 69 U/L (ref 39–117)
BUN: 16 mg/dL (ref 6–23)
CO2: 28 mEq/L (ref 19–32)
Calcium: 10.2 mg/dL (ref 8.4–10.5)
Chloride: 103 mEq/L (ref 96–112)
Creatinine, Ser: 1 mg/dL (ref 0.40–1.20)
GFR: 72.83 mL/min (ref >60.00)
Glucose, Bld: 86 mg/dL (ref 70–99)
Potassium: 4.1 mEq/L (ref 3.5–5.1)
Sodium: 139 mEq/L (ref 135–145)
Total Bilirubin: 0.3 mg/dL (ref 0.2–1.2)
Total Protein: 8.6 g/dL — ABNORMAL HIGH (ref 6.0–8.3)

## 2017-07-16 LAB — LIPID PANEL
Cholesterol: 125 mg/dL (ref 0–200)
HDL: 63.2 mg/dL (ref >39.00)
LDL Cholesterol: 49 mg/dL (ref 0–99)
NonHDL: 61.89
Total CHOL/HDL Ratio: 2
Triglycerides: 62 mg/dL (ref 0.0–149.0)
VLDL: 12.4 mg/dL (ref 0.0–40.0)

## 2017-07-16 LAB — CBC WITH DIFFERENTIAL/PLATELET
Basophils Absolute: 0 10*3/uL (ref 0.0–0.1)
Basophils Relative: 0.5 % (ref 0.0–3.0)
Eosinophils Absolute: 0.2 10*3/uL (ref 0.0–0.7)
Eosinophils Relative: 3.2 % (ref 0.0–5.0)
HCT: 39.9 % (ref 36.0–46.0)
Hemoglobin: 12.9 g/dL (ref 12.0–15.0)
Lymphocytes Relative: 49.1 % — ABNORMAL HIGH (ref 12.0–46.0)
Lymphs Abs: 3.4 10*3/uL (ref 0.7–4.0)
MCHC: 32.3 g/dL (ref 30.0–36.0)
MCV: 87.1 fl (ref 78.0–100.0)
Monocytes Absolute: 0.6 10*3/uL (ref 0.1–1.0)
Monocytes Relative: 8 % (ref 3.0–12.0)
Neutro Abs: 2.7 10*3/uL (ref 1.4–7.7)
Neutrophils Relative %: 39.2 % — ABNORMAL LOW (ref 43.0–77.0)
Platelets: 234 10*3/uL (ref 150.0–400.0)
RBC: 4.58 Mil/uL (ref 3.87–5.11)
RDW: 14.5 % (ref 11.5–15.5)
WBC: 7 10*3/uL (ref 4.0–10.5)

## 2017-07-16 LAB — MICROALBUMIN / CREATININE URINE RATIO
Creatinine,U: 87.7 mg/dL
Microalb Creat Ratio: 0.8 mg/g (ref 0.0–30.0)
Microalb, Ur: 0.7 mg/dL (ref 0.0–1.9)

## 2017-07-16 LAB — URINALYSIS, ROUTINE W REFLEX MICROSCOPIC
Bilirubin Urine: NEGATIVE
Ketones, ur: NEGATIVE
Leukocytes, UA: NEGATIVE
Nitrite: NEGATIVE
Specific Gravity, Urine: 1.02 (ref 1.000–1.030)
Total Protein, Urine: NEGATIVE
Urine Glucose: >=1000 — AB
Urobilinogen, UA: 0.2 (ref 0.0–1.0)
pH: 5.5 (ref 5.0–8.0)

## 2017-07-16 LAB — HM DIABETES FOOT EXAM

## 2017-07-16 LAB — HEMOGLOBIN A1C: Hgb A1c MFr Bld: 6.7 % — ABNORMAL HIGH (ref 4.6–6.5)

## 2017-07-16 LAB — TSH: TSH: 1.68 u[IU]/mL (ref 0.35–4.50)

## 2017-07-16 NOTE — Patient Instructions (Signed)

## 2017-07-16 NOTE — Progress Notes (Signed)
Subjective:  Patient ID: Angel Gordon, female    DOB: 1955-10-27  Age: 62 y.o. MRN: [id number]  CC: Annual Exam and Diabetes   HPI Angel Gordon presents for a CPX.  She tells me that she is very active with an exercise program.  She walks about 4 times a week and denies any episodes of DOE, CP, shortness of breath, palpitations, edema, or fatigue.  She also tells me her blood sugars have been well controlled.  She is being followed for an elevated total protein and she denies weight loss, night sweats, sore throats, rash, lymphadenopathy, fever, or chills.  Outpatient Medications Prior to Visit  Medication Sig Dispense Refill  . Continuous Blood Gluc Receiver (FREESTYLE LIBRE READER) DEVI 1 Act by Does not apply route 2 (two) times daily. 1 Device 1  . Continuous Blood Gluc Sensor (FREESTYLE LIBRE SENSOR SYSTEM) MISC Use as directed 1 each 1  . dapagliflozin propanediol (FARXIGA) 5 MG TABS tablet Take 5 mg by mouth daily. 30 tablet 3  . esomeprazole (NEXIUM) 40 MG capsule Take 1 capsule (40 mg total) by mouth daily at 12 noon. 90 capsule 3  . metFORMIN (GLUCOPHAGE) 500 MG tablet TAKE 1 TABLET BY MOUTH TWICE A DAY 180 tablet 1  . triamcinolone (NASACORT AQ) 55 MCG/ACT AERO nasal inhaler Place 2 sprays into the nose daily. 32.4 mL 3  . dapagliflozin propanediol (FARXIGA) 5 MG TABS tablet Take 5 mg by mouth daily. 90 tablet 1  . famciclovir (FAMVIR) 500 MG tablet Take 1 tablet (500 mg total) by mouth 2 (two) times daily. 14 tablet 0  . traMADol (ULTRAM) 50 MG tablet Take 1 tablet (50 mg total) by mouth every 6 (six) hours as needed. 12 tablet 0   No facility-administered medications prior to visit.     ROS Review of Systems  Constitutional: Negative for chills, diaphoresis, fatigue, fever and unexpected weight change.  HENT: Negative.  Negative for sore throat, trouble swallowing and voice change.   Respiratory: Negative for cough, chest tightness, shortness of breath and  wheezing.   Cardiovascular: Negative for chest pain, palpitations and leg swelling.  Gastrointestinal: Negative for abdominal pain, constipation, diarrhea, nausea and vomiting.  Endocrine: Negative for polydipsia, polyphagia and polyuria.  Genitourinary: Negative.  Negative for difficulty urinating.  Musculoskeletal: Negative.  Negative for arthralgias, myalgias and neck pain.  Skin: Negative for color change, pallor and rash.  Neurological: Negative.  Negative for dizziness, weakness, light-headedness and headaches.  Hematological: Negative for adenopathy. Does not bruise/bleed easily.  Psychiatric/Behavioral: Negative.     Objective:  BP 118/82 (BP Location: Left Arm, Patient Position: Sitting, Cuff Size: Normal)   Pulse 83   Temp 98 F (36.7 C) (Oral)   Resp 16   Ht 4\' 11"  (1.499 m)   Wt 161 lb (73 kg)   SpO2 97%   BMI 32.52 kg/m   BP Readings from Last 3 Encounters:  07/16/17 118/82  02/11/17 112/78  09/27/16 118/74    Wt Readings from Last 3 Encounters:  07/16/17 161 lb (73 kg)  02/11/17 168 lb (76.2 kg)  09/27/16 173 lb (78.5 kg)    Physical Exam  Constitutional: She is oriented to person, place, and time. No distress.  HENT:  Mouth/Throat: Oropharynx is clear and moist. No oropharyngeal exudate.  Eyes: Conjunctivae are normal. No scleral icterus.  Neck: Normal range of motion. Neck supple. No JVD present. No thyromegaly present.  Cardiovascular: Normal rate, regular rhythm and normal heart sounds.  No murmur heard. Pulmonary/Chest: Effort normal and breath sounds normal. She has no wheezes. She has no rales.  Abdominal: Soft. Normal appearance and bowel sounds are normal. She exhibits no mass. There is no hepatosplenomegaly. There is no tenderness. No hernia.  Musculoskeletal: Normal range of motion. She exhibits no edema, tenderness or deformity.  Lymphadenopathy:    She has no cervical adenopathy.  Neurological: She is alert and oriented to person, place, and  time.  Skin: Skin is warm and dry. She is not diaphoretic. No pallor.  Vitals reviewed.   Lab Results  Component Value Date   WBC 7.0 07/16/2017   HGB 12.9 07/16/2017   HCT 39.9 07/16/2017   PLT 234.0 07/16/2017   GLUCOSE 86 07/16/2017   CHOL 125 07/16/2017   TRIG 62.0 07/16/2017   HDL 63.20 07/16/2017   LDLCALC 49 07/16/2017   ALT 24 07/16/2017   AST 23 07/16/2017   NA 139 07/16/2017   K 4.1 07/16/2017   CL 103 07/16/2017   CREATININE 1.00 07/16/2017   BUN 16 07/16/2017   CO2 28 07/16/2017   TSH 1.68 07/16/2017   HGBA1C 6.7 (H) 07/16/2017   MICROALBUR 0.7 07/16/2017    Dg Ribs Unilateral W/chest Right  Result Date: 07/27/2016 CLINICAL DATA:  Pain.  No known injury. EXAM: RIGHT RIBS AND CHEST - 3+ VIEW COMPARISON:  CT 09/29/2015, 06/30/2015. FINDINGS: No acute cardiopulmonary disease. No pleural effusion or pneumothorax. No evidence of displaced rib fracture pneumothorax. Degenerative changes and scoliosis thoracic spine . Right nephrolithiasis again noted. IMPRESSION: No acute focal abnormality identified. Electronically Signed   By: Marcello Moores  Register   On: 07/27/2016 10:04   US Abdomen Complete  Result Date: 07/27/2016 CLINICAL DATA:  Abdominal pain. EXAM: ABDOMEN ULTRASOUND COMPLETE COMPARISON:  CT 06/30/2015 . FINDINGS: Gallbladder: No gallstones or wall thickening visualized. No sonographic Murphy sign noted by sonographer. Common bile duct: Diameter: 4 mm Liver: Increased echogenicity consistent fatty infiltration and/or hepatocellular disease. Areas of decreased echogenicity most likely focal fatty sparing. IVC: No abnormality visualized. Pancreas: Visualized portion unremarkable. Spleen: Size and appearance within normal limits. Right Kidney: Length: 8.7 cm. Echogenicity within normal limits. No mass or hydronephrosis visualized. 1.2 cm nonobstructing stone. Left Kidney: Length: 9.5 cm. Echogenicity within normal limits. No mass or hydronephrosis visualized. 0.3 cm  nonobstructing stone . Abdominal aorta: No aneurysm visualized. Other findings: None. IMPRESSION: 1.  Bilateral nonobstructing renal stones. 2. Increased hepatic echogenicity consistent with fatty infiltration and/or hepatocellular disease. Areas of decreased echogenicity most likely secondary to focal fatty sparing . No gallstones or biliary distention. Electronically Signed   By: Marcello Moores  Register   On: 07/27/2016 09:59    Assessment & Plan:   Angel Gordon was seen today for annual exam and diabetes.  Diagnoses and all orders for this visit:  Fatty liver disease, nonalcoholic- Her LFTs are normal now.  She was praised for her lifestyle modifications. -     Comprehensive metabolic panel; Future  Type 2 diabetes mellitus with complication, without long-term current use of insulin ([place])- Her A1c is at 6.7%.  Her blood sugars are adequately well controlled.  Will continue the current combination of metformin and an SGLT2 inhibitor. -     Microalbumin / creatinine urine ratio; Future -     TSH; Future -     Hemoglobin A1c; Future -     Comprehensive metabolic panel; Future  Paraproteinemia- Her total protein remains mildly elevated.  She has no systemic symptoms suspicious for lymphoproliferative disease.  Her  CBC is normal.  Her SPEP is pending.  If the SPEP is abnormal then I may consider referring her to hematology but for now will just watch and wait regarding this. -     CBC with Differential/Platelet; Future -     Protein electrophoresis, serum; Future -     Comprehensive metabolic panel; Future  Routine health maintenance- Exam completed, labs reviewed, vaccines reviewed, screening for colon cancer and breast cancer up-to-date, Pap smear is up-to-date, patient education material was given. -     Lipid panel; Future  Hematuria, microscopic- She continues to have a few red blood cells in her urine.  A renal ultrasound a year ago showed no mass lesions but a few nonobstructing stones.  This is  an asymptomatic and a benign condition for her. -     Urinalysis, Routine w reflex microscopic; Future   I have discontinued [facility] E. Bergman's traMADol and famciclovir. I am also having her maintain her esomeprazole, triamcinolone, FREESTYLE LIBRE READER, metFORMIN, FREESTYLE [place], and dapagliflozin propanediol.  No orders of the defined types were placed in this encounter.    Follow-up: Return in about 6 months (around 01/15/2018).  Scarlette Calico, MD

## 2017-07-17 ENCOUNTER — Encounter: Payer: Self-pay | Admitting: Internal Medicine

## 2017-07-17 MED ORDER — DAPAGLIFLOZIN PROPANEDIOL 5 MG PO TABS: 5.0000 mg | ORAL_TABLET | Freq: Every day | ORAL | 1 refills | Status: DC

## 2017-07-17 MED ORDER — METFORMIN HCL 500 MG PO TABS: 500.0000 mg | ORAL_TABLET | Freq: Two times a day (BID) | ORAL | 1 refills | Status: DC

## 2017-07-18 ENCOUNTER — Encounter: Payer: Self-pay | Admitting: Internal Medicine

## 2017-07-18 LAB — PROTEIN ELECTROPHORESIS, SERUM
Albumin ELP: 3.6 g/dL — ABNORMAL LOW (ref 3.8–4.8)
Alpha 1: 0.3 g/dL (ref 0.2–0.3)
Alpha 2: 1 g/dL — ABNORMAL HIGH (ref 0.5–0.9)
Beta 2: 0.8 g/dL — ABNORMAL HIGH (ref 0.2–0.5)
Beta Globulin: 0.7 g/dL — ABNORMAL HIGH (ref 0.4–0.6)
Gamma Globulin: 1.9 g/dL — ABNORMAL HIGH (ref 0.8–1.7)
Total Protein: 8.2 g/dL — ABNORMAL HIGH (ref 6.1–8.1)

## 2017-07-22 ENCOUNTER — Other Ambulatory Visit: Payer: Self-pay | Admitting: Internal Medicine

## 2017-07-22 ENCOUNTER — Encounter: Payer: Self-pay | Admitting: Internal Medicine

## 2017-07-22 DIAGNOSIS — M5440 Lumbago with sciatica, unspecified side: Secondary | ICD-10-CM

## 2017-07-22 MED ORDER — IBUPROFEN 800 MG PO TABS: 800.0000 mg | ORAL_TABLET | Freq: Three times a day (TID) | ORAL | 0 refills | Status: DC | PRN

## 2017-07-26 ENCOUNTER — Encounter: Payer: Self-pay | Admitting: Internal Medicine

## 2017-07-26 MED ORDER — SULFAMETHOXAZOLE-TRIMETHOPRIM 800-160 MG PO TABS: 1.0000 | ORAL_TABLET | Freq: Two times a day (BID) | ORAL | 0 refills | Status: DC

## 2017-07-26 NOTE — Telephone Encounter (Signed)
I spoke to patient regarding her symptoms; based on symptoms and U/A appearance at OV last week, will go ahead and treat for a UTI; she understands and agrees to follow-up in 1 week to follow-up. She prefers to call back and schedule her appointment.

## 2017-07-27 ENCOUNTER — Ambulatory Visit: Payer: BLUE CROSS/BLUE SHIELD | Admitting: Family Medicine

## 2017-07-31 ENCOUNTER — Ambulatory Visit: Payer: BLUE CROSS/BLUE SHIELD | Admitting: Nurse Practitioner

## 2017-07-31 ENCOUNTER — Encounter: Payer: Self-pay | Admitting: Nurse Practitioner

## 2017-07-31 ENCOUNTER — Other Ambulatory Visit: Payer: BLUE CROSS/BLUE SHIELD

## 2017-07-31 VITALS — BP 118/74 | HR 76 | Temp 97.9°F | Resp 16 | Ht 59.0 in | Wt 163.0 lb

## 2017-07-31 DIAGNOSIS — M545 Low back pain, unspecified: Secondary | ICD-10-CM

## 2017-07-31 DIAGNOSIS — R319 Hematuria, unspecified: Secondary | ICD-10-CM | POA: Diagnosis not present

## 2017-07-31 DIAGNOSIS — R350 Frequency of micturition: Secondary | ICD-10-CM | POA: Diagnosis not present

## 2017-07-31 LAB — POCT URINALYSIS DIPSTICK
Bilirubin, UA: NEGATIVE
Glucose, UA: NEGATIVE
Ketones, UA: NEGATIVE
Leukocytes, UA: NEGATIVE
Nitrite, UA: NEGATIVE
Protein, UA: NEGATIVE
Spec Grav, UA: >=1.03 — AB (ref 1.010–1.025)
Urobilinogen, UA: NEGATIVE E.U./dL — AB
pH, UA: 6 (ref 5.0–8.0)

## 2017-07-31 NOTE — Progress Notes (Signed)
Name: Angel Gordon   MRN: [id number]    DOB: [birth year]   Date:07/31/2017       Progress Note  Subjective  Chief Complaint  Chief Complaint  Patient presents with  . Back Pain    low back pain, urinary frequency, starting last tuesday    HPI  Angel Gordon is here today for evaluation of acute complaint of urinary symptoms and lower back pain. Her symptoms began about a week ago on Tuesday, with dull aching pain in her lower back which she first noticed when bending forward to pick up her purse. She called our office and was sent a course of ibuprofen 800 for her pain last Tuesday which did not seem to help, then around last Friday began to notice some urinary frequency, vaginal itching and discharge. She called back to our office on Friday and was sent a course of bactrim which she has been taking as prescribed and will complete today. She says that since taking the bactrim her vaginal itching and discharge have improved but she has continued to experience lower back pain and urinary frequency. She denies fevers, chills, weakness, abdominal pain, nausea, vomiting, diarrhea, numbness, tingling, edema. She says she overall feels well, eating okay and sleeping okay. She has a history of bilateral kidney stones, but reports last time she had problems with her kidney stones her pain was much more severe than her pain now  Patient Active Problem List   Diagnosis Date Noted  . Acute low back pain with sciatica 07/22/2017  . Kidney stones 09/27/2016  . Fatty liver disease, nonalcoholic 46/65/9935  . Gastroesophageal reflux disease with esophagitis 10/28/2015  . Paraproteinemia 10/03/2015  . Hematuria, microscopic 06/29/2015  . Atopic dermatitis 05/14/2015  . Allergic rhinitis due to pollen 06/16/2014  . Obesity (BMI 30-39.9) 09/21/2010  . Routine health maintenance 09/21/2010  . Type II diabetes mellitus with manifestations ([place]) 04/03/2007    Social History   Tobacco Use  . Smoking  status: Never Smoker  . Smokeless tobacco: Never Used  Substance Use Topics  . Alcohol use: No     Current Outpatient Medications:  .  Continuous Blood Gluc Receiver (FREESTYLE LIBRE READER) DEVI, 1 Act by Does not apply route 2 (two) times daily., Disp: 1 Device, Rfl: 1 .  Continuous Blood Gluc Sensor (FREESTYLE LIBRE SENSOR SYSTEM) MISC, Use as directed, Disp: 1 each, Rfl: 1 .  dapagliflozin propanediol (FARXIGA) 5 MG TABS tablet, Take 5 mg by mouth daily., Disp: 90 tablet, Rfl: 1 .  esomeprazole (NEXIUM) 40 MG capsule, Take 1 capsule (40 mg total) by mouth daily at 12 noon., Disp: 90 capsule, Rfl: 3 .  ibuprofen (ADVIL,MOTRIN) 800 MG tablet, Take 1 tablet (800 mg total) by mouth every 8 (eight) hours as needed., Disp: 90 tablet, Rfl: 0 .  metFORMIN (GLUCOPHAGE) 500 MG tablet, Take 1 tablet (500 mg total) by mouth 2 (two) times daily., Disp: 180 tablet, Rfl: 1 .  sulfamethoxazole-trimethoprim (BACTRIM DS,SEPTRA DS) 800-160 MG tablet, Take 1 tablet by mouth 2 (two) times daily., Disp: 10 tablet, Rfl: 0 .  triamcinolone (NASACORT AQ) 55 MCG/ACT AERO nasal inhaler, Place 2 sprays into the nose daily., Disp: 32.4 mL, Rfl: 3  No Known Allergies  ROS  No other specific complaints in a complete review of systems (except as listed in HPI above).  Objective  Vitals:   07/31/17 1558  BP: 118/74  Pulse: 76  Resp: 16  Temp: 97.9 F (36.6 C)  TempSrc: Oral  SpO2: 98%  Weight: 163 lb (73.9 kg)  Height: 4\' 11"  (1.499 m)    Body mass index is 32.92 kg/m.  Nursing Note and Vital Signs reviewed.  Physical Exam  Constitutional: Patient appears well-developed and well-nourished.  No distress.  HEENT: head atraumatic, normocephalic, pupils equal and reactive to light, EOM's intact, neck supple without lymphadenopathy, oropharynx pink and moist without exudate Cardiovascular: Normal rate, regular rhythm, S1/S2 present.  No murmur or rub heard. No BLE edema.distal pulses  intact. Pulmonary/Chest: Effort normal and breath sounds clear. No respiratory distress or retractions. Abdominal: Soft and non-tender, bowel sounds present x4 quadrants.  No CVA Tenderness. Musculoskeletal: Normal range of motion, No gross deformities. Lumbar back without edema, tenderness. Neurological: She is alert and oriented to person, place, and time. No cranial nerve deficit. Coordination, balance, strength, speech and gait are normal.  Skin: Skin is warm and dry. No rash noted. No erythema.  Psychiatric: Patient has a normal mood and affect. behavior is normal. Judgment and thought content normal.   Assessment & Plan  1. Urinary frequency, Acute bilateral low back pain without sciatica, Hematuria, unspecified type - POCT urinalysis dipstick - CT RENAL STONE STUDY; Future - Urine Culture; Future -POCT urine + for blood today, will send urine culture and update imaging to rule out kidney stone or other cause of urinary frequency and lower back pain  -Red flags and when to present for emergency care or RTC including fever >101.70F,  new/worsening/un-resolving symptoms, reviewed with patient at time of visit. Follow up and care instructions discussed and provided in AVS. -F/U with further recommendations pending lab results

## 2017-07-31 NOTE — Patient Instructions (Addendum)
I have placed an order for a ct scan to check for kidney stones. Our office will begin processing this referral. Please follow up if you have not heard anything about this referral within 10 days.  We have also sent your urine for a culture.  I will contact you for further plans when I get the test results.   Urinary Frequency, Adult Urinary frequency means urinating more often than usual. People with urinary frequency urinate at least 8 times in 24 hours, even if they drink a normal amount of fluid. Although they urinate more often than normal, the total amount of urine produced in a day may be normal. Urinary frequency is also called pollakiuria. What are the causes? This condition may be caused by:  A urinary tract infection.  Obesity.  Bladder problems, such as bladder stones.  Caffeine or alcohol.  Eating food or drinking fluids that irritate the bladder. These include coffee, tea, soda, artificial sweeteners, citrus, tomato-based foods, and chocolate.  Certain medicines, such as medicines that help the body get rid of extra fluid (diuretics).  Muscle or nerve weakness.  Overactive bladder.  Chronic diabetes.  Interstitial cystitis.  In men, problems with the prostate, such as an enlarged prostate.  In women, pregnancy.  In some cases, the cause may not be known. What increases the risk? This condition is more likely to develop in:  Women who have gone through menopause.  Men with prostate problems.  People with a disease or injury that affects the nerves or spinal cord.  People who have or have had a condition that affects the brain, such as a stroke.  What are the signs or symptoms? Symptoms of this condition include:  Feeling an urgent need to urinate often. The stress and anxiety of needing to find a bathroom quickly can make this urge worse.  Urinating 8 or more times in 24 hours.  Urinating as often as every 1 to 2 hours.  How is this  diagnosed? This condition is diagnosed based on your symptoms, your medical history, and a physical exam. You may have tests, such as:  Blood tests.  Urine tests.  Imaging tests, such as X-rays or ultrasounds.  A bladder test.  A test of your neurological system. This is the body system that senses the need to urinate.  A test to check for problems in the urethra and bladder called cystoscopy.  You may also be asked to keep a bladder diary. A bladder diary is a record of what you eat and drink, how often you urinate, and how much you urinate. You may need to see a health care provider who specializes in conditions of the urinary tract (urologist) or kidneys (nephrologist). How is this treated? Treatment for this condition depends on the cause. Sometimes the condition goes away on its own and treatment is not necessary. If treatment is needed, it may include:  Taking medicine.  Learning exercises that strengthen the muscles that help control urination.  Following a bladder training program. This may include: ? Learning to delay going to the bathroom. ? Double urinating (voiding). This helps if you are not completely emptying your bladder. ? Scheduled voiding.  Making diet changes, such as: ? Avoiding caffeine. ? Drinking fewer fluids, especially alcohol. ? Not drinking in the evening. ? Not having foods or drinks that may irritate the bladder. ? Eating foods that help prevent or ease constipation. Constipation can make this condition worse.  Having the nerves in your bladder stimulated.  There are two options for stimulating the nerves to your bladder: ? Outpatient electrical nerve stimulation. This is done by your health care provider. ? Surgery to implant a bladder pacemaker. The pacemaker helps to control the urge to urinate.  Follow these instructions at home:  Keep a bladder diary if told to by your health care provider.  Take over-the-counter and prescription medicines  only as told by your health care provider.  Do any exercises as told by your health care provider.  Follow a bladder training program as told by your health care provider.  Make any recommended diet changes.  Keep all follow-up visits as told by your health care provider. This is important. Contact a health care provider if:  You start urinating more often.  You feel pain or irritation when you urinate.  You notice blood in your urine.  Your urine looks cloudy.  You develop a fever.  You begin vomiting. Get help right away if:  You are unable to urinate. This information is not intended to replace advice given to you by your health care provider. Make sure you discuss any questions you have with your health care provider. Document Released: 11/04/2008 Document Revised: 02/09/2015 Document Reviewed: 08/04/2014 Elsevier Interactive Patient Education  [employer].

## 2017-08-01 ENCOUNTER — Ambulatory Visit (INDEPENDENT_AMBULATORY_CARE_PROVIDER_SITE_OTHER)
Admission: RE | Admit: 2017-08-01 | Discharge: 2017-08-01 | Disposition: A | Discharge status: Home / Self Care | Payer: BLUE CROSS/BLUE SHIELD | Source: Ambulatory Visit | Attending: Nurse Practitioner | Admitting: Nurse Practitioner

## 2017-08-01 DIAGNOSIS — N200 Calculus of kidney: Secondary | ICD-10-CM | POA: Diagnosis not present

## 2017-08-01 DIAGNOSIS — R319 Hematuria, unspecified: Secondary | ICD-10-CM | POA: Diagnosis not present

## 2017-08-01 DIAGNOSIS — R350 Frequency of micturition: Secondary | ICD-10-CM

## 2017-08-01 DIAGNOSIS — M545 Low back pain, unspecified: Secondary | ICD-10-CM

## 2017-08-01 LAB — URINE CULTURE
MICRO NUMBER:: 90817225
Result:: NO GROWTH
SPECIMEN QUALITY:: ADEQUATE

## 2017-08-02 ENCOUNTER — Encounter: Payer: Self-pay | Admitting: Nurse Practitioner

## 2017-08-02 ENCOUNTER — Other Ambulatory Visit: Payer: Self-pay | Admitting: Family

## 2017-08-02 DIAGNOSIS — N200 Calculus of kidney: Secondary | ICD-10-CM

## 2017-08-02 NOTE — Telephone Encounter (Signed)
Please let her know that her CT did show a 10 mm right sided kidney stone but this is non-obstructing; no other acute findings; based on her symptoms though, can refer to urology for further evaluation.

## 2017-08-05 ENCOUNTER — Encounter: Payer: Self-pay | Admitting: Nurse Practitioner

## 2017-08-05 ENCOUNTER — Encounter: Payer: Self-pay | Admitting: Internal Medicine

## 2017-08-05 DIAGNOSIS — Z1231 Encounter for screening mammogram for malignant neoplasm of breast: Secondary | ICD-10-CM | POA: Diagnosis not present

## 2017-08-05 LAB — HM MAMMOGRAPHY

## 2017-08-08 ENCOUNTER — Ambulatory Visit: Payer: BLUE CROSS/BLUE SHIELD | Admitting: Medical

## 2017-08-13 ENCOUNTER — Other Ambulatory Visit: Payer: Self-pay | Admitting: Internal Medicine

## 2017-08-13 DIAGNOSIS — E118 Type 2 diabetes mellitus with unspecified complications: Secondary | ICD-10-CM

## 2017-08-16 ENCOUNTER — Other Ambulatory Visit: Payer: Self-pay | Admitting: Internal Medicine

## 2017-08-16 DIAGNOSIS — E118 Type 2 diabetes mellitus with unspecified complications: Secondary | ICD-10-CM

## 2017-08-19 ENCOUNTER — Other Ambulatory Visit: Payer: Self-pay | Admitting: Internal Medicine

## 2017-08-19 DIAGNOSIS — M5440 Lumbago with sciatica, unspecified side: Secondary | ICD-10-CM

## 2017-08-19 LAB — HM MAMMOGRAPHY: HM Mammogram: NORMAL (ref 0–4)

## 2017-08-22 ENCOUNTER — Encounter: Payer: Self-pay | Admitting: Internal Medicine

## 2017-08-22 DIAGNOSIS — E118 Type 2 diabetes mellitus with unspecified complications: Secondary | ICD-10-CM

## 2017-08-22 MED ORDER — FREESTYLE LIBRE SENSOR SYSTEM MISC: 1 refills | Status: DC

## 2017-09-16 DIAGNOSIS — R350 Frequency of micturition: Secondary | ICD-10-CM | POA: Diagnosis not present

## 2017-09-16 DIAGNOSIS — N760 Acute vaginitis: Secondary | ICD-10-CM | POA: Diagnosis not present

## 2017-09-16 DIAGNOSIS — N200 Calculus of kidney: Secondary | ICD-10-CM | POA: Diagnosis not present

## 2017-09-28 ENCOUNTER — Other Ambulatory Visit: Payer: Self-pay | Admitting: Internal Medicine

## 2017-09-28 DIAGNOSIS — E118 Type 2 diabetes mellitus with unspecified complications: Secondary | ICD-10-CM

## 2017-10-14 ENCOUNTER — Ambulatory Visit: Payer: BLUE CROSS/BLUE SHIELD | Admitting: Family

## 2017-10-14 ENCOUNTER — Encounter: Payer: Self-pay | Admitting: Family

## 2017-10-14 VITALS — BP 126/78 | HR 73 | Temp 97.4°F | Ht 59.0 in | Wt 163.0 lb

## 2017-10-14 DIAGNOSIS — M79604 Pain in right leg: Secondary | ICD-10-CM

## 2017-10-14 MED ORDER — MELOXICAM 15 MG PO TABS: 15.0000 mg | ORAL_TABLET | Freq: Every day | ORAL | 0 refills | Status: DC

## 2017-10-14 NOTE — Progress Notes (Signed)
Angel Gordon is a 62 y.o. female with the following history as recorded in Epic[employer]:  Patient Active Problem List   Diagnosis Date Noted  . Acute low back pain with sciatica 07/22/2017  . Kidney stones 09/27/2016  . Fatty liver disease, nonalcoholic 54/27/0623  . Gastroesophageal reflux disease with esophagitis 10/28/2015  . Paraproteinemia 10/03/2015  . Hematuria, microscopic 06/29/2015  . Atopic dermatitis 05/14/2015  . Allergic rhinitis due to pollen 06/16/2014  . Obesity (BMI 30-39.9) 09/21/2010  . Routine health maintenance 09/21/2010  . Type II diabetes mellitus with manifestations ([place]) 04/03/2007    Current Outpatient Medications  Medication Sig Dispense Refill  . Continuous Blood Gluc Receiver (FREESTYLE LIBRE READER) DEVI 1 Act by Does not apply route 2 (two) times daily. 1 Device 1  . Continuous Blood Gluc Sensor (FREESTYLE LIBRE SENSOR SYSTEM) MISC USE AS DIRECTED 3 each 1  . Continuous Blood Gluc Sensor (FREESTYLE LIBRE SENSOR SYSTEM) MISC Use as instructed. 6 each 1  . dapagliflozin propanediol (FARXIGA) 5 MG TABS tablet Take 5 mg by mouth daily. 90 tablet 1  . esomeprazole (NEXIUM) 40 MG capsule Take 1 capsule (40 mg total) by mouth daily at 12 noon. 90 capsule 3  . ibuprofen (ADVIL,MOTRIN) 800 MG tablet TAKE 1 TABLET BY MOUTH EVERY 8 HOURS AS NEEDED 90 tablet 0  . metFORMIN (GLUCOPHAGE) 500 MG tablet Take 1 tablet (500 mg total) by mouth 2 (two) times daily. 180 tablet 1  . triamcinolone (NASACORT AQ) 55 MCG/ACT AERO nasal inhaler Place 2 sprays into the nose daily. 32.4 mL 3  . meloxicam (MOBIC) 15 MG tablet Take 1 tablet (15 mg total) by mouth daily. 20 tablet 0   No current facility-administered medications for this visit.     Allergies: Patient has no known allergies.  Past Medical History:  Diagnosis Date  . Type II or unspecified type diabetes mellitus without mention of complication, not stated as uncontrolled    metformin 500mg  bid-    Past Surgical  History:  Procedure Laterality Date  . COLONOSCOPY  2010  . DILATION AND CURETTAGE OF UTERUS    . FOOT SURGERY  2002  . HYSTEROSCOPY W/D&C  08/17/2011   Procedure: DILATATION AND CURETTAGE /HYSTEROSCOPY;  Surgeon: Marylynn Pearson, MD;  Location: [facility];  Service: Gynecology;  Laterality: N/A;  . TUBAL LIGATION      Family History  Problem Relation Age of Onset  . Hypertension Mother   . Stroke Mother   . Kidney disease Mother   . Thyroid disease Mother   . Cancer Mother   . Heart disease Mother   . Hypertension Son   . Diabetes Brother   . Cancer Maternal Grandmother        colon  . Kidney disease Maternal Grandmother   . Stroke Maternal Grandmother   . Drug abuse Neg Hx   . Early death Neg Hx   . Hearing loss Neg Hx   . Hyperlipidemia Neg Hx   . Learning disabilities Neg Hx     Social History   Tobacco Use  . Smoking status: Never Smoker  . Smokeless tobacco: Never Used  Substance Use Topics  . Alcohol use: No    Subjective:  Right leg pain x 1 week; exercises daily and notes she twisted her right knee when walking last week; no swelling/ redness in the calf; has been applying heat to area;    Objective:  Vitals:   10/14/17 1617  BP: 126/78  Pulse: 73  Temp: (!) 97.4 F (36.3 C)  TempSrc: Oral  SpO2: 99%  Weight: 163 lb (73.9 kg)  Height: 4\' 11"  (1.499 m)    General: Well developed, well nourished, in no acute distress  Skin : Warm and dry.  Head: Normocephalic and atraumatic  Lungs: Respirations unlabored; Musculoskeletal: No deformities; no active joint inflammation  Extremities: No edema, cyanosis, clubbing  Vessels: Symmetric bilaterally  Neurologic: Alert and oriented; speech intact; face symmetrical; moves all extremities well; CNII-XII intact without focal deficit   Assessment:  1. Right leg pain     Plan:  Suspect muscular; Rx for Mobic 15 mg daily; apply ice; follow-up worse, no better;  Flu shot deferred today; she is due to see her PCP in  January for diabetes follow-up.  No follow-ups on file.  No orders of the defined types were placed in this encounter.   Requested Prescriptions   Signed Prescriptions Disp Refills  . meloxicam (MOBIC) 15 MG tablet 20 tablet 0    Sig: Take 1 tablet (15 mg total) by mouth daily.

## 2017-10-24 ENCOUNTER — Other Ambulatory Visit: Payer: Self-pay | Admitting: Internal Medicine

## 2017-10-24 DIAGNOSIS — E118 Type 2 diabetes mellitus with unspecified complications: Secondary | ICD-10-CM

## 2017-12-02 ENCOUNTER — Ambulatory Visit: Payer: BLUE CROSS/BLUE SHIELD | Admitting: Internal Medicine

## 2017-12-16 ENCOUNTER — Ambulatory Visit (INDEPENDENT_AMBULATORY_CARE_PROVIDER_SITE_OTHER)
Admission: RE | Admit: 2017-12-16 | Discharge: 2017-12-16 | Disposition: A | Discharge status: Home / Self Care | Payer: BLUE CROSS/BLUE SHIELD | Source: Ambulatory Visit | Attending: Internal Medicine | Admitting: Internal Medicine

## 2017-12-16 ENCOUNTER — Ambulatory Visit: Payer: BLUE CROSS/BLUE SHIELD | Admitting: Internal Medicine

## 2017-12-16 ENCOUNTER — Encounter: Payer: Self-pay | Admitting: Internal Medicine

## 2017-12-16 ENCOUNTER — Other Ambulatory Visit (INDEPENDENT_AMBULATORY_CARE_PROVIDER_SITE_OTHER): Payer: BLUE CROSS/BLUE SHIELD

## 2017-12-16 VITALS — BP 130/76 | HR 80 | Temp 97.9°F | Resp 16 | Wt 167.0 lb

## 2017-12-16 DIAGNOSIS — R3129 Other microscopic hematuria: Secondary | ICD-10-CM | POA: Diagnosis not present

## 2017-12-16 DIAGNOSIS — N183 Chronic kidney disease, stage 3 unspecified: Secondary | ICD-10-CM

## 2017-12-16 DIAGNOSIS — K760 Fatty (change of) liver, not elsewhere classified: Secondary | ICD-10-CM | POA: Diagnosis not present

## 2017-12-16 DIAGNOSIS — R05 Cough: Secondary | ICD-10-CM

## 2017-12-16 DIAGNOSIS — E118 Type 2 diabetes mellitus with unspecified complications: Secondary | ICD-10-CM

## 2017-12-16 DIAGNOSIS — R059 Cough, unspecified: Secondary | ICD-10-CM | POA: Insufficient documentation

## 2017-12-16 DIAGNOSIS — J988 Other specified respiratory disorders: Secondary | ICD-10-CM

## 2017-12-16 LAB — COMPREHENSIVE METABOLIC PANEL
ALT: 19 U/L (ref 0–35)
AST: 19 U/L (ref 0–37)
Albumin: 4.1 g/dL (ref 3.5–5.2)
Alkaline Phosphatase: 82 U/L (ref 39–117)
BUN: 21 mg/dL (ref 6–23)
CO2: 29 mEq/L (ref 19–32)
Calcium: 10.2 mg/dL (ref 8.4–10.5)
Chloride: 102 mEq/L (ref 96–112)
Creatinine, Ser: 1.32 mg/dL — ABNORMAL HIGH (ref 0.40–1.20)
GFR: 52.79 mL/min — ABNORMAL LOW (ref >60.00)
Glucose, Bld: 102 mg/dL — ABNORMAL HIGH (ref 70–99)
Potassium: 3.9 mEq/L (ref 3.5–5.1)
Sodium: 138 mEq/L (ref 135–145)
Total Bilirubin: 0.3 mg/dL (ref 0.2–1.2)
Total Protein: 8.6 g/dL — ABNORMAL HIGH (ref 6.0–8.3)

## 2017-12-16 LAB — URINALYSIS, ROUTINE W REFLEX MICROSCOPIC
Bilirubin Urine: NEGATIVE
Ketones, ur: NEGATIVE
Leukocytes, UA: NEGATIVE
Nitrite: NEGATIVE
Specific Gravity, Urine: 1.01 (ref 1.000–1.030)
Total Protein, Urine: NEGATIVE
Urine Glucose: >=1000 — AB
Urobilinogen, UA: 0.2 (ref 0.0–1.0)
WBC, UA: NONE SEEN (ref 0–?)
pH: 6.5 (ref 5.0–8.0)

## 2017-12-16 LAB — HEMOGLOBIN A1C: Hgb A1c MFr Bld: 6.6 % — ABNORMAL HIGH (ref 4.6–6.5)

## 2017-12-16 MED ORDER — HYDROCODONE-HOMATROPINE 5-1.5 MG/5ML PO SYRP: 5.0000 mL | ORAL_SOLUTION | Freq: Three times a day (TID) | ORAL | 0 refills | Status: DC | PRN

## 2017-12-16 NOTE — Patient Instructions (Signed)
Cough, Adult  Coughing is a reflex that clears your throat and your airways. Coughing helps to heal and protect your lungs. It is normal to cough occasionally, but a cough that happens with other symptoms or lasts a long time may be a sign of a condition that needs treatment. A cough may last only 2-3 weeks (acute), or it may last longer than 8 weeks (chronic).  What are the causes?  Coughing is commonly caused by:   Breathing in substances that irritate your lungs.   A viral or bacterial respiratory infection.   Allergies.   Asthma.   Postnasal drip.   Smoking.   Acid backing up from the stomach into the esophagus (gastroesophageal reflux).   Certain medicines.   Chronic lung problems, including COPD (or rarely, lung cancer).   Other medical conditions such as heart failure.    Follow these instructions at home:  Pay attention to any changes in your symptoms. Take these actions to help with your discomfort:   Take medicines only as told by your health care provider.  ? If you were prescribed an antibiotic medicine, take it as told by your health care provider. Do not stop taking the antibiotic even if you start to feel better.  ? Talk with your health care provider before you take a cough suppressant medicine.   Drink enough fluid to keep your urine clear or pale yellow.   If the air is dry, use a cold steam vaporizer or humidifier in your bedroom or your home to help loosen secretions.   Avoid anything that causes you to cough at work or at home.   If your cough is worse at night, try sleeping in a semi-upright position.   Avoid cigarette smoke. If you smoke, quit smoking. If you need help quitting, ask your health care provider.   Avoid caffeine.   Avoid alcohol.   Rest as needed.    Contact a health care provider if:   You have new symptoms.   You cough up pus.   Your cough does not get better after 2-3 weeks, or your cough gets worse.   You cannot control your cough with suppressant  medicines and you are losing sleep.   You develop pain that is getting worse or pain that is not controlled with pain medicines.   You have a fever.   You have unexplained weight loss.   You have night sweats.  Get help right away if:   You cough up blood.   You have difficulty breathing.   Your heartbeat is very fast.  This information is not intended to replace advice given to you by your health care provider. Make sure you discuss any questions you have with your health care provider.  Document Released: 07/07/2010 Document Revised: 06/16/2015 Document Reviewed: 03/17/2014  Elsevier Interactive Patient Education  2018 [employer]

## 2017-12-16 NOTE — Progress Notes (Signed)
Subjective:  Patient ID: Angel Gordon, female    DOB: 03-Aug-1955  Age: 61 y.o. MRN: [id number]  CC: Cough   HPI Angel Gordon presents for a one-week history of nonproductive cough with sore throat and night sweats.  She denies fever, chills, chest pain, hemoptysis, shortness of breath, or wheezing.  Outpatient Medications Prior to Visit  Medication Sig Dispense Refill  . Continuous Blood Gluc Receiver (FREESTYLE LIBRE READER) DEVI 1 Act by Does not apply route 2 (two) times daily. 1 Device 1  . Continuous Blood Gluc Sensor (FREESTYLE LIBRE SENSOR SYSTEM) MISC USE AS DIRECTED 3 each 1  . Continuous Blood Gluc Sensor (FREESTYLE LIBRE SENSOR SYSTEM) MISC Use as instructed. 6 each 1  . dapagliflozin propanediol (FARXIGA) 5 MG TABS tablet Take 5 mg by mouth daily. 90 tablet 1  . esomeprazole (NEXIUM) 40 MG capsule Take 1 capsule (40 mg total) by mouth daily at 12 noon. 90 capsule 3  . meloxicam (MOBIC) 15 MG tablet Take 1 tablet (15 mg total) by mouth daily. 20 tablet 0  . metFORMIN (GLUCOPHAGE) 500 MG tablet Take 1 tablet (500 mg total) by mouth 2 (two) times daily. 180 tablet 1  . triamcinolone (NASACORT AQ) 55 MCG/ACT AERO nasal inhaler Place 2 sprays into the nose daily. 32.4 mL 3  . dapagliflozin propanediol (FARXIGA) 5 MG TABS tablet Take 5 mg by mouth daily. 90 tablet 1  . ibuprofen (ADVIL,MOTRIN) 800 MG tablet TAKE 1 TABLET BY MOUTH EVERY 8 HOURS AS NEEDED 90 tablet 0   No facility-administered medications prior to visit.     ROS Review of Systems  Constitutional: Negative.  Negative for chills, diaphoresis, fatigue and fever.  HENT: Positive for sore throat. Negative for sinus pressure, trouble swallowing and voice change.   Respiratory: Positive for cough. Negative for shortness of breath and wheezing.   Cardiovascular: Negative for chest pain, palpitations and leg swelling.  Gastrointestinal: Negative for abdominal pain, constipation, diarrhea, nausea and vomiting.    Endocrine: Negative.   Genitourinary: Negative.  Negative for decreased urine volume, difficulty urinating, dysuria, hematuria and urgency.  Musculoskeletal: Negative.  Negative for arthralgias and myalgias.  Skin: Negative.  Negative for color change and rash.  Neurological: Negative.  Negative for dizziness, weakness and light-headedness.  Hematological: Negative for adenopathy. Does not bruise/bleed easily.  Psychiatric/Behavioral: Negative.     Objective:  BP 130/76   Pulse 80   Temp 97.9 F (36.6 C) (Oral)   Resp 16   Wt 167 lb (75.8 kg)   SpO2 97%   BMI 33.73 kg/m   BP Readings from Last 3 Encounters:  12/16/17 130/76  10/14/17 126/78  07/31/17 118/74    Wt Readings from Last 3 Encounters:  12/16/17 167 lb (75.8 kg)  10/14/17 163 lb (73.9 kg)  07/31/17 163 lb (73.9 kg)    Physical Exam  Constitutional: She is oriented to person, place, and time. No distress.  HENT:  Mouth/Throat: Oropharynx is clear and moist. No oropharyngeal exudate.  Eyes: Conjunctivae are normal. No scleral icterus.  Neck: Normal range of motion. Neck supple. No JVD present. No thyromegaly present.  Cardiovascular: Normal rate, regular rhythm and normal heart sounds. Exam reveals no gallop.  No murmur heard. Pulmonary/Chest: Effort normal and breath sounds normal. No respiratory distress. She has no wheezes. She has no rhonchi. She has no rales.  Abdominal: Soft. Bowel sounds are normal. She exhibits no mass. There is no hepatosplenomegaly. There is no tenderness.  Musculoskeletal: Normal  range of motion. She exhibits no edema, tenderness or deformity.  Lymphadenopathy:    She has no cervical adenopathy.  Neurological: She is alert and oriented to person, place, and time.  Skin: Skin is warm and dry. She is not diaphoretic. No pallor.  Vitals reviewed.   Lab Results  Component Value Date   WBC 7.0 07/16/2017   HGB 12.9 07/16/2017   HCT 39.9 07/16/2017   PLT 234.0 07/16/2017    GLUCOSE 102 (H) 12/16/2017   CHOL 125 07/16/2017   TRIG 62.0 07/16/2017   HDL 63.20 07/16/2017   LDLCALC 49 07/16/2017   ALT 19 12/16/2017   AST 19 12/16/2017   NA 138 12/16/2017   K 3.9 12/16/2017   CL 102 12/16/2017   CREATININE 1.32 (H) 12/16/2017   BUN 21 12/16/2017   CO2 29 12/16/2017   TSH 1.68 07/16/2017   HGBA1C 6.6 (H) 12/16/2017   MICROALBUR 0.7 07/16/2017    Ct Renal Stone Study  Result Date: 08/01/2017 CLINICAL DATA:  1 1/2 week hx of Rt flank pain. Microscopic hematuria. Eval for kid stone. EXAM: CT ABDOMEN AND PELVIS WITHOUT CONTRAST TECHNIQUE: Multidetector CT imaging of the abdomen and pelvis was performed following the standard protocol without IV contrast. COMPARISON:  CT abdomen dated 06/30/2015. FINDINGS: Lower chest: No acute abnormality. Hepatobiliary: No focal liver abnormality is seen. No gallstones, gallbladder wall thickening, or biliary dilatation. Pancreas: Unremarkable. No pancreatic ductal dilatation or surrounding inflammatory changes. Spleen: Normal in size without focal abnormality. Adrenals/Urinary Tract: Adrenal glands appear normal. Stable nonobstructing stone within the lower pole of the RIGHT renal pelvis, measuring 10 mm. LEFT kidney appears normal without mass, stone or hydronephrosis. No ureteral or bladder calculi. Bladder is unremarkable, decompressed. Stomach/Bowel: Bowel is normal in caliber. No bowel wall thickening or evidence of bowel wall inflammation. Appendix is normal. Stomach appears normal. Vascular/Lymphatic: No significant vascular findings are present. No enlarged abdominal or pelvic lymph nodes. Reproductive: Uterus and bilateral adnexa are unremarkable. Other: No free fluid or abscess collection. No free intraperitoneal air. Musculoskeletal: No acute or significant osseous findings. IMPRESSION: 1. No acute findings within the abdomen or pelvis. No ureteral stone or hydronephrosis. No free fluid. No bowel obstruction or evidence of bowel  wall inflammation. 2. Stable 10 mm RIGHT renal stone. Electronically Signed   By: Franki Cabot M.D.   On: 08/01/2017 16:25    Assessment & Plan:   Angel Gordon was seen today for cough.  Diagnoses and all orders for this visit:  Hematuria, microscopic- This has resolved. -     Urinalysis, Routine w reflex microscopic; Future  Type II diabetes mellitus with manifestations ([place])- Her A1c is at 6.6%.  Her blood sugars are adequately well controlled. -     Comprehensive metabolic panel; Future -     Hemoglobin A1c; Future  Fatty liver disease, nonalcoholic - Her LFTs are normal now. -     Comprehensive metabolic panel; Future  Cough- Her chest x-ray is negative for mass or infiltrate. -     DG Chest 2 View; Future  RTI (respiratory tract infection)- Her symptoms are consistent with a viral URI.  Will offer symptom relief. -     HYDROcodone-homatropine (HYCODAN) 5-1.5 MG/5ML syrup; Take 5 mLs by mouth every 8 (eight) hours as needed for cough.  Chronic renal disease, stage 3, moderately decreased glomerular filtration rate (GFR) between 30-59 mL/min/1.73 square meter ([place])- her GFR is down to 40 and she has an elevated total protein level.  I have  asked her to see nephrology to have this evaluated and to consider treatment options. -     Ambulatory referral to Nephrology   I have discontinued Anderson Malta E. Alas's ibuprofen. I am also having her start on HYDROcodone-homatropine. Additionally, I am having her maintain her esomeprazole, triamcinolone, FREESTYLE LIBRE READER, metFORMIN, FREESTYLE LIBRE SENSOR SYSTEM, [place], meloxicam, and dapagliflozin propanediol.  Meds ordered this encounter  Medications  . HYDROcodone-homatropine (HYCODAN) 5-1.5 MG/5ML syrup    Sig: Take 5 mLs by mouth every 8 (eight) hours as needed for cough.    Dispense:  120 mL    Refill:  0     Follow-up: Return if symptoms worsen or fail to improve.  Scarlette Calico, MD

## 2017-12-17 ENCOUNTER — Encounter: Payer: Self-pay | Admitting: Internal Medicine

## 2017-12-17 DIAGNOSIS — N183 Chronic kidney disease, stage 3 unspecified: Secondary | ICD-10-CM | POA: Insufficient documentation

## 2018-01-13 ENCOUNTER — Encounter: Payer: Self-pay | Admitting: Internal Medicine

## 2018-01-13 DIAGNOSIS — E118 Type 2 diabetes mellitus with unspecified complications: Secondary | ICD-10-CM

## 2018-01-13 MED ORDER — FREESTYLE LIBRE SENSOR SYSTEM MISC: 1 refills | Status: DC

## 2018-01-16 IMAGING — DX DG CHEST 2V
2 series · 2 of 2 positions shown · non-contrast
Comparison: None.

CLINICAL DATA: Chest tightness severe burning sensation in the back
intermittently for 2-3 weeks. Night sweats.

EXAM:
CHEST  2 VIEW

[chest pa]
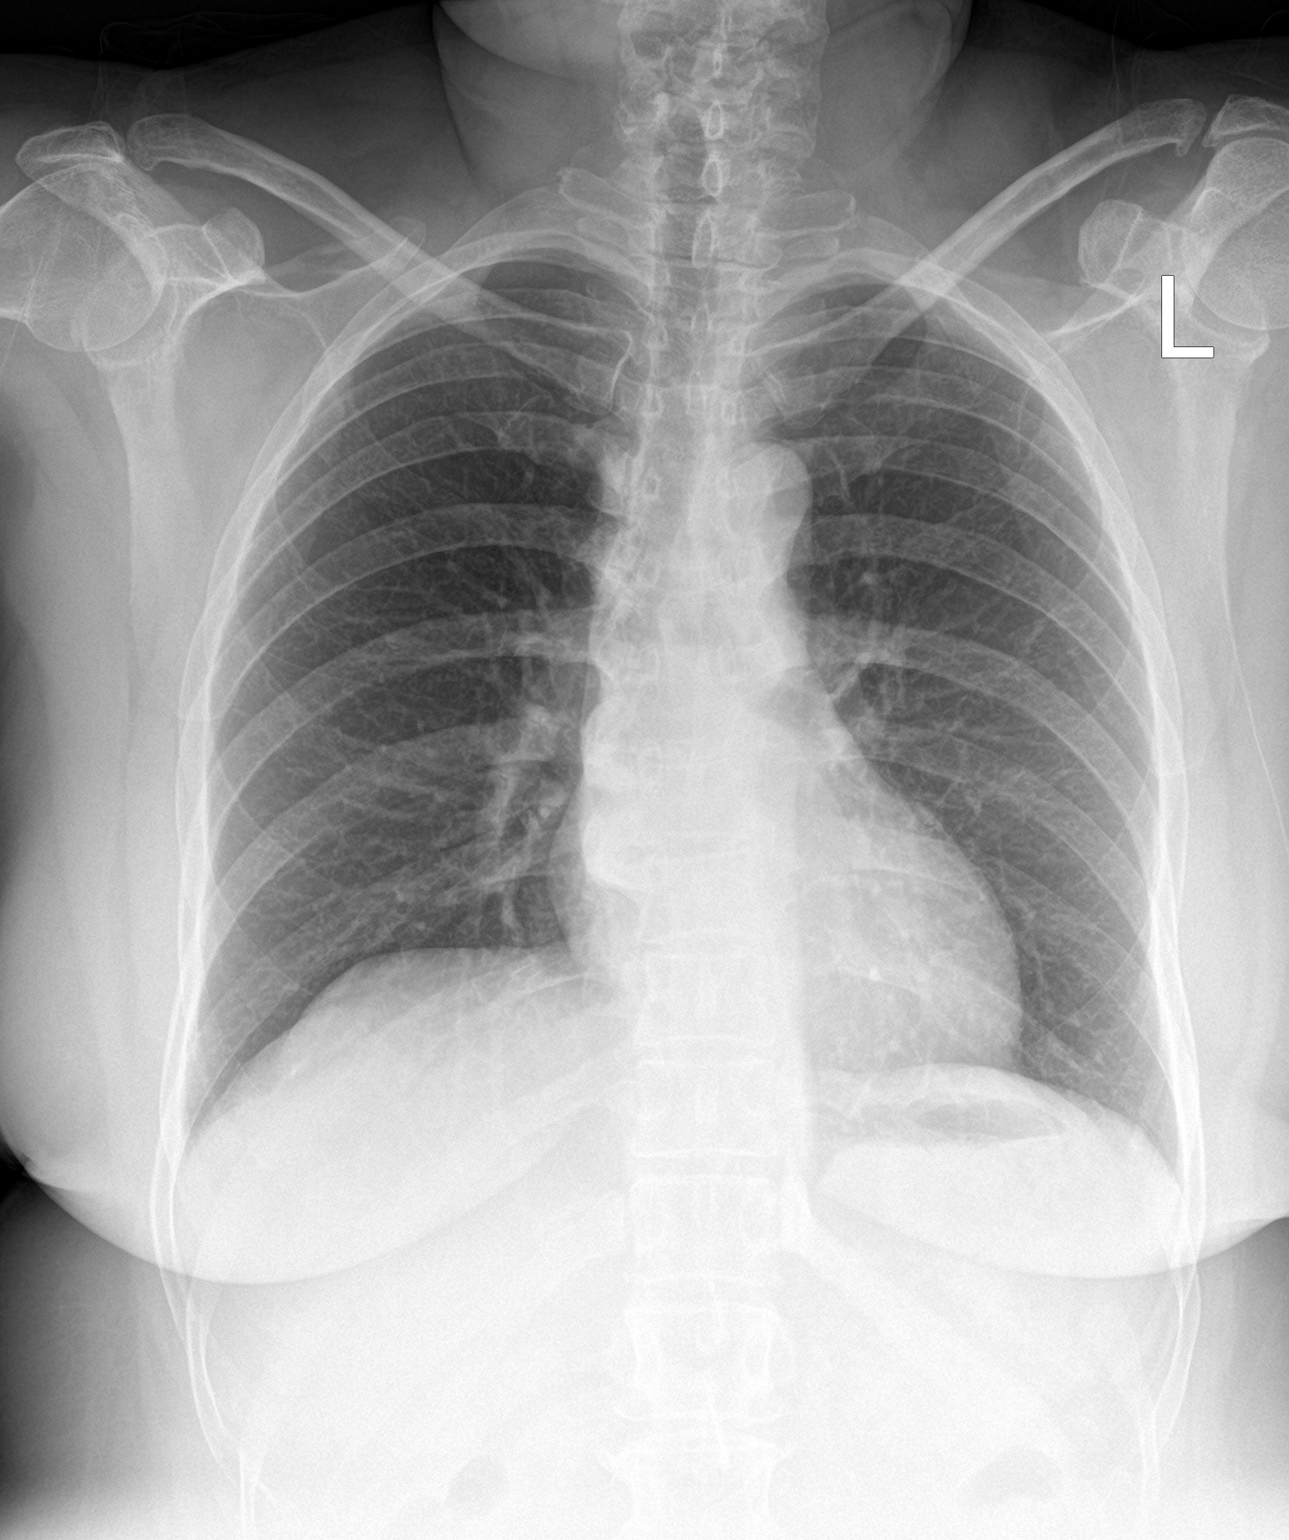

[chest lat]
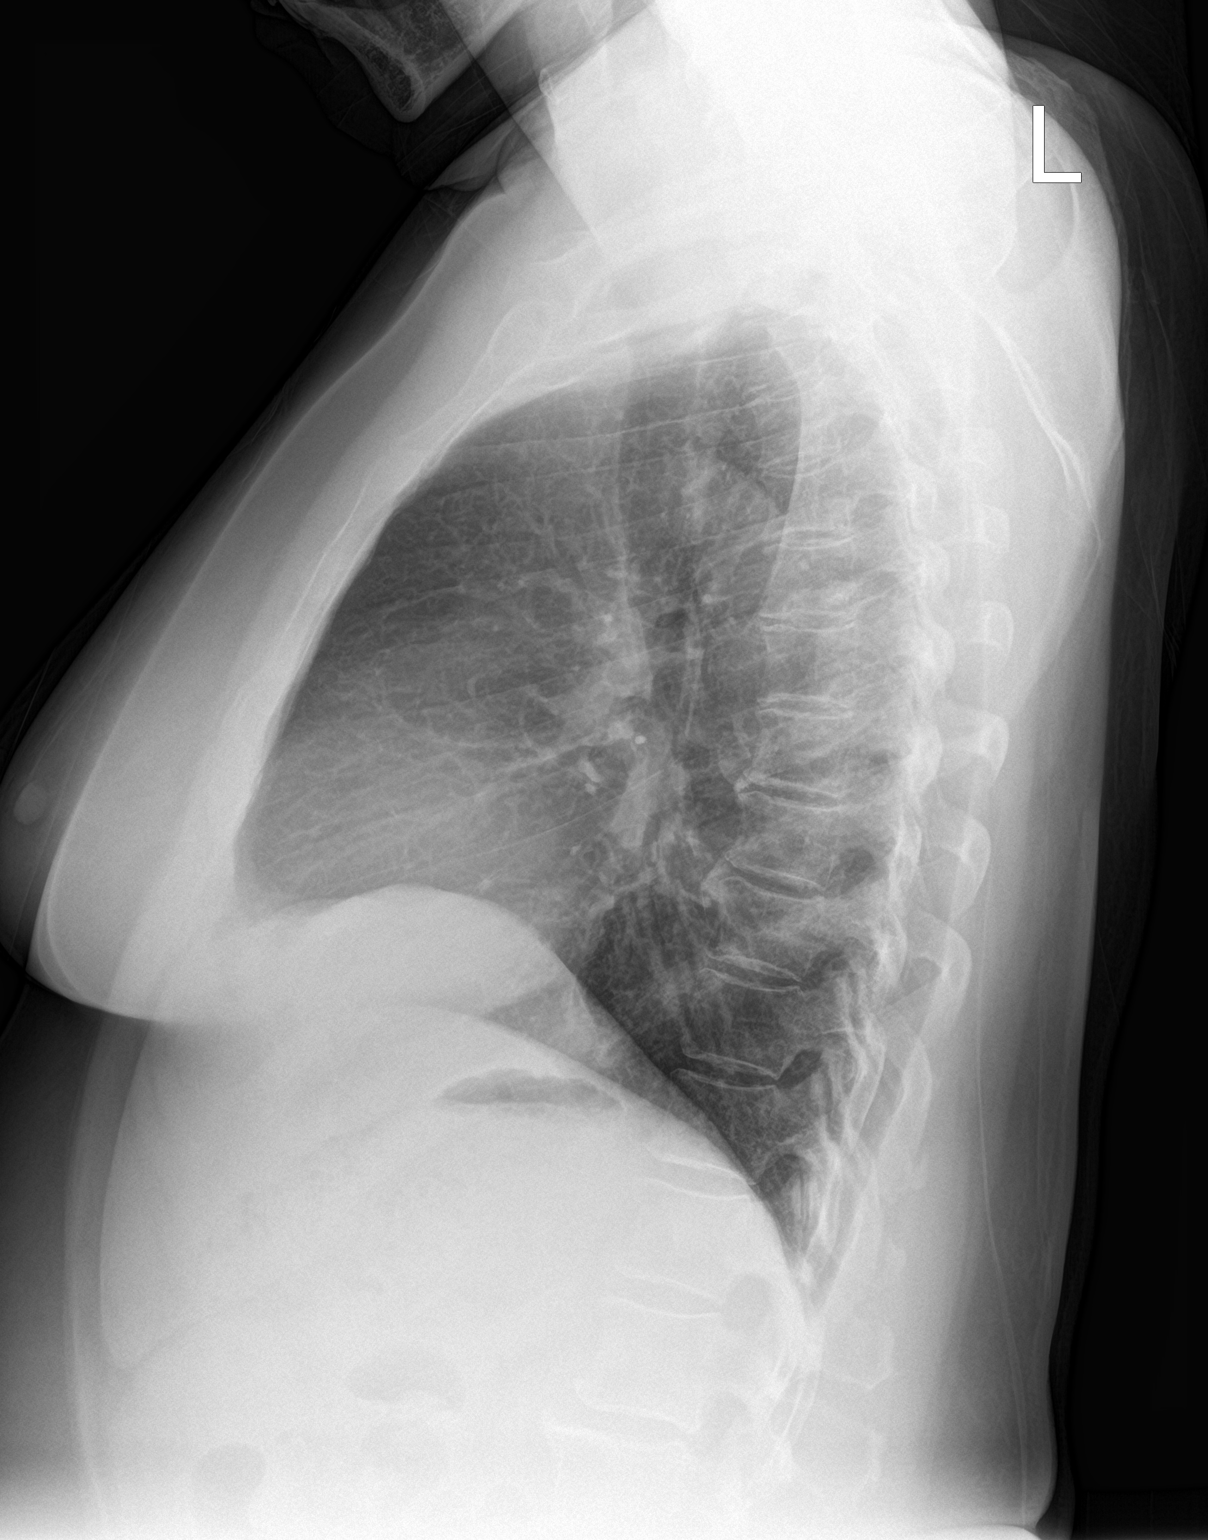

[2 of 2 positions shown; findings below may reference images not displayed]

FINDINGS: The heart size and mediastinal contours are within normal limits.
Both lungs are clear. The visualized skeletal structures are
unremarkable.
IMPRESSION: No active cardiopulmonary disease.

## 2018-01-16 IMAGING — DX DG THORACIC SPINE 3V
2 series · 2 of 2 positions shown · non-contrast
Comparison: CT abdomen pelvis 06/28/2015

CLINICAL DATA: Chest tightness AND sever burning sensation in back
x intermittent x 2-3 wks, NKI.

EXAM:
THORACIC SPINE - 3 VIEWS

[t-spine ap]
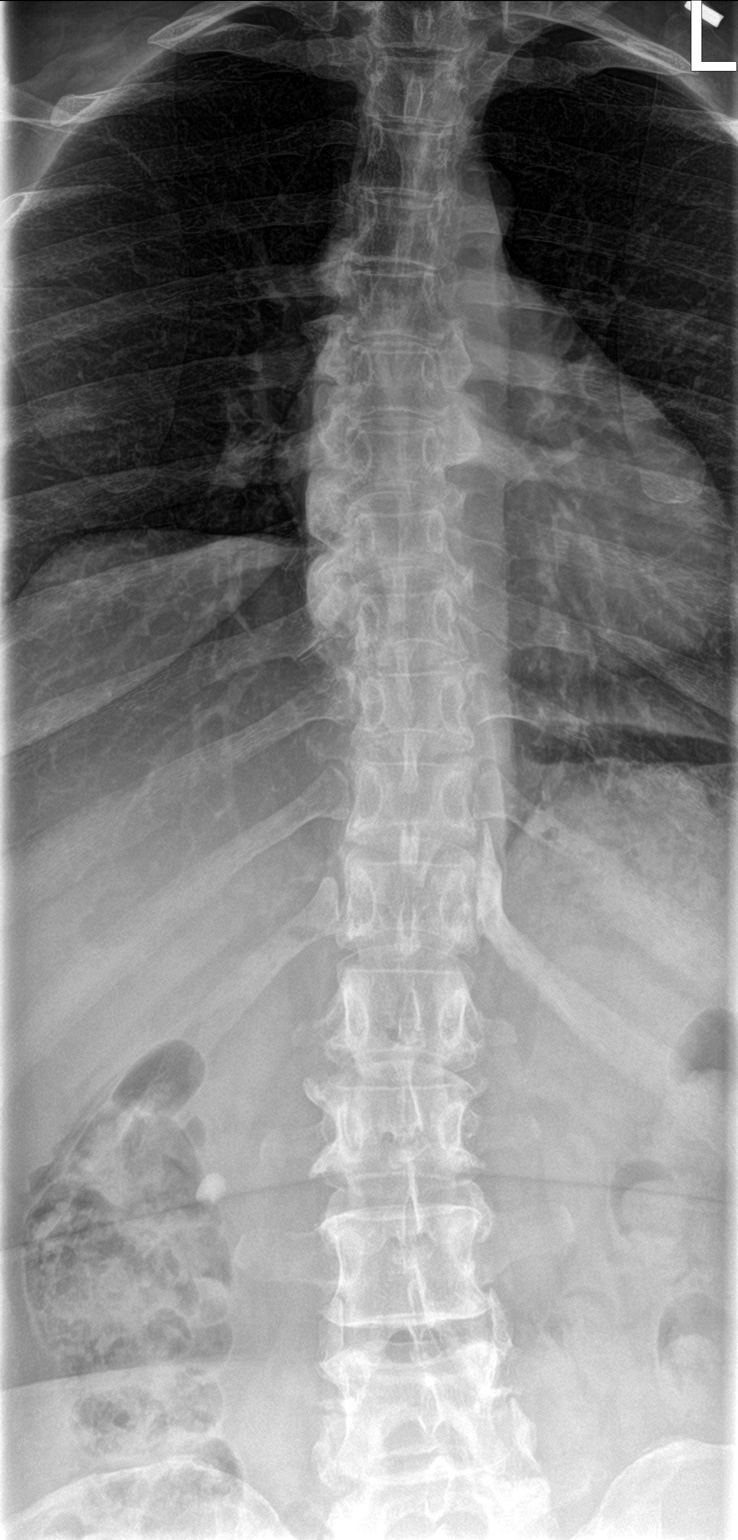

[t-spine lat]
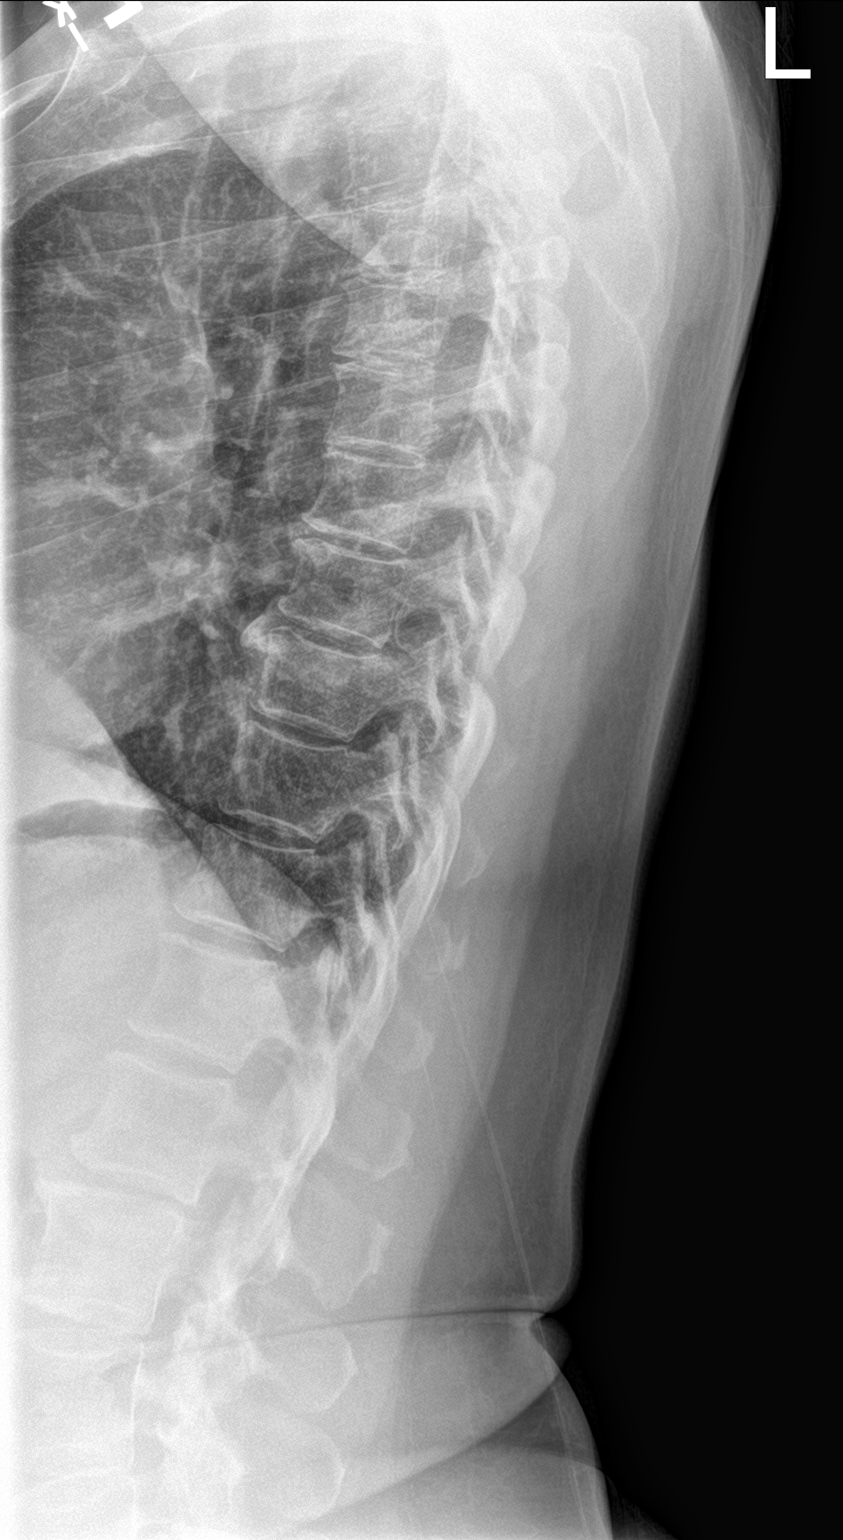

[2 of 2 positions shown; findings below may reference images not displayed]

FINDINGS: Osteophytosis throughout the thoracic spine. Mild scoliosis. No
acute loss vertebral body height or disc height. RIGHT renal
calculus measuring 9 mm.
IMPRESSION: Disc osteophytic disease of the thoracic spine without acute
findings.

RIGHT renal calculus

## 2018-01-20 ENCOUNTER — Ambulatory Visit: Payer: BLUE CROSS/BLUE SHIELD | Admitting: Internal Medicine

## 2018-01-23 DIAGNOSIS — R1084 Generalized abdominal pain: Secondary | ICD-10-CM | POA: Diagnosis not present

## 2018-01-23 DIAGNOSIS — N200 Calculus of kidney: Secondary | ICD-10-CM | POA: Diagnosis not present

## 2018-01-23 LAB — HM DIABETES EYE EXAM

## 2018-01-24 ENCOUNTER — Ambulatory Visit: Payer: BLUE CROSS/BLUE SHIELD | Admitting: Podiatry

## 2018-01-24 ENCOUNTER — Encounter: Payer: Self-pay | Admitting: Podiatry

## 2018-01-24 DIAGNOSIS — M79676 Pain in unspecified toe(s): Secondary | ICD-10-CM

## 2018-01-24 DIAGNOSIS — E1149 Type 2 diabetes mellitus with other diabetic neurological complication: Secondary | ICD-10-CM | POA: Diagnosis not present

## 2018-01-24 DIAGNOSIS — B351 Tinea unguium: Secondary | ICD-10-CM

## 2018-01-24 DIAGNOSIS — E1140 Type 2 diabetes mellitus with diabetic neuropathy, unspecified: Secondary | ICD-10-CM | POA: Diagnosis not present

## 2018-01-24 DIAGNOSIS — L84 Corns and callosities: Secondary | ICD-10-CM | POA: Diagnosis not present

## 2018-01-25 NOTE — Progress Notes (Signed)
Subjective:   Patient ID: Angel Gordon, female   DOB: 63 y.o.   MRN: [id number]   HPI Patient presents for diabetic foot and evaluation history of thick toenail deformity and keratotic lesion sub-first metatarsal right that is painful stating she is had diabetes for a long time and has been under reasonably good control but does have mild neurological symptoms   ROS      Objective:  Physical Exam  Vascular status intact with mild diminishment sharp dull vibratory bilateral with keratotic lesion sub-first metatarsal right that is moderately painful when pressed with thick nail disease 1-5 both feet that she is able to take care of herself     Assessment:  Long-term diabetic with mild neuropathic symptoms that are asymptomatic currently with keratotic lesion sub-first metatarsal right that is painful and mild thick toenail disease     Plan:  H&P reviewed diabetic neuropathy and the importance of controlling her sugar.  Debrided the lesion of the sub-first metatarsal right and went ahead today and advised this patient on good nail care and daily diabetic foot inspections and to come in if any changes were to occur

## 2018-01-28 ENCOUNTER — Encounter (HOSPITAL_BASED_OUTPATIENT_CLINIC_OR_DEPARTMENT_OTHER): Payer: Self-pay | Admitting: Emergency Medicine

## 2018-01-28 ENCOUNTER — Emergency Department (HOSPITAL_BASED_OUTPATIENT_CLINIC_OR_DEPARTMENT_OTHER): Payer: BLUE CROSS/BLUE SHIELD

## 2018-01-28 ENCOUNTER — Encounter: Payer: Self-pay | Admitting: Internal Medicine

## 2018-01-28 ENCOUNTER — Emergency Department (HOSPITAL_BASED_OUTPATIENT_CLINIC_OR_DEPARTMENT_OTHER)
Admission: EM | Admit: 2018-01-28 | Discharge: 2018-01-28 | Disposition: A | Discharge status: Home / Self Care | Payer: BLUE CROSS/BLUE SHIELD | Attending: Emergency Medicine | Admitting: Emergency Medicine

## 2018-01-28 ENCOUNTER — Ambulatory Visit: Payer: Self-pay

## 2018-01-28 ENCOUNTER — Other Ambulatory Visit: Payer: Self-pay

## 2018-01-28 DIAGNOSIS — N183 Chronic kidney disease, stage 3 (moderate): Secondary | ICD-10-CM | POA: Diagnosis not present

## 2018-01-28 DIAGNOSIS — Z7984 Long term (current) use of oral hypoglycemic drugs: Secondary | ICD-10-CM | POA: Insufficient documentation

## 2018-01-28 DIAGNOSIS — M79604 Pain in right leg: Secondary | ICD-10-CM | POA: Diagnosis not present

## 2018-01-28 DIAGNOSIS — M25561 Pain in right knee: Secondary | ICD-10-CM | POA: Diagnosis not present

## 2018-01-28 DIAGNOSIS — E1122 Type 2 diabetes mellitus with diabetic chronic kidney disease: Secondary | ICD-10-CM | POA: Diagnosis not present

## 2018-01-28 DIAGNOSIS — I83811 Varicose veins of right lower extremities with pain: Secondary | ICD-10-CM | POA: Diagnosis not present

## 2018-01-28 DIAGNOSIS — Z79899 Other long term (current) drug therapy: Secondary | ICD-10-CM | POA: Insufficient documentation

## 2018-01-28 DIAGNOSIS — M7989 Other specified soft tissue disorders: Secondary | ICD-10-CM | POA: Diagnosis not present

## 2018-01-28 DIAGNOSIS — M79661 Pain in right lower leg: Secondary | ICD-10-CM | POA: Diagnosis not present

## 2018-01-28 MED ORDER — DICLOFENAC SODIUM 1 % TD GEL: 2.0000 g | Freq: Four times a day (QID) | TRANSDERMAL | 0 refills | Status: DC

## 2018-01-28 NOTE — Telephone Encounter (Signed)
Pt. Reports she started having right leg pain and swelling. Pain is behind her knee. Pain today is 7-8. Looks "like the veins are swollen in that leg." "The veins are burning." No availability at provider's. Will go to UC for evaluation.  Reason for Disposition . Localized pain, redness or hard lump along vein . [1] Thigh, calf, or ankle swelling AND [2] bilateral AND [3] 1 side is more swollen  Answer Assessment - Initial Assessment Questions 1. ONSET: "When did the pain start?"      Started yesterday 2. LOCATION: "Where is the pain located?"      Right leg - behind her knee 3. PAIN: "How bad is the pain?"    (Scale 1-10; or mild, moderate, severe)   -  MILD (1-3): doesn't interfere with normal activities    -  MODERATE (4-7): interferes with normal activities (e.g., work or school) or awakens from sleep, limping    -  SEVERE (8-10): excruciating pain, unable to do any normal activities, unable to walk     7-8 4. WORK OR EXERCISE: "Has there been any recent work or exercise that involved this part of the body?"      No 5. CAUSE: "What do you think is causing the leg pain?"     Maybe a blood clot 6. OTHER SYMPTOMS: "Do you have any other symptoms?" (e.g., chest pain, back pain, breathing difficulty, swelling, rash, fever, numbness, weakness)     Veins are "getting bigger" 7. PREGNANCY: "Is there any chance you are pregnant?" "When was your last menstrual period?"     No  Protocols used: LEG PAIN-A-AH

## 2018-01-28 NOTE — ED Triage Notes (Addendum)
Pain behind her R knee since yesterday. Denies injury. States pcp told her to come get a xray.

## 2018-01-28 NOTE — ED Provider Notes (Signed)
[facility] EMERGENCY DEPARTMENT Provider Note   CSN: [id number] Arrival date & time: 01/28/18  1437     History   Chief Complaint Chief Complaint  Patient presents with  . Knee Pain    HPI Angel Gordon is a 63 y.o. female with a hx of T2DM, obesity, and GERD who presents to the ED with complaints of RLE pain that started yesterday. Patient states pain is primarily in the R posterior knee and radiates into the calf. The pain is a cramping/sharp/burning sensation. It is worse with movement, no alleviating factors. Tried some application of ice/heat without much change. She states that the veins look enlarged in the back of the leg. She states the leg in general may be a bit swollen. Denies color change, warmth, fever, chills, numbness, tingling, or weakness. Denies chest pain/dyspnea. Denies injury or activity change. Denies recent surgery/trauma, recent long travel, hormone use, personal hx of cancer, or hx of DVT/PE.   HPI  Past Medical History:  Diagnosis Date  . Type II or unspecified type diabetes mellitus without mention of complication, not stated as uncontrolled    metformin 500mg  bid-    Patient Active Problem List   Diagnosis Date Noted  . Chronic renal disease, stage 3, moderately decreased glomerular filtration rate (GFR) between 30-59 mL/min/1.73 square meter (HCC) 12/17/2017  . Cough 12/16/2017  . RTI (respiratory tract infection) 12/16/2017  . Kidney stones 09/27/2016  . Fatty liver disease, nonalcoholic 02/54/2706  . Gastroesophageal reflux disease with esophagitis 10/28/2015  . Paraproteinemia 10/03/2015  . Hematuria, microscopic 06/29/2015  . Atopic dermatitis 05/14/2015  . Allergic rhinitis due to pollen 06/16/2014  . Obesity (BMI 30-39.9) 09/21/2010  . Routine health maintenance 09/21/2010  . Type II diabetes mellitus with manifestations ([place]) 04/03/2007    Past Surgical History:  Procedure Laterality Date  . COLONOSCOPY  2010  .  DILATION AND CURETTAGE OF UTERUS    . FOOT SURGERY  2002  . HYSTEROSCOPY W/D&C  08/17/2011   Procedure: DILATATION AND CURETTAGE /HYSTEROSCOPY;  Surgeon: Marylynn Pearson, MD;  Location: [facility];  Service: Gynecology;  Laterality: N/A;  . TUBAL LIGATION       OB History   No obstetric history on file.      Home Medications    Prior to Admission medications   Medication Sig Start Date End Date Taking? Authorizing Provider  Continuous Blood Gluc Receiver (FREESTYLE LIBRE READER) DEVI 1 Act by Does not apply route 2 (two) times daily. 09/27/16   Janith Lima, MD  Continuous Blood Gluc Sensor ([place]) MISC Use as instructed. 08/22/17   Janith Lima, MD  Continuous Blood Gluc Sensor (FREESTYLE LIBRE SENSOR SYSTEM) MISC USE AS DIRECTED 01/13/18   Janith Lima, MD  dapagliflozin propanediol (FARXIGA) 5 MG TABS tablet Take 5 mg by mouth daily. 10/25/17   Janith Lima, MD  esomeprazole (NEXIUM) 40 MG capsule Take 1 capsule (40 mg total) by mouth daily at 12 noon. 10/28/15   Janith Lima, MD  HYDROcodone-homatropine [facility]) 5-1.5 MG/5ML syrup Take 5 mLs by mouth every 8 (eight) hours as needed for cough. 12/16/17   Janith Lima, MD  meloxicam (MOBIC) 15 MG tablet Take 1 tablet (15 mg total) by mouth daily. 10/14/17   Marrian Salvage, FNP  metFORMIN (GLUCOPHAGE) 500 MG tablet Take 1 tablet (500 mg total) by mouth 2 (two) times daily. 07/17/17   Janith Lima, MD  triamcinolone (NASACORT AQ) 55 MCG/ACT  AERO nasal inhaler Place 2 sprays into the nose daily. 05/07/16   Janith Lima, MD    Family History Family History  Problem Relation Age of Onset  . Hypertension Mother   . Stroke Mother   . Kidney disease Mother   . Thyroid disease Mother   . Cancer Mother   . Heart disease Mother   . Hypertension Son   . Diabetes Brother   . Cancer Maternal Grandmother        colon  . Kidney disease Maternal Grandmother   . Stroke Maternal Grandmother   .  Drug abuse Neg Hx   . Early death Neg Hx   . Hearing loss Neg Hx   . Hyperlipidemia Neg Hx   . Learning disabilities Neg Hx     Social History Social History   Tobacco Use  . Smoking status: Never Smoker  . Smokeless tobacco: Never Used  Substance Use Topics  . Alcohol use: No  . Drug use: No     Allergies   Patient has no known allergies.   Review of Systems Review of Systems  Constitutional: Negative for chills and fever.  Respiratory: Negative for shortness of breath.   Cardiovascular: Positive for leg swelling. Negative for chest pain.  Musculoskeletal: Positive for arthralgias and myalgias.  Skin: Negative for color change, rash and wound.  Neurological: Negative for weakness and numbness.     Physical Exam Updated Vital Signs BP (!) 149/88 (BP Location: Left Arm)   Pulse 67   Temp 97.9 F (36.6 C) (Oral)   Resp 16   Ht 4\' 11"  (1.499 m)   Wt 74.8 kg   SpO2 99%   BMI 33.33 kg/m   Physical Exam Vitals signs and nursing note reviewed.  Constitutional:      General: She is not in acute distress.    Appearance: She is well-developed. She is not toxic-appearing.  HENT:     Head: Normocephalic and atraumatic.  Eyes:     General:        Right eye: No discharge.        Left eye: No discharge.     Conjunctiva/sclera: Conjunctivae normal.  Neck:     Musculoskeletal: Neck supple.  Cardiovascular:     Rate and Rhythm: Normal rate and regular rhythm.  Pulmonary:     Effort: Pulmonary effort is normal. No respiratory distress.     Breath sounds: Normal breath sounds. No wheezing, rhonchi or rales.  Abdominal:     General: There is no distension.     Palpations: Abdomen is soft.     Tenderness: There is no abdominal tenderness.  Musculoskeletal:     Comments: Lower extremities: Patient has notable varicosities to the posterior aspect of her right knee.  There is no overlying erythema or warmth.  No edema.  No swelling.  He has full active range of motion to  bilateral hips and ankles as well as the left knee.  Right knee able to be fully extended, she had does have some discomfort with right knee flexion but is able to do so to 90 degrees.  He is tender to palpation along the posterior right knee extending into the right calf.  No point/focal bony tenderness.  Lower extremities are otherwise nontender.  She is neurovascularly intact distally.  Skin:    General: Skin is warm and dry.     Capillary Refill: Capillary refill takes less than 2 seconds.     Findings: No rash.  Neurological:     Mental Status: She is alert.     Comments: Clear speech. Sensation grossly intact to bilateral lower extremities. 5/5 strength with plantar/dorsiflexion bilaterally. Ambulatory with antalgic gait.   Psychiatric:        Behavior: Behavior normal.      ED Treatments / Results  Labs (all labs ordered are listed, but only abnormal results are displayed) Labs Reviewed - No data to display  EKG None  Radiology US Venous Img Lower Right (dvt Study)  Result Date: 01/28/2018 CLINICAL DATA:  63 year old with right leg pain and swelling. EXAM: RIGHT LOWER EXTREMITY VENOUS DOPPLER ULTRASOUND TECHNIQUE: Gray-scale sonography with graded compression, as well as color Doppler and duplex ultrasound were performed to evaluate the lower extremity deep venous systems from the level of the common femoral vein and including the common femoral, femoral, profunda femoral, popliteal and calf veins including the posterior tibial, peroneal and gastrocnemius veins when visible. The superficial great saphenous vein was also interrogated. Spectral Doppler was utilized to evaluate flow at rest and with distal augmentation maneuvers in the common femoral, femoral and popliteal veins. COMPARISON:  None. FINDINGS: Contralateral Common Femoral Vein: Respiratory phasicity is normal and symmetric with the symptomatic side. No evidence of thrombus. Normal compressibility. Common Femoral Vein: No  evidence of thrombus. Normal compressibility, respiratory phasicity and response to augmentation. Saphenofemoral Junction: No evidence of thrombus. Normal compressibility and flow on color Doppler imaging. Profunda Femoral Vein: No evidence of thrombus. Normal compressibility and flow on color Doppler imaging. Femoral Vein: No evidence of thrombus. Normal compressibility, respiratory phasicity and response to augmentation. Popliteal Vein: No evidence of thrombus. Normal compressibility, respiratory phasicity and response to augmentation. Calf Veins: No evidence of thrombus. Normal compressibility and flow on color Doppler imaging. Superficial Great Saphenous Vein: No evidence of thrombus. Normal compressibility. Other Findings: Visualized short saphenous vein is compressible without thrombus. Compressible varicosities in the right calf. IMPRESSION: Negative for deep venous thrombosis in right lower extremity. Negative for superficial venous thrombosis. However, there are varicose veins in the right calf and findings are compatible with underlying venous insufficiency. Venous insufficiency and varicosities could be related to the patient's symptoms. Electronically Signed   By: Markus Daft M.D.   On: 01/28/2018 16:23   Dg Knee Complete 4 Views Right  Result Date: 01/28/2018 CLINICAL DATA:  Pain without trauma EXAM: RIGHT KNEE - COMPLETE 4+ VIEW COMPARISON:  None. FINDINGS: Marginal spurs about the lateral compartment and from the medial tibial plateau as well as from the patellar articular surface. No fracture or dislocation. No effusion. Normal alignment and mineralization. IMPRESSION: 1. Negative for fracture or other acute bone abnormality. 2. Tricompartmental degenerative spurring, most marked laterally. Electronically Signed   By: Lucrezia Europe M.D.   On: 01/28/2018 14:58    Procedures Procedures (including critical care time)  Medications Ordered in ED Medications - No data to display   Initial  Impression / Assessment and Plan / ED Course  I have reviewed the triage vital signs and the nursing notes.  Pertinent labs & imaging results that were available during my care of the patient were reviewed by me and considered in my medical decision making (see chart for details).   Presents to the emergency department with right lower extremity pain/swelling, most prominent to the posterior knee extending into the calf.  Nontoxic-appearing, no apparent distress, vitals WNL with exception of elevated blood pressure, doubt HTN emergency.  An x-ray of her knee was performed per triage protocol and  was negative for fracture or dislocation, she does have tricompartmental degenerative spurring.  She has good distal pulses, lower extremities are warm and symmetric in temperature, no neuro deficit, doubt acute arterial emboli. Venous duplex performed and negative for DVT/SVT.  Ultrasound however does show findings of venous varicosities which were visualized on exam, compatible with underlying venous insufficiency. NVI distally. Discussed importance of compression/elevation, will trial diclofenac gel as well, PCP Follow up. I discussed results, treatment plan, need for follow-up, and return precautions with the patient. Provided opportunity for questions, patient confirmed understanding and is in agreement with plan.   Final Clinical Impressions(s) / ED Diagnoses   Final diagnoses:  Varicose veins of right lower extremity with pain    ED Discharge Orders         Ordered    diclofenac sodium (VOLTAREN) 1 % GEL  4 times daily     01/28/18 [address], Glynda Jaeger, PA-C 01/28/18 [place], Kevin, MD 01/29/18 [phone]

## 2018-01-28 NOTE — ED Notes (Signed)
ED Provider at bedside. 

## 2018-01-28 NOTE — Discharge Instructions (Addendum)
You are seen in the emergency department today for leg pain/swelling.  Your x-ray did not show fracture or dislocation, it did show some osteoarthritis or degenerative changes in the knee.  Your ultrasound did not show a blood clot, it did show that you have a varicose veins, please see the attached handout, for this we recommend using compression stockings which can be purchased at the drugstore.  We also recommend keeping the leg elevated as best possible.  We are sending you home with diclofenac gel, and anti-inflammatory gel to also try to assist with the discomfort, please use as prescribed for the next few days to see if it helps.   We have prescribed you new medication(s) today. Discuss the medications prescribed today with your pharmacist as they can have adverse effects and interactions with your other medicines including over the counter and prescribed medications. Seek medical evaluation if you start to experience new or abnormal symptoms after taking one of these medicines, seek care immediately if you start to experience difficulty breathing, feeling of your throat closing, facial swelling, or rash as these could be indications of a more serious allergic reaction  Please follow-up with primary care in 3 to 5 days for reevaluation.  Return to the ER for new or worsening symptoms or any other concerns.

## 2018-02-10 ENCOUNTER — Ambulatory Visit: Payer: BLUE CROSS/BLUE SHIELD | Admitting: Podiatry

## 2018-02-10 DIAGNOSIS — E1165 Type 2 diabetes mellitus with hyperglycemia: Secondary | ICD-10-CM | POA: Diagnosis not present

## 2018-03-18 DIAGNOSIS — N189 Chronic kidney disease, unspecified: Secondary | ICD-10-CM | POA: Diagnosis not present

## 2018-03-18 DIAGNOSIS — D631 Anemia in chronic kidney disease: Secondary | ICD-10-CM | POA: Diagnosis not present

## 2018-03-18 DIAGNOSIS — N2581 Secondary hyperparathyroidism of renal origin: Secondary | ICD-10-CM | POA: Diagnosis not present

## 2018-03-18 DIAGNOSIS — N183 Chronic kidney disease, stage 3 (moderate): Secondary | ICD-10-CM | POA: Diagnosis not present

## 2018-03-18 LAB — BASIC METABOLIC PANEL
BUN: 11 (ref 4–21)
Creatinine: 0.9 (ref 0.5–1.1)
Glucose: 104
Potassium: 4.4 (ref 3.4–5.3)
Sodium: 143 (ref 137–147)

## 2018-03-24 ENCOUNTER — Encounter: Payer: Self-pay | Admitting: Internal Medicine

## 2018-04-05 ENCOUNTER — Encounter: Payer: Self-pay | Admitting: Internal Medicine

## 2018-04-06 ENCOUNTER — Other Ambulatory Visit: Payer: Self-pay | Admitting: Internal Medicine

## 2018-04-06 DIAGNOSIS — E118 Type 2 diabetes mellitus with unspecified complications: Secondary | ICD-10-CM

## 2018-05-26 ENCOUNTER — Other Ambulatory Visit: Payer: Self-pay | Admitting: Internal Medicine

## 2018-05-26 ENCOUNTER — Encounter: Payer: Self-pay | Admitting: Internal Medicine

## 2018-05-26 DIAGNOSIS — M170 Bilateral primary osteoarthritis of knee: Secondary | ICD-10-CM

## 2018-05-26 MED ORDER — DICLOFENAC SODIUM 1 % TD GEL: 2.0000 g | Freq: Four times a day (QID) | TRANSDERMAL | 2 refills | Status: DC

## 2018-06-18 DIAGNOSIS — Z6833 Body mass index (BMI) 33.0-33.9, adult: Secondary | ICD-10-CM | POA: Diagnosis not present

## 2018-06-18 DIAGNOSIS — R319 Hematuria, unspecified: Secondary | ICD-10-CM | POA: Diagnosis not present

## 2018-06-18 DIAGNOSIS — Z01419 Encounter for gynecological examination (general) (routine) without abnormal findings: Secondary | ICD-10-CM | POA: Diagnosis not present

## 2018-06-24 DIAGNOSIS — N950 Postmenopausal bleeding: Secondary | ICD-10-CM | POA: Diagnosis not present

## 2018-06-30 ENCOUNTER — Other Ambulatory Visit: Payer: Self-pay | Admitting: Internal Medicine

## 2018-06-30 DIAGNOSIS — E118 Type 2 diabetes mellitus with unspecified complications: Secondary | ICD-10-CM

## 2018-07-09 DIAGNOSIS — N183 Chronic kidney disease, stage 3 (moderate): Secondary | ICD-10-CM | POA: Diagnosis not present

## 2018-07-09 DIAGNOSIS — D631 Anemia in chronic kidney disease: Secondary | ICD-10-CM | POA: Diagnosis not present

## 2018-07-09 DIAGNOSIS — N2581 Secondary hyperparathyroidism of renal origin: Secondary | ICD-10-CM | POA: Diagnosis not present

## 2018-07-21 ENCOUNTER — Encounter: Payer: Self-pay | Admitting: Internal Medicine

## 2018-07-21 ENCOUNTER — Ambulatory Visit (INDEPENDENT_AMBULATORY_CARE_PROVIDER_SITE_OTHER)
Admission: RE | Admit: 2018-07-21 | Discharge: 2018-07-21 | Disposition: A | Discharge status: Home / Self Care | Payer: BC Managed Care – PPO | Source: Ambulatory Visit | Attending: Internal Medicine | Admitting: Internal Medicine

## 2018-07-21 ENCOUNTER — Other Ambulatory Visit (INDEPENDENT_AMBULATORY_CARE_PROVIDER_SITE_OTHER): Payer: BC Managed Care – PPO

## 2018-07-21 ENCOUNTER — Other Ambulatory Visit: Payer: Self-pay

## 2018-07-21 ENCOUNTER — Ambulatory Visit (INDEPENDENT_AMBULATORY_CARE_PROVIDER_SITE_OTHER): Payer: BC Managed Care – PPO | Admitting: Internal Medicine

## 2018-07-21 VITALS — BP 146/78 | HR 68 | Temp 98.5°F | Resp 16 | Ht 59.0 in | Wt 171.0 lb

## 2018-07-21 DIAGNOSIS — E118 Type 2 diabetes mellitus with unspecified complications: Secondary | ICD-10-CM | POA: Diagnosis not present

## 2018-07-21 DIAGNOSIS — K760 Fatty (change of) liver, not elsewhere classified: Secondary | ICD-10-CM

## 2018-07-21 DIAGNOSIS — S298XXA Other specified injuries of thorax, initial encounter: Secondary | ICD-10-CM

## 2018-07-21 DIAGNOSIS — R0789 Other chest pain: Secondary | ICD-10-CM | POA: Diagnosis not present

## 2018-07-21 DIAGNOSIS — Z0000 Encounter for general adult medical examination without abnormal findings: Secondary | ICD-10-CM

## 2018-07-21 DIAGNOSIS — R3121 Asymptomatic microscopic hematuria: Secondary | ICD-10-CM | POA: Diagnosis not present

## 2018-07-21 DIAGNOSIS — E785 Hyperlipidemia, unspecified: Secondary | ICD-10-CM | POA: Insufficient documentation

## 2018-07-21 DIAGNOSIS — N183 Chronic kidney disease, stage 3 unspecified: Secondary | ICD-10-CM

## 2018-07-21 DIAGNOSIS — D892 Hypergammaglobulinemia, unspecified: Secondary | ICD-10-CM

## 2018-07-21 DIAGNOSIS — I10 Essential (primary) hypertension: Secondary | ICD-10-CM

## 2018-07-21 LAB — CBC WITH DIFFERENTIAL/PLATELET
Basophils Absolute: 0 10*3/uL (ref 0.0–0.1)
Basophils Relative: 0.6 % (ref 0.0–3.0)
Eosinophils Absolute: 0.2 10*3/uL (ref 0.0–0.7)
Eosinophils Relative: 4.3 % (ref 0.0–5.0)
HCT: 40.1 % (ref 36.0–46.0)
Hemoglobin: 12.8 g/dL (ref 12.0–15.0)
Lymphocytes Relative: 41.4 % (ref 12.0–46.0)
Lymphs Abs: 2.1 10*3/uL (ref 0.7–4.0)
MCHC: 31.9 g/dL (ref 30.0–36.0)
MCV: 88.4 fl (ref 78.0–100.0)
Monocytes Absolute: 0.4 10*3/uL (ref 0.1–1.0)
Monocytes Relative: 8.6 % (ref 3.0–12.0)
Neutro Abs: 2.3 10*3/uL (ref 1.4–7.7)
Neutrophils Relative %: 45.1 % (ref 43.0–77.0)
Platelets: 209 10*3/uL (ref 150.0–400.0)
RBC: 4.54 Mil/uL (ref 3.87–5.11)
RDW: 14.2 % (ref 11.5–15.5)
WBC: 5.1 10*3/uL (ref 4.0–10.5)

## 2018-07-21 LAB — POC URINALSYSI DIPSTICK (AUTOMATED)
Bilirubin, UA: NEGATIVE
Blood, UA: POSITIVE
Glucose, UA: NEGATIVE
Ketones, UA: NEGATIVE
Leukocytes, UA: NEGATIVE
Nitrite, UA: NEGATIVE
Protein, UA: NEGATIVE
Spec Grav, UA: 1.02 (ref 1.010–1.025)
Urobilinogen, UA: 0.2 E.U./dL
pH, UA: 5.5 (ref 5.0–8.0)

## 2018-07-21 LAB — LIPID PANEL
Cholesterol: 109 mg/dL (ref 0–200)
HDL: 66.8 mg/dL (ref >39.00)
LDL Cholesterol: 35 mg/dL (ref 0–99)
NonHDL: 42.41
Total CHOL/HDL Ratio: 2
Triglycerides: 36 mg/dL (ref 0.0–149.0)
VLDL: 7.2 mg/dL (ref 0.0–40.0)

## 2018-07-21 LAB — POCT GLYCOSYLATED HEMOGLOBIN (HGB A1C): Hemoglobin A1C: 6.5 % — AB (ref 4.0–5.6)

## 2018-07-21 LAB — TSH: TSH: 1.72 u[IU]/mL (ref 0.35–4.50)

## 2018-07-21 LAB — HEPATIC FUNCTION PANEL
ALT: 15 U/L (ref 0–35)
AST: 19 U/L (ref 0–37)
Albumin: 4 g/dL (ref 3.5–5.2)
Alkaline Phosphatase: 75 U/L (ref 39–117)
Bilirubin, Direct: 0.1 mg/dL (ref 0.0–0.3)
Total Bilirubin: 0.3 mg/dL (ref 0.2–1.2)
Total Protein: 8.5 g/dL — ABNORMAL HIGH (ref 6.0–8.3)

## 2018-07-21 LAB — POCT UA - MICROALBUMIN
Albumin/Creatinine Ratio, Urine, POC: 3:300 {titer}
Creatinine, POC: <100 mg/dL
Microalbumin Ur, POC: <30 mg/L

## 2018-07-21 MED ORDER — FARXIGA 10 MG PO TABS: 10.0000 mg | ORAL_TABLET | Freq: Every day | ORAL | 1 refills | Status: DC

## 2018-07-21 MED ORDER — LIVALO 1 MG PO TABS: 1.0000 | ORAL_TABLET | Freq: Every day | ORAL | 1 refills | Status: DC

## 2018-07-21 NOTE — Progress Notes (Signed)
Subjective:  Patient ID: Angel Gordon, female    DOB: [birth year]  Age: 63 y.o. MRN: [id number]  CC: Annual Exam, Hypertension, Diabetes, and Hyperlipidemia   HPI Angel Gordon presents for a CPX.  She complains that she fell 3 weeks ago and injured her left lateral rib cage.  She continues to have discomfort in the area.  She is using topical diclofenac to treat the discomfort.  She denies shortness of breath, hemoptysis, palpitations, dizziness, lightheadedness, or near syncope.  She is also concerned that her blood pressure has not been well controlled.  She complains of weight gain but denies DOE, palpitations, edema, or fatigue.  She has a history of paraproteinemia and renal insufficiency.  She saw her nephrologist a few months ago and her total protein level was normal at 7.8 and her renal function has improved.  She has chronic asymptomatic hematuria that has been evaluated by urology, nephrology, and gynecology.  Outpatient Medications Prior to Visit  Medication Sig Dispense Refill  . Continuous Blood Gluc Receiver (FREESTYLE LIBRE READER) DEVI 1 Act by Does not apply route 2 (two) times daily. 1 Device 1  . Continuous Blood Gluc Sensor (FREESTYLE LIBRE 14 DAY SENSOR) MISC USE AS DIRECTED 2 each 2  . Continuous Blood Gluc Sensor (FREESTYLE LIBRE 14 DAY SENSOR) MISC USE AS DIRECTED 3 each 1  . Continuous Blood Gluc Sensor (FREESTYLE LIBRE SENSOR SYSTEM) MISC Use as instructed. 6 each 1  . metFORMIN (GLUCOPHAGE) 500 MG tablet Take 1 tablet (500 mg total) by mouth 2 (two) times daily. 180 tablet 1  . dapagliflozin propanediol (FARXIGA) 5 MG TABS tablet Take 5 mg by mouth daily. 90 tablet 1  . esomeprazole (NEXIUM) 40 MG capsule Take 1 capsule (40 mg total) by mouth daily at 12 noon. 90 capsule 3  . meloxicam (MOBIC) 15 MG tablet Take 1 tablet (15 mg total) by mouth daily. 20 tablet 0  . triamcinolone (NASACORT AQ) 55 MCG/ACT AERO nasal inhaler Place 2 sprays into the nose  daily. 32.4 mL 3  . diclofenac sodium (VOLTAREN) 1 % GEL Apply 2 g topically 4 (four) times daily. (Patient not taking: Reported on 07/21/2018) 100 g 2   No facility-administered medications prior to visit.     ROS Review of Systems  Constitutional: Positive for unexpected weight change (wt gain). Negative for chills, diaphoresis, fatigue and fever.  HENT: Negative.  Negative for trouble swallowing.   Eyes: Negative for visual disturbance.  Respiratory: Negative for cough, chest tightness, shortness of breath and wheezing.   Cardiovascular: Positive for chest pain. Negative for palpitations and leg swelling.  Gastrointestinal: Negative for abdominal pain, constipation, diarrhea, nausea and vomiting.  Endocrine: Negative.   Genitourinary: Negative.  Negative for difficulty urinating, frequency, hematuria, pelvic pain and urgency.  Musculoskeletal: Negative.  Negative for arthralgias and myalgias.  Skin: Negative.   Neurological: Negative.  Negative for dizziness, weakness and light-headedness.  Hematological: Negative for adenopathy. Does not bruise/bleed easily.  Psychiatric/Behavioral: Negative.     Objective:  BP (!) 146/78 (BP Location: Left Arm, Patient Position: Sitting, Cuff Size: Normal)   Pulse 68   Temp 98.5 F (36.9 C) (Oral)   Resp 16   Ht 4\' 11"  (1.499 m)   Wt 171 lb (77.6 kg)   SpO2 99%   BMI 34.54 kg/m   BP Readings from Last 3 Encounters:  07/21/18 (!) 146/78  01/28/18 123/75  12/16/17 130/76    Wt Readings from Last 3 Encounters:  07/21/18  171 lb (77.6 kg)  01/28/18 165 lb (74.8 kg)  12/16/17 167 lb (75.8 kg)    Physical Exam Vitals signs reviewed.  Constitutional:      Appearance: She is obese. She is not ill-appearing or diaphoretic.  HENT:     Nose: Nose normal.     Mouth/Throat:     Mouth: Mucous membranes are moist.     Pharynx: No oropharyngeal exudate or posterior oropharyngeal erythema.  Eyes:     General: No scleral icterus.     Conjunctiva/sclera: Conjunctivae normal.  Neck:     Musculoskeletal: Normal range of motion and neck supple. No neck rigidity or muscular tenderness.  Cardiovascular:     Rate and Rhythm: Normal rate and regular rhythm.     Heart sounds: No murmur. No gallop.   Pulmonary:     Effort: Pulmonary effort is normal.     Breath sounds: No stridor. No wheezing, rhonchi or rales.  Chest:     Chest wall: No mass, lacerations, deformity, swelling, tenderness, crepitus or edema.  Abdominal:     General: Abdomen is protuberant. Bowel sounds are normal. There is no distension.     Palpations: There is no hepatomegaly or splenomegaly.     Tenderness: There is no abdominal tenderness.  Musculoskeletal: Normal range of motion.     Right lower leg: No edema.     Left lower leg: No edema.  Lymphadenopathy:     Cervical: No cervical adenopathy.  Skin:    General: Skin is warm and dry.     Coloration: Skin is not pale.     Findings: No erythema.  Neurological:     General: No focal deficit present.     Mental Status: She is alert.  Psychiatric:        Mood and Affect: Mood normal.        Behavior: Behavior normal.     Lab Results  Component Value Date   WBC 5.1 07/21/2018   HGB 12.8 07/21/2018   HCT 40.1 07/21/2018   PLT 209.0 07/21/2018   GLUCOSE 102 (H) 12/16/2017   CHOL 109 07/21/2018   TRIG 36.0 07/21/2018   HDL 66.80 07/21/2018   LDLCALC 35 07/21/2018   ALT 15 07/21/2018   AST 19 07/21/2018   NA 143 03/18/2018   K 4.4 03/18/2018   CL 102 12/16/2017   CREATININE 0.9 03/18/2018   BUN 11 03/18/2018   CO2 29 12/16/2017   TSH 1.72 07/21/2018   HGBA1C 6.5 (A) 07/21/2018   MICROALBUR <30 07/21/2018    US Venous Img Lower Right (dvt Study)  Result Date: 01/28/2018 CLINICAL DATA:  63 year old with right leg pain and swelling. EXAM: RIGHT LOWER EXTREMITY VENOUS DOPPLER ULTRASOUND TECHNIQUE: Gray-scale sonography with graded compression, as well as color Doppler and duplex ultrasound  were performed to evaluate the lower extremity deep venous systems from the level of the common femoral vein and including the common femoral, femoral, profunda femoral, popliteal and calf veins including the posterior tibial, peroneal and gastrocnemius veins when visible. The superficial great saphenous vein was also interrogated. Spectral Doppler was utilized to evaluate flow at rest and with distal augmentation maneuvers in the common femoral, femoral and popliteal veins. COMPARISON:  None. FINDINGS: Contralateral Common Femoral Vein: Respiratory phasicity is normal and symmetric with the symptomatic side. No evidence of thrombus. Normal compressibility. Common Femoral Vein: No evidence of thrombus. Normal compressibility, respiratory phasicity and response to augmentation. Saphenofemoral Junction: No evidence of thrombus. Normal compressibility and flow  on color Doppler imaging. Profunda Femoral Vein: No evidence of thrombus. Normal compressibility and flow on color Doppler imaging. Femoral Vein: No evidence of thrombus. Normal compressibility, respiratory phasicity and response to augmentation. Popliteal Vein: No evidence of thrombus. Normal compressibility, respiratory phasicity and response to augmentation. Calf Veins: No evidence of thrombus. Normal compressibility and flow on color Doppler imaging. Superficial Great Saphenous Vein: No evidence of thrombus. Normal compressibility. Other Findings: Visualized short saphenous vein is compressible without thrombus. Compressible varicosities in the right calf. IMPRESSION: Negative for deep venous thrombosis in right lower extremity. Negative for superficial venous thrombosis. However, there are varicose veins in the right calf and findings are compatible with underlying venous insufficiency. Venous insufficiency and varicosities could be related to the patient's symptoms. Electronically Signed   By: Markus Daft M.D.   On: 01/28/2018 16:23   Dg Knee Complete 4  Views Right  Result Date: 01/28/2018 CLINICAL DATA:  Pain without trauma EXAM: RIGHT KNEE - COMPLETE 4+ VIEW COMPARISON:  None. FINDINGS: Marginal spurs about the lateral compartment and from the medial tibial plateau as well as from the patellar articular surface. No fracture or dislocation. No effusion. Normal alignment and mineralization. IMPRESSION: 1. Negative for fracture or other acute bone abnormality. 2. Tricompartmental degenerative spurring, most marked laterally. Electronically Signed   By: Lucrezia Europe M.D.   On: 01/28/2018 14:58   Dg Chest 2 View  Result Date: 07/21/2018 CLINICAL DATA:  Chest wall pain status post fall 3 weeks ago EXAM: CHEST - 2 VIEW COMPARISON:  07/27/2016 FINDINGS: The heart size and mediastinal contours are within normal limits. Both lungs are clear. The visualized skeletal structures are unremarkable. IMPRESSION: No active cardiopulmonary disease. Electronically Signed   By: Kathreen Devoid   On: 07/21/2018 10:17     Assessment & Plan:   Naveen was seen today for annual exam, hypertension, diabetes and hyperlipidemia.  Diagnoses and all orders for this visit:  Chronic renal disease, stage 3, moderately decreased glomerular filtration rate (GFR) between 30-59 mL/min/1.73 square meter ([place])- Her renal function has improved.  She agrees to avoid systemic NSAIDs.  I will also work to get good control of her blood sugar and her blood pressure. -     CBC with Differential/Platelet; Future -     Cancel: Basic metabolic panel; Future -     Cancel: Urinalysis, Routine w reflex microscopic; Future -     Cancel: Microalbumin / creatinine urine ratio; Future  Type II diabetes mellitus with manifestations ([place])- Her A1c is at 6.5%.  Her blood sugars are adequately well controlled.  I have asked her to add an SGLT2 inhibitor for cardiovascular risk reduction. -     Cancel: Hemoglobin A1c; Future -     HM Diabetes Foot Exam -     Discontinue: dapagliflozin propanediol  (FARXIGA) 10 MG TABS tablet; Take 10 mg by mouth daily. -     dapagliflozin propanediol (FARXIGA) 10 MG TABS tablet; Take 10 mg by mouth daily.  Fatty liver disease, nonalcoholic- Her LFTs are normal now. -     Hepatic function panel; Future  Paraproteinemia- She had an SPEP done by nephrology about 3 months ago that was unremarkable.  Will continue to monitor this.  Routine health maintenance- Exam completed, labs reviewed, she refused a Tdap and pneumonia vaccine today, colon cancer screening/Pap/mammogram are all up-to-date, patient education material was given. -     Lipid panel; Future  Hyperlipidemia with target LDL less than 100 -have asked her  to start taking a statin for CV risk reduction. -     TSH; Future -     Pitavastatin Calcium (LIVALO) 1 MG TABS; Take 1 tablet (1 mg total) by mouth daily.  Type 2 diabetes mellitus with complication, without long-term current use of insulin ([facility]) -     Discontinue: dapagliflozin propanediol (FARXIGA) 10 MG TABS tablet; Take 10 mg by mouth daily. -     POCT glycosylated hemoglobin (Hb A1C) -     POCT UA - Microalbumin -     dapagliflozin propanediol (FARXIGA) 10 MG TABS tablet; Take 10 mg by mouth daily.  Blunt trauma to chest, initial encounter -     DG Chest 2 View; Future  Left-sided chest wall pain- Based on her symptoms, normal exam, and normal chest x-ray this is consistent with a contusion.  She will continue using the topical NSAID for symptom relief. -     DG Chest 2 View; Future  Asymptomatic microscopic hematuria -     POCT Urinalysis Dipstick (Automated)   I have discontinued Angel Gordon's esomeprazole, triamcinolone, meloxicam, and dapagliflozin propanediol. I am also having her start on Livalo. Additionally, I am having her maintain her FreeStyle Libre Reader, metFORMIN, FreeStyle [employer], diclofenac sodium, FreeStyle Libre 14 Day Sensor, FreeStyle [place] 14 Day Sensor, and [place].  Meds ordered this  encounter  Medications  . DISCONTD: dapagliflozin propanediol (FARXIGA) 10 MG TABS tablet    Sig: Take 10 mg by mouth daily.    Dispense:  90 tablet    Refill:  1  . dapagliflozin propanediol (FARXIGA) 10 MG TABS tablet    Sig: Take 10 mg by mouth daily.    Dispense:  90 tablet    Refill:  1  . Pitavastatin Calcium (LIVALO) 1 MG TABS    Sig: Take 1 tablet (1 mg total) by mouth daily.    Dispense:  90 tablet    Refill:  1     Follow-up: Return in about 6 months (around 01/20/2019).  Scarlette Calico, MD

## 2018-07-21 NOTE — Patient Instructions (Signed)
Type 2 Diabetes Mellitus, Diagnosis, Adult Type 2 diabetes (type 2 diabetes mellitus) is a long-term (chronic) disease. In type 2 diabetes, one or both of these problems may be present:  The pancreas does not make enough of a hormone called insulin.  Cells in the body do not respond properly to insulin that the body makes (insulin resistance). Normally, insulin allows blood sugar (glucose) to enter cells in the body. The cells use glucose for energy. Insulin resistance or lack of insulin causes excess glucose to build up in the blood instead of going into cells. As a result, high blood glucose (hyperglycemia) develops. What increases the risk? The following factors may make you more likely to develop type 2 diabetes:  Having a family member with type 2 diabetes.  Being overweight or obese.  Having an inactive (sedentary) lifestyle.  Having been diagnosed with insulin resistance.  Having a history of prediabetes, gestational diabetes, or polycystic ovary syndrome (PCOS).  Being of American-Indian, African-American, Hispanic/Latino, or Asian/Pacific Islander descent. What are the signs or symptoms? In the early stage of this condition, you may not have symptoms. Symptoms develop slowly and may include:  Increased thirst (polydipsia).  Increased hunger(polyphagia).  Increased urination (polyuria).  Increased urination during the night (nocturia).  Unexplained weight loss.  Frequent infections that keep coming back (recurring).  Fatigue.  Weakness.  Vision changes, such as blurry vision.  Cuts or bruises that are slow to heal.  Tingling or numbness in the hands or feet.  Dark patches on the skin (acanthosis nigricans). How is this diagnosed? This condition is diagnosed based on your symptoms, your medical history, a physical exam, and your blood glucose level. Your blood glucose may be checked with one or more of the following blood tests:  A fasting blood glucose (FBG)  test. You will not be allowed to eat (you will fast) for 8 hours or longer before a blood sample is taken.  A random blood glucose test. This test checks blood glucose at any time of day regardless of when you ate.  An A1c (hemoglobin A1c) blood test. This test provides information about blood glucose control over the previous 2-3 months.  An oral glucose tolerance test (OGTT). This test measures your blood glucose at two times: ? After fasting. This is your baseline blood glucose level. ? Two hours after drinking a beverage that contains glucose. You may be diagnosed with type 2 diabetes if:  Your FBG level is 126 mg/dL (7.0 mmol/L) or higher.  Your random blood glucose level is 200 mg/dL (11.1 mmol/L) or higher.  Your A1c level is 6.5% or higher.  Your OGTT result is higher than 200 mg/dL (11.1 mmol/L). These blood tests may be repeated to confirm your diagnosis. How is this treated? Your treatment may be managed by a specialist called an endocrinologist. Type 2 diabetes may be treated by following instructions from your health care provider about:  Making diet and lifestyle changes. This may include: ? Following an individualized nutrition plan that is developed by a diet and nutrition specialist (registered dietitian). ? Exercising regularly. ? Finding ways to manage stress.  Checking your blood glucose level as often as told.  Taking diabetes medicines or insulin daily. This helps to keep your blood glucose levels in the healthy range. ? If you use insulin, you may need to adjust the dosage depending on how physically active you are and what foods you eat. Your health care provider will tell you how to adjust your dosage.    Taking medicines to help prevent complications from diabetes, such as: ? Aspirin. ? Medicine to lower cholesterol. ? Medicine to control blood pressure. Your health care provider will set individualized treatment goals for you. Your goals will be based on  your age, other medical conditions you have, and how you respond to diabetes treatment. Generally, the goal of treatment is to maintain the following blood glucose levels:  Before meals (preprandial): 80-130 mg/dL (4.4-7.2 mmol/L).  After meals (postprandial): below 180 mg/dL (10 mmol/L).  A1c level: less than 7%. Follow these instructions at home: Questions to ask your health care provider  Consider asking the following questions: ? Do I need to meet with a diabetes educator? ? Where can I find a support group for people with diabetes? ? What equipment will I need to manage my diabetes at home? ? What diabetes medicines do I need, and when should I take them? ? How often do I need to check my blood glucose? ? What number can I call if I have questions? ? When is my next appointment? General instructions  Take over-the-counter and prescription medicines only as told by your health care provider.  Keep all follow-up visits as told by your health care provider. This is important.  For more information about diabetes, visit: ? American Diabetes Association (ADA): www.diabetes.org ? American Association of Diabetes Educators (AADE): www.diabeteseducator.org Contact a health care provider if:  Your blood glucose is at or above 240 mg/dL (13.3 mmol/L) for 2 days in a row.  You have been sick or have had a fever for 2 days or longer, and you are not getting better.  You have any of the following problems for more than 6 hours: ? You cannot eat or drink. ? You have nausea and vomiting. ? You have diarrhea. Get help right away if:  Your blood glucose is lower than 54 mg/dL (3.0 mmol/L).  You become confused or you have trouble thinking clearly.  You have difficulty breathing.  You have moderate or large ketone levels in your urine. Summary  Type 2 diabetes (type 2 diabetes mellitus) is a long-term (chronic) disease. In type 2 diabetes, the pancreas does not make enough of a  hormone called insulin, or cells in the body do not respond properly to insulin that the body makes (insulin resistance).  This condition is treated by making diet and lifestyle changes and taking diabetes medicines or insulin.  Your health care provider will set individualized treatment goals for you. Your goals will be based on your age, other medical conditions you have, and how you respond to diabetes treatment.  Keep all follow-up visits as told by your health care provider. This is important. This information is not intended to replace advice given to you by your health care provider. Make sure you discuss any questions you have with your health care provider. Document Released: 01/08/2005 Document Revised: 03/08/2017 Document Reviewed: 02/11/2015 Elsevier Patient Education  2020 Else[employer] Inc.  

## 2018-07-22 ENCOUNTER — Telehealth: Payer: Self-pay

## 2018-07-22 ENCOUNTER — Other Ambulatory Visit: Payer: Self-pay | Admitting: Internal Medicine

## 2018-07-22 DIAGNOSIS — I10 Essential (primary) hypertension: Secondary | ICD-10-CM | POA: Insufficient documentation

## 2018-07-22 DIAGNOSIS — E785 Hyperlipidemia, unspecified: Secondary | ICD-10-CM

## 2018-07-22 MED ORDER — ATORVASTATIN CALCIUM 10 MG PO TABS: 10.0000 mg | ORAL_TABLET | Freq: Every day | ORAL | 1 refills | Status: DC

## 2018-07-22 NOTE — Telephone Encounter (Signed)
Livalo - Key: Marcellina Millin - Key: [id number]

## 2018-07-22 NOTE — Assessment & Plan Note (Signed)
Her blood pressure is not adequately well controlled. She will start taking an SGLT2 inhibitor and I anticipate this will help lower her blood pressure.

## 2018-07-23 ENCOUNTER — Encounter: Payer: Self-pay | Admitting: Internal Medicine

## 2018-08-04 ENCOUNTER — Encounter: Payer: Self-pay | Admitting: Internal Medicine

## 2018-08-06 LAB — HM MAMMOGRAPHY: HM Mammogram: NORMAL (ref 0–4)

## 2018-08-11 DIAGNOSIS — Z1231 Encounter for screening mammogram for malignant neoplasm of breast: Secondary | ICD-10-CM | POA: Diagnosis not present

## 2018-09-05 ENCOUNTER — Telehealth: Payer: Self-pay

## 2018-09-05 NOTE — Telephone Encounter (Signed)
Key: [id number]

## 2018-09-06 DIAGNOSIS — Z20828 Contact with and (suspected) exposure to other viral communicable diseases: Secondary | ICD-10-CM | POA: Diagnosis not present

## 2018-09-09 ENCOUNTER — Other Ambulatory Visit: Payer: Self-pay | Admitting: Internal Medicine

## 2018-12-08 ENCOUNTER — Other Ambulatory Visit: Payer: Self-pay | Admitting: Internal Medicine

## 2018-12-08 DIAGNOSIS — E118 Type 2 diabetes mellitus with unspecified complications: Secondary | ICD-10-CM

## 2018-12-22 ENCOUNTER — Other Ambulatory Visit: Payer: Self-pay | Admitting: Internal Medicine

## 2018-12-22 DIAGNOSIS — E118 Type 2 diabetes mellitus with unspecified complications: Secondary | ICD-10-CM

## 2018-12-22 DIAGNOSIS — E785 Hyperlipidemia, unspecified: Secondary | ICD-10-CM

## 2018-12-27 ENCOUNTER — Other Ambulatory Visit: Payer: Self-pay

## 2018-12-27 DIAGNOSIS — Z20822 Contact with and (suspected) exposure to covid-19: Secondary | ICD-10-CM

## 2018-12-30 LAB — NOVEL CORONAVIRUS, NAA: SARS-CoV-2, NAA: NOT DETECTED

## 2019-01-21 ENCOUNTER — Other Ambulatory Visit: Payer: Self-pay | Admitting: Cardiology

## 2019-01-21 DIAGNOSIS — Z20822 Contact with and (suspected) exposure to covid-19: Secondary | ICD-10-CM

## 2019-01-21 DIAGNOSIS — Z20828 Contact with and (suspected) exposure to other viral communicable diseases: Secondary | ICD-10-CM | POA: Diagnosis not present

## 2019-01-22 LAB — NOVEL CORONAVIRUS, NAA: SARS-CoV-2, NAA: NOT DETECTED

## 2019-02-04 ENCOUNTER — Other Ambulatory Visit: Payer: Self-pay

## 2019-02-04 ENCOUNTER — Other Ambulatory Visit: Payer: Self-pay | Admitting: Internal Medicine

## 2019-02-04 DIAGNOSIS — E785 Hyperlipidemia, unspecified: Secondary | ICD-10-CM

## 2019-02-04 MED ORDER — ATORVASTATIN CALCIUM 10 MG PO TABS: 10.0000 mg | ORAL_TABLET | Freq: Every day | ORAL | 1 refills | Status: DC

## 2019-02-04 NOTE — Telephone Encounter (Signed)
[facility] sent refill rq for atorvastatin. Per Dr. Ronnald Ramp, pt is due for an appointment.   Pt has an appt scheduled for 02/09/2019. Okay to refill as requested?

## 2019-02-04 NOTE — Telephone Encounter (Signed)
done

## 2019-02-09 ENCOUNTER — Encounter: Payer: Self-pay | Admitting: Internal Medicine

## 2019-02-09 ENCOUNTER — Ambulatory Visit: Payer: BC Managed Care – PPO | Admitting: Internal Medicine

## 2019-02-09 ENCOUNTER — Other Ambulatory Visit: Payer: Self-pay

## 2019-02-09 VITALS — BP 128/80 | HR 84 | Temp 97.9°F | Resp 16 | Ht 59.0 in | Wt 164.0 lb

## 2019-02-09 DIAGNOSIS — R3121 Asymptomatic microscopic hematuria: Secondary | ICD-10-CM

## 2019-02-09 DIAGNOSIS — E118 Type 2 diabetes mellitus with unspecified complications: Secondary | ICD-10-CM

## 2019-02-09 DIAGNOSIS — I10 Essential (primary) hypertension: Secondary | ICD-10-CM

## 2019-02-09 LAB — URINALYSIS, ROUTINE W REFLEX MICROSCOPIC
Bilirubin Urine: NEGATIVE
Leukocytes,Ua: NEGATIVE
Nitrite: NEGATIVE
Specific Gravity, Urine: >=1.03 — AB (ref 1.000–1.030)
Total Protein, Urine: NEGATIVE
Urine Glucose: >=1000 — AB
Urobilinogen, UA: 0.2 (ref 0.0–1.0)
pH: 5 (ref 5.0–8.0)

## 2019-02-09 LAB — CBC WITH DIFFERENTIAL/PLATELET
Basophils Absolute: 0 10*3/uL (ref 0.0–0.1)
Basophils Relative: 1 % (ref 0.0–3.0)
Eosinophils Absolute: 0.4 10*3/uL (ref 0.0–0.7)
Eosinophils Relative: 8.2 % — ABNORMAL HIGH (ref 0.0–5.0)
HCT: 43 % (ref 36.0–46.0)
Hemoglobin: 13.8 g/dL (ref 12.0–15.0)
Lymphocytes Relative: 50 % — ABNORMAL HIGH (ref 12.0–46.0)
Lymphs Abs: 2.4 10*3/uL (ref 0.7–4.0)
MCHC: 32 g/dL (ref 30.0–36.0)
MCV: 87.6 fl (ref 78.0–100.0)
Monocytes Absolute: 0.5 10*3/uL (ref 0.1–1.0)
Monocytes Relative: 10.1 % (ref 3.0–12.0)
Neutro Abs: 1.5 10*3/uL (ref 1.4–7.7)
Neutrophils Relative %: 30.7 % — ABNORMAL LOW (ref 43.0–77.0)
Platelets: 209 10*3/uL (ref 150.0–400.0)
RBC: 4.91 Mil/uL (ref 3.87–5.11)
RDW: 14 % (ref 11.5–15.5)
WBC: 4.9 10*3/uL (ref 4.0–10.5)

## 2019-02-09 LAB — HEMOGLOBIN A1C: Hgb A1c MFr Bld: 6.9 % — ABNORMAL HIGH (ref 4.6–6.5)

## 2019-02-09 LAB — BASIC METABOLIC PANEL
BUN: 17 mg/dL (ref 6–23)
CO2: 26 mEq/L (ref 19–32)
Calcium: 10 mg/dL (ref 8.4–10.5)
Chloride: 102 mEq/L (ref 96–112)
Creatinine, Ser: 1.07 mg/dL (ref 0.40–1.20)
GFR: 63.05 mL/min (ref >60.00)
Glucose, Bld: 85 mg/dL (ref 70–99)
Potassium: 4.4 mEq/L (ref 3.5–5.1)
Sodium: 137 mEq/L (ref 135–145)

## 2019-02-09 MED ORDER — METFORMIN HCL 500 MG PO TABS: 500.0000 mg | ORAL_TABLET | Freq: Two times a day (BID) | ORAL | 1 refills | Status: DC

## 2019-02-09 NOTE — Progress Notes (Signed)
Subjective:  Patient ID: Angel Gordon, female    DOB: May 09, 1955  Age: 64 y.o. MRN: [id number]  CC: Hypertension and Diabetes  This visit occurred during the SARS-CoV-2 public health emergency.  Safety protocols were in place, including screening questions prior to the visit, additional usage of staff PPE, and extensive cleaning of exam room while observing appropriate contact time as indicated for disinfecting solutions.    HPI Angel Gordon presents for f/up - She has felt well recently and offers no complaints.  She tells me her blood sugars have been well controlled.  According to prescription refills she would have run out of Metformin several months ago.  She tells me she is compliant with the statin and the SGLT2 inhibitor.  Outpatient Medications Prior to Visit  Medication Sig Dispense Refill  . atorvastatin (LIPITOR) 10 MG tablet Take 1 tablet (10 mg total) by mouth daily. 90 tablet 1  . Continuous Blood Gluc Receiver (FREESTYLE LIBRE READER) DEVI 1 Act by Does not apply route 2 (two) times daily. 1 Device 1  . Continuous Blood Gluc Sensor (FREESTYLE LIBRE 14 DAY SENSOR) MISC USE AS DIRECTED 2 each 2  . Continuous Blood Gluc Sensor (FREESTYLE LIBRE 14 DAY SENSOR) MISC USE AS DIRECTED 3 each 1  . Continuous Blood Gluc Sensor (FREESTYLE LIBRE SENSOR SYSTEM) MISC Use as instructed. 6 each 1  . FARXIGA 10 MG TABS tablet TAKE 1 TABLET BY MOUTH EVERY DAY 90 tablet 1  . metFORMIN (GLUCOPHAGE) 500 MG tablet Take 1 tablet (500 mg total) by mouth 2 (two) times daily. 180 tablet 1  . diclofenac sodium (VOLTAREN) 1 % GEL Apply 2 g topically 4 (four) times daily. (Patient not taking: Reported on 07/21/2018) 100 g 2   No facility-administered medications prior to visit.    ROS Review of Systems  Constitutional: Negative for diaphoresis, fatigue and unexpected weight change.  HENT: Negative.   Eyes: Negative.   Respiratory: Negative for cough, chest tightness, shortness of breath  and wheezing.   Cardiovascular: Negative for chest pain, palpitations and leg swelling.  Gastrointestinal: Negative for abdominal pain, diarrhea, nausea and vomiting.  Endocrine: Negative.   Genitourinary: Negative.  Negative for difficulty urinating.  Musculoskeletal: Negative for arthralgias and myalgias.  Skin: Negative.  Negative for color change and pallor.  Neurological: Negative.  Negative for dizziness and weakness.  Hematological: Negative for adenopathy. Does not bruise/bleed easily.  Psychiatric/Behavioral: Negative.     Objective:  BP 128/80 (BP Location: Left Arm, Patient Position: Sitting, Cuff Size: Large)   Pulse 84   Temp 97.9 F (36.6 C) (Oral)   Resp 16   Ht 4\' 11"  (1.499 m)   Wt 164 lb (74.4 kg)   SpO2 97%   BMI 33.12 kg/m   BP Readings from Last 3 Encounters:  02/09/19 128/80  07/21/18 (!) 146/78  01/28/18 123/75    Wt Readings from Last 3 Encounters:  02/09/19 164 lb (74.4 kg)  07/21/18 171 lb (77.6 kg)  01/28/18 165 lb (74.8 kg)    Physical Exam Vitals reviewed.  HENT:     Nose: Nose normal.     Mouth/Throat:     Mouth: Mucous membranes are moist.  Eyes:     General: No scleral icterus.    Conjunctiva/sclera: Conjunctivae normal.  Cardiovascular:     Rate and Rhythm: Normal rate and regular rhythm.     Heart sounds: No murmur.  Pulmonary:     Effort: Pulmonary effort is normal.  Breath sounds: No stridor. No wheezing, rhonchi or rales.  Abdominal:     General: Abdomen is protuberant. Bowel sounds are normal. There is no distension.     Palpations: Abdomen is soft. There is no hepatomegaly or splenomegaly.     Tenderness: There is no abdominal tenderness.  Musculoskeletal:        General: Normal range of motion.     Cervical back: Neck supple.     Right lower leg: No edema.     Left lower leg: No edema.  Lymphadenopathy:     Cervical: No cervical adenopathy.  Skin:    General: Skin is warm and dry.     Coloration: Skin is not  pale.  Neurological:     General: No focal deficit present.     Mental Status: She is alert.  Psychiatric:        Mood and Affect: Mood normal.        Behavior: Behavior normal.     Lab Results  Component Value Date   WBC 4.9 02/09/2019   HGB 13.8 02/09/2019   HCT 43.0 02/09/2019   PLT 209.0 02/09/2019   GLUCOSE 85 02/09/2019   CHOL 109 07/21/2018   TRIG 36.0 07/21/2018   HDL 66.80 07/21/2018   LDLCALC 35 07/21/2018   ALT 15 07/21/2018   AST 19 07/21/2018   NA 137 02/09/2019   K 4.4 02/09/2019   CL 102 02/09/2019   CREATININE 1.07 02/09/2019   BUN 17 02/09/2019   CO2 26 02/09/2019   TSH 1.72 07/21/2018   HGBA1C 6.9 (H) 02/09/2019   MICROALBUR <30 07/21/2018    DG Chest 2 View  Result Date: 07/21/2018 CLINICAL DATA:  Chest wall pain status post fall 3 weeks ago EXAM: CHEST - 2 VIEW COMPARISON:  07/27/2016 FINDINGS: The heart size and mediastinal contours are within normal limits. Both lungs are clear. The visualized skeletal structures are unremarkable. IMPRESSION: No active cardiopulmonary disease. Electronically Signed   By: Kathreen Devoid   On: 07/21/2018 10:17    Assessment & Plan:   Angel Gordon was seen today for hypertension and diabetes.  Diagnoses and all orders for this visit:  Essential hypertension- Her BP is well controlled. Lytes and renal function are normal. -     CBC with Differential/Platelet -     Basic metabolic panel -     Urinalysis, Routine w reflex microscopic  Type 2 diabetes mellitus with complication, without long-term current use of insulin ([place])- Her A1c is at 6.9%.  I recommended that she restart Metformin and to stay on the current dose of the SGLT2 inhibitor.  She refused vaccines today for tetanus, pneumonia, and influenza. -     Basic metabolic panel -     Hemoglobin A1c -     metFORMIN (GLUCOPHAGE) 500 MG tablet; Take 1 tablet (500 mg total) by mouth 2 (two) times daily. -     Ambulatory referral to Ophthalmology  Asymptomatic  microscopic hematuria- This has resolved. -     Urinalysis, Routine w reflex microscopic   I have discontinued [employer]'s diclofenac sodium. I am also having her maintain her FreeStyle Libre Reader, [employer], [employer] 14 Day Sensor, [place], [employer] 14 Day Sensor, atorvastatin, and metFORMIN.  Meds ordered this encounter  Medications  . metFORMIN (GLUCOPHAGE) 500 MG tablet    Sig: Take 1 tablet (500 mg total) by mouth 2 (two) times daily.    Dispense:  180 tablet  Refill:  1     Follow-up: Return in about 6 months (around 08/09/2019).  Scarlette Calico, MD

## 2019-02-09 NOTE — Patient Instructions (Signed)
Type 2 Diabetes Mellitus, Diagnosis, Adult Type 2 diabetes (type 2 diabetes mellitus) is a long-term (chronic) disease. In type 2 diabetes, one or both of these problems may be present:  The pancreas does not make enough of a hormone called insulin.  Cells in the body do not respond properly to insulin that the body makes (insulin resistance). Normally, insulin allows blood sugar (glucose) to enter cells in the body. The cells use glucose for energy. Insulin resistance or lack of insulin causes excess glucose to build up in the blood instead of going into cells. As a result, high blood glucose (hyperglycemia) develops. What increases the risk? The following factors may make you more likely to develop type 2 diabetes:  Having a family member with type 2 diabetes.  Being overweight or obese.  Having an inactive (sedentary) lifestyle.  Having been diagnosed with insulin resistance.  Having a history of prediabetes, gestational diabetes, or polycystic ovary syndrome (PCOS).  Being of American-Indian, African-American, Hispanic/Latino, or Asian/Pacific Islander descent. What are the signs or symptoms? In the early stage of this condition, you may not have symptoms. Symptoms develop slowly and may include:  Increased thirst (polydipsia).  Increased hunger(polyphagia).  Increased urination (polyuria).  Increased urination during the night (nocturia).  Unexplained weight loss.  Frequent infections that keep coming back (recurring).  Fatigue.  Weakness.  Vision changes, such as blurry vision.  Cuts or bruises that are slow to heal.  Tingling or numbness in the hands or feet.  Dark patches on the skin (acanthosis nigricans). How is this diagnosed? This condition is diagnosed based on your symptoms, your medical history, a physical exam, and your blood glucose level. Your blood glucose may be checked with one or more of the following blood tests:  A fasting blood glucose (FBG)  test. You will not be allowed to eat (you will fast) for 8 hours or longer before a blood sample is taken.  A random blood glucose test. This test checks blood glucose at any time of day regardless of when you ate.  An A1c (hemoglobin A1c) blood test. This test provides information about blood glucose control over the previous 2-3 months.  An oral glucose tolerance test (OGTT). This test measures your blood glucose at two times: ? After fasting. This is your baseline blood glucose level. ? Two hours after drinking a beverage that contains glucose. You may be diagnosed with type 2 diabetes if:  Your FBG level is 126 mg/dL (7.0 mmol/L) or higher.  Your random blood glucose level is 200 mg/dL (11.1 mmol/L) or higher.  Your A1c level is 6.5% or higher.  Your OGTT result is higher than 200 mg/dL (11.1 mmol/L). These blood tests may be repeated to confirm your diagnosis. How is this treated? Your treatment may be managed by a specialist called an endocrinologist. Type 2 diabetes may be treated by following instructions from your health care provider about:  Making diet and lifestyle changes. This may include: ? Following an individualized nutrition plan that is developed by a diet and nutrition specialist (registered dietitian). ? Exercising regularly. ? Finding ways to manage stress.  Checking your blood glucose level as often as told.  Taking diabetes medicines or insulin daily. This helps to keep your blood glucose levels in the healthy range. ? If you use insulin, you may need to adjust the dosage depending on how physically active you are and what foods you eat. Your health care provider will tell you how to adjust your dosage.    Taking medicines to help prevent complications from diabetes, such as: ? Aspirin. ? Medicine to lower cholesterol. ? Medicine to control blood pressure. Your health care provider will set individualized treatment goals for you. Your goals will be based on  your age, other medical conditions you have, and how you respond to diabetes treatment. Generally, the goal of treatment is to maintain the following blood glucose levels:  Before meals (preprandial): 80-130 mg/dL (4.4-7.2 mmol/L).  After meals (postprandial): below 180 mg/dL (10 mmol/L).  A1c level: less than 7%. Follow these instructions at home: Questions to ask your health care provider  Consider asking the following questions: ? Do I need to meet with a diabetes educator? ? Where can I find a support group for people with diabetes? ? What equipment will I need to manage my diabetes at home? ? What diabetes medicines do I need, and when should I take them? ? How often do I need to check my blood glucose? ? What number can I call if I have questions? ? When is my next appointment? General instructions  Take over-the-counter and prescription medicines only as told by your health care provider.  Keep all follow-up visits as told by your health care provider. This is important.  For more information about diabetes, visit: ? American Diabetes Association (ADA): www.diabetes.org ? American Association of Diabetes Educators (AADE): www.diabeteseducator.org Contact a health care provider if:  Your blood glucose is at or above 240 mg/dL (13.3 mmol/L) for 2 days in a row.  You have been sick or have had a fever for 2 days or longer, and you are not getting better.  You have any of the following problems for more than 6 hours: ? You cannot eat or drink. ? You have nausea and vomiting. ? You have diarrhea. Get help right away if:  Your blood glucose is lower than 54 mg/dL (3.0 mmol/L).  You become confused or you have trouble thinking clearly.  You have difficulty breathing.  You have moderate or large ketone levels in your urine. Summary  Type 2 diabetes (type 2 diabetes mellitus) is a long-term (chronic) disease. In type 2 diabetes, the pancreas does not make enough of a  hormone called insulin, or cells in the body do not respond properly to insulin that the body makes (insulin resistance).  This condition is treated by making diet and lifestyle changes and taking diabetes medicines or insulin.  Your health care provider will set individualized treatment goals for you. Your goals will be based on your age, other medical conditions you have, and how you respond to diabetes treatment.  Keep all follow-up visits as told by your health care provider. This is important. This information is not intended to replace advice given to you by your health care provider. Make sure you discuss any questions you have with your health care provider. Document Revised: 03/08/2017 Document Reviewed: 02/11/2015 Elsevier Patient Education  2020 Elsevier Inc.  

## 2019-02-10 ENCOUNTER — Encounter: Payer: Self-pay | Admitting: Internal Medicine

## 2019-02-11 MED ORDER — FREESTYLE LIBRE SENSOR SYSTEM MISC: 1 refills | Status: DC

## 2019-02-11 NOTE — Addendum Note (Signed)
Addended by: Janith Lima on: 02/11/2019 03:13 PM   Modules accepted: Orders

## 2019-02-12 ENCOUNTER — Other Ambulatory Visit: Payer: Self-pay | Admitting: Internal Medicine

## 2019-02-12 ENCOUNTER — Encounter: Payer: Self-pay | Admitting: Internal Medicine

## 2019-02-12 DIAGNOSIS — J301 Allergic rhinitis due to pollen: Secondary | ICD-10-CM

## 2019-02-12 LAB — HM DIABETES EYE EXAM

## 2019-02-12 MED ORDER — FLUTICASONE PROPIONATE 50 MCG/ACT NA SUSP: 2.0000 | Freq: Every day | NASAL | 1 refills | Status: DC

## 2019-02-12 NOTE — Telephone Encounter (Signed)
Can you send in flonase to [facility] please?

## 2019-02-17 ENCOUNTER — Encounter: Payer: Self-pay | Admitting: Internal Medicine

## 2019-02-21 ENCOUNTER — Encounter: Payer: Self-pay | Admitting: Internal Medicine

## 2019-02-25 ENCOUNTER — Encounter: Payer: Self-pay | Admitting: Internal Medicine

## 2019-03-16 DIAGNOSIS — N950 Postmenopausal bleeding: Secondary | ICD-10-CM

## 2019-03-16 HISTORY — DX: Postmenopausal bleeding: N95.0

## 2019-03-18 ENCOUNTER — Telehealth: Payer: Self-pay

## 2019-03-18 DIAGNOSIS — J301 Allergic rhinitis due to pollen: Secondary | ICD-10-CM

## 2019-03-18 DIAGNOSIS — E118 Type 2 diabetes mellitus with unspecified complications: Secondary | ICD-10-CM

## 2019-03-18 MED ORDER — METFORMIN HCL 500 MG PO TABS: 500.0000 mg | ORAL_TABLET | Freq: Two times a day (BID) | ORAL | 1 refills | Status: DC

## 2019-03-18 MED ORDER — FLUTICASONE PROPIONATE 50 MCG/ACT NA SUSP: 2.0000 | Freq: Every day | NASAL | 1 refills | Status: DC

## 2019-03-18 NOTE — Telephone Encounter (Signed)
erx has been sent as requested.  

## 2019-03-18 NOTE — Telephone Encounter (Signed)
Medication Requested:  metFORMIN (GLUCOPHAGE) 500 MG tablet  fluticasone (FLONASE) 50 MCG/ACT nasal spray  Is medication on med list:  Yes  (if no, inform pt they may need an appointment)  Is medication a controled: No   (yes = last OV with PCP)  -Controlled Substances: Adderall, Ritalin, oxycodone, hydrocodone, methadone, alprazolam, etc  Last visit with PCP: 1.18.2021   Is the OV > than 4 months: (yes = schedule an appt if one is not already made)  Pharmacy (Name, [place]): [facility] 90 day supply

## 2019-06-23 ENCOUNTER — Other Ambulatory Visit: Payer: Self-pay | Admitting: Internal Medicine

## 2019-06-23 DIAGNOSIS — E118 Type 2 diabetes mellitus with unspecified complications: Secondary | ICD-10-CM

## 2019-06-24 DIAGNOSIS — Z6834 Body mass index (BMI) 34.0-34.9, adult: Secondary | ICD-10-CM | POA: Diagnosis not present

## 2019-06-24 DIAGNOSIS — N760 Acute vaginitis: Secondary | ICD-10-CM | POA: Diagnosis not present

## 2019-06-24 DIAGNOSIS — Z01419 Encounter for gynecological examination (general) (routine) without abnormal findings: Secondary | ICD-10-CM | POA: Diagnosis not present

## 2019-06-24 DIAGNOSIS — Z803 Family history of malignant neoplasm of breast: Secondary | ICD-10-CM | POA: Diagnosis not present

## 2019-06-24 DIAGNOSIS — Z800 Family history of malignant neoplasm of digestive organs: Secondary | ICD-10-CM | POA: Diagnosis not present

## 2019-06-25 LAB — HM PAP SMEAR

## 2019-07-03 ENCOUNTER — Telehealth: Payer: Self-pay

## 2019-07-03 NOTE — Telephone Encounter (Signed)
Key: [id number]

## 2019-07-07 ENCOUNTER — Telehealth: Payer: Self-pay | Admitting: Internal Medicine

## 2019-07-07 DIAGNOSIS — E118 Type 2 diabetes mellitus with unspecified complications: Secondary | ICD-10-CM

## 2019-07-07 MED ORDER — FREESTYLE LIBRE 14 DAY SENSOR MISC: 1 refills | Status: DC

## 2019-07-07 MED ORDER — FREESTYLE LIBRE 14 DAY SENSOR MISC: 2 refills | Status: DC

## 2019-07-07 NOTE — Telephone Encounter (Signed)
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 Please advise.

## 2019-07-09 ENCOUNTER — Other Ambulatory Visit: Payer: Self-pay

## 2019-07-09 DIAGNOSIS — E785 Hyperlipidemia, unspecified: Secondary | ICD-10-CM

## 2019-07-09 MED ORDER — ATORVASTATIN CALCIUM 10 MG PO TABS: 10.0000 mg | ORAL_TABLET | Freq: Every day | ORAL | 1 refills | Status: DC

## 2019-07-23 ENCOUNTER — Encounter: Payer: Self-pay | Admitting: Internal Medicine

## 2019-07-23 ENCOUNTER — Other Ambulatory Visit: Payer: Self-pay

## 2019-07-23 ENCOUNTER — Ambulatory Visit (INDEPENDENT_AMBULATORY_CARE_PROVIDER_SITE_OTHER): Payer: BC Managed Care – PPO | Admitting: Internal Medicine

## 2019-07-23 VITALS — BP 126/76 | HR 68 | Temp 97.8°F | Resp 16 | Ht 59.0 in | Wt 169.0 lb

## 2019-07-23 DIAGNOSIS — I10 Essential (primary) hypertension: Secondary | ICD-10-CM | POA: Diagnosis not present

## 2019-07-23 DIAGNOSIS — K760 Fatty (change of) liver, not elsewhere classified: Secondary | ICD-10-CM | POA: Diagnosis not present

## 2019-07-23 DIAGNOSIS — Z0000 Encounter for general adult medical examination without abnormal findings: Secondary | ICD-10-CM

## 2019-07-23 DIAGNOSIS — E785 Hyperlipidemia, unspecified: Secondary | ICD-10-CM

## 2019-07-23 DIAGNOSIS — E118 Type 2 diabetes mellitus with unspecified complications: Secondary | ICD-10-CM | POA: Diagnosis not present

## 2019-07-23 DIAGNOSIS — K2100 Gastro-esophageal reflux disease with esophagitis, without bleeding: Secondary | ICD-10-CM

## 2019-07-23 LAB — BASIC METABOLIC PANEL
BUN: 15 mg/dL (ref 6–23)
CO2: 28 mEq/L (ref 19–32)
Calcium: 9.9 mg/dL (ref 8.4–10.5)
Chloride: 102 mEq/L (ref 96–112)
Creatinine, Ser: 0.97 mg/dL (ref 0.40–1.20)
GFR: 70.5 mL/min (ref >60.00)
Glucose, Bld: 73 mg/dL (ref 70–99)
Potassium: 3.8 mEq/L (ref 3.5–5.1)
Sodium: 138 mEq/L (ref 135–145)

## 2019-07-23 LAB — LIPID PANEL
Cholesterol: 109 mg/dL (ref 0–200)
HDL: 57.3 mg/dL (ref >39.00)
LDL Cholesterol: 42 mg/dL (ref 0–99)
NonHDL: 51.98
Total CHOL/HDL Ratio: 2
Triglycerides: 50 mg/dL (ref 0.0–149.0)
VLDL: 10 mg/dL (ref 0.0–40.0)

## 2019-07-23 LAB — CBC WITH DIFFERENTIAL/PLATELET
Basophils Absolute: 0.1 10*3/uL (ref 0.0–0.1)
Basophils Relative: 1.2 % (ref 0.0–3.0)
Eosinophils Absolute: 0.5 10*3/uL (ref 0.0–0.7)
Eosinophils Relative: 6.7 % — ABNORMAL HIGH (ref 0.0–5.0)
HCT: 39.5 % (ref 36.0–46.0)
Hemoglobin: 12.5 g/dL (ref 12.0–15.0)
Lymphocytes Relative: 42.9 % (ref 12.0–46.0)
Lymphs Abs: 3 10*3/uL (ref 0.7–4.0)
MCHC: 31.7 g/dL (ref 30.0–36.0)
MCV: 87 fl (ref 78.0–100.0)
Monocytes Absolute: 0.6 10*3/uL (ref 0.1–1.0)
Monocytes Relative: 8.2 % (ref 3.0–12.0)
Neutro Abs: 2.9 10*3/uL (ref 1.4–7.7)
Neutrophils Relative %: 41 % — ABNORMAL LOW (ref 43.0–77.0)
Platelets: 197 10*3/uL (ref 150.0–400.0)
RBC: 4.55 Mil/uL (ref 3.87–5.11)
RDW: 14.5 % (ref 11.5–15.5)
WBC: 7 10*3/uL (ref 4.0–10.5)

## 2019-07-23 LAB — HEPATIC FUNCTION PANEL
ALT: 21 U/L (ref 0–35)
AST: 23 U/L (ref 0–37)
Albumin: 4.1 g/dL (ref 3.5–5.2)
Alkaline Phosphatase: 77 U/L (ref 39–117)
Bilirubin, Direct: 0.2 mg/dL (ref 0.0–0.3)
Total Bilirubin: 0.4 mg/dL (ref 0.2–1.2)
Total Protein: 8.4 g/dL — ABNORMAL HIGH (ref 6.0–8.3)

## 2019-07-23 LAB — TSH: TSH: 2.4 u[IU]/mL (ref 0.35–4.50)

## 2019-07-23 LAB — URINALYSIS, ROUTINE W REFLEX MICROSCOPIC
Bilirubin Urine: NEGATIVE
Ketones, ur: NEGATIVE
Leukocytes,Ua: NEGATIVE
Nitrite: NEGATIVE
Specific Gravity, Urine: 1.02 (ref 1.000–1.030)
Total Protein, Urine: NEGATIVE
Urine Glucose: >=1000 — AB
Urobilinogen, UA: 0.2 (ref 0.0–1.0)
pH: 6 (ref 5.0–8.0)

## 2019-07-23 LAB — MICROALBUMIN / CREATININE URINE RATIO
Creatinine,U: 77.4 mg/dL
Microalb Creat Ratio: 0.9 mg/g (ref 0.0–30.0)
Microalb, Ur: <0.7 mg/dL (ref 0.0–1.9)

## 2019-07-23 LAB — HEMOGLOBIN A1C: Hgb A1c MFr Bld: 7 % — ABNORMAL HIGH (ref 4.6–6.5)

## 2019-07-23 NOTE — Patient Instructions (Signed)
Health Maintenance, Female Adopting a healthy lifestyle and getting preventive care are important in promoting health and wellness. Ask your health care provider about:  The right schedule for you to have regular tests and exams.  Things you can do on your own to prevent diseases and keep yourself healthy. What should I know about diet, weight, and exercise? Eat a healthy diet   Eat a diet that includes plenty of vegetables, fruits, low-fat dairy products, and lean protein.  Do not eat a lot of foods that are high in solid fats, added sugars, or sodium. Maintain a healthy weight Body mass index (BMI) is used to identify weight problems. It estimates body fat based on height and weight. Your health care provider can help determine your BMI and help you achieve or maintain a healthy weight. Get regular exercise Get regular exercise. This is one of the most important things you can do for your health. Most adults should:  Exercise for at least 150 minutes each week. The exercise should increase your heart rate and make you sweat (moderate-intensity exercise).  Do strengthening exercises at least twice a week. This is in addition to the moderate-intensity exercise.  Spend less time sitting. Even light physical activity can be beneficial. Watch cholesterol and blood lipids Have your blood tested for lipids and cholesterol at 62 years of age, then have this test every 5 years. Have your cholesterol levels checked more often if:  Your lipid or cholesterol levels are high.  You are older than 62 years of age.  You are at high risk for heart disease. What should I know about cancer screening? Depending on your health history and family history, you may need to have cancer screening at various ages. This may include screening for:  Breast cancer.  Cervical cancer.  Colorectal cancer.  Skin cancer.  Lung cancer. What should I know about heart disease, diabetes, and high blood  pressure? Blood pressure and heart disease  High blood pressure causes heart disease and increases the risk of stroke. This is more likely to develop in people who have high blood pressure readings, are of African descent, or are overweight.  Have your blood pressure checked: ? Every 3-5 years if you are 18-39 years of age. ? Every year if you are 40 years old or older. Diabetes Have regular diabetes screenings. This checks your fasting blood sugar level. Have the screening done:  Once every three years after age 40 if you are at a normal weight and have a low risk for diabetes.  More often and at a younger age if you are overweight or have a high risk for diabetes. What should I know about preventing infection? Hepatitis B If you have a higher risk for hepatitis B, you should be screened for this virus. Talk with your health care provider to find out if you are at risk for hepatitis B infection. Hepatitis C Testing is recommended for:  Everyone born from 1945 through 1965.  Anyone with known risk factors for hepatitis C. Sexually transmitted infections (STIs)  Get screened for STIs, including gonorrhea and chlamydia, if: ? You are sexually active and are younger than 62 years of age. ? You are older than 62 years of age and your health care provider tells you that you are at risk for this type of infection. ? Your sexual activity has changed since you were last screened, and you are at increased risk for chlamydia or gonorrhea. Ask your health care provider if   you are at risk.  Ask your health care provider about whether you are at high risk for HIV. Your health care provider may recommend a prescription medicine to help prevent HIV infection. If you choose to take medicine to prevent HIV, you should first get tested for HIV. You should then be tested every 3 months for as long as you are taking the medicine. Pregnancy  If you are about to stop having your period (premenopausal) and  you may become pregnant, seek counseling before you get pregnant.  Take 400 to 800 micrograms (mcg) of folic acid every day if you become pregnant.  Ask for birth control (contraception) if you want to prevent pregnancy. Osteoporosis and menopause Osteoporosis is a disease in which the bones lose minerals and strength with aging. This can result in bone fractures. If you are 65 years old or older, or if you are at risk for osteoporosis and fractures, ask your health care provider if you should:  Be screened for bone loss.  Take a calcium or vitamin D supplement to lower your risk of fractures.  Be given hormone replacement therapy (HRT) to treat symptoms of menopause. Follow these instructions at home: Lifestyle  Do not use any products that contain nicotine or tobacco, such as cigarettes, e-cigarettes, and chewing tobacco. If you need help quitting, ask your health care provider.  Do not use street drugs.  Do not share needles.  Ask your health care provider for help if you need support or information about quitting drugs. Alcohol use  Do not drink alcohol if: ? Your health care provider tells you not to drink. ? You are pregnant, may be pregnant, or are planning to become pregnant.  If you drink alcohol: ? Limit how much you use to 0-1 drink a day. ? Limit intake if you are breastfeeding.  Be aware of how much alcohol is in your drink. In the [place] one drink equals one 12 oz bottle of beer (355 mL), one 5 oz glass of wine (148 mL), or one 1 oz glass of hard liquor (44 mL). General instructions  Schedule regular health, dental, and eye exams.  Stay current with your vaccines.  Tell your health care provider if: ? You often feel depressed. ? You have ever been abused or do not feel safe at home. Summary  Adopting a healthy lifestyle and getting preventive care are important in promoting health and wellness.  Follow your health care provider's instructions about healthy  diet, exercising, and getting tested or screened for diseases.  Follow your health care provider's instructions on monitoring your cholesterol and blood pressure. This information is not intended to replace advice given to you by your health care provider. Make sure you discuss any questions you have with your health care provider. Document Revised: 01/01/2018 Document Reviewed: 01/01/2018 Elsevier Patient Education  2020 Elsevier Inc.  

## 2019-07-23 NOTE — Progress Notes (Signed)
Subjective:  Patient ID: Angel Gordon, female    DOB: Jun 17, 1955  Age: 64 y.o. MRN: [id number]  CC: Annual Exam, Hypertension, Hyperlipidemia, and Diabetes  This visit occurred during the SARS-CoV-2 public health emergency.  Safety protocols were in place, including screening questions prior to the visit, additional usage of staff PPE, and extensive cleaning of exam room while observing appropriate contact time as indicated for disinfecting solutions.    HPI Angel Gordon presents for a CPX.  She has felt well recently and offers no complaints.  She tells me her blood pressure and blood sugars have been well controlled.  She is active and denies any recent episodes of chest pain, shortness of breath, palpitations, edema, fatigue, or polys.  She has gained weight.  Outpatient Medications Prior to Visit  Medication Sig Dispense Refill  . atorvastatin (LIPITOR) 10 MG tablet Take 1 tablet (10 mg total) by mouth daily. 90 tablet 1  . Continuous Blood Gluc Receiver (FREESTYLE LIBRE READER) DEVI 1 Act by Does not apply route 2 (two) times daily. 1 Device 1  . Continuous Blood Gluc Sensor (FREESTYLE LIBRE 14 DAY SENSOR) MISC USE AS DIRECTED 2 each 2  . Continuous Blood Gluc Sensor (FREESTYLE LIBRE 14 DAY SENSOR) MISC USE AS DIRECTED 3 each 1  . Continuous Blood Gluc Sensor (FREESTYLE LIBRE SENSOR SYSTEM) MISC Use as instructed. 6 each 1  . FARXIGA 10 MG TABS tablet TAKE 1 TABLET BY MOUTH EVERY DAY 90 tablet 0  . fluticasone (FLONASE) 50 MCG/ACT nasal spray Place 2 sprays into both nostrils daily. 48 g 1  . metFORMIN (GLUCOPHAGE) 500 MG tablet Take 1 tablet (500 mg total) by mouth 2 (two) times daily. 180 tablet 1   No facility-administered medications prior to visit.    ROS Review of Systems  Constitutional: Positive for unexpected weight change. Negative for appetite change, chills, diaphoresis and fatigue.  HENT: Negative.   Eyes: Negative.   Respiratory: Negative for cough,  chest tightness, shortness of breath and wheezing.   Cardiovascular: Negative for chest pain, palpitations and leg swelling.  Gastrointestinal: Negative for abdominal pain, constipation, diarrhea, nausea and vomiting.  Endocrine: Negative.  Negative for cold intolerance and heat intolerance.  Genitourinary: Negative.  Negative for difficulty urinating and dysuria.  Musculoskeletal: Negative for arthralgias, back pain and myalgias.  Skin: Negative.  Negative for color change and pallor.  Neurological: Negative.  Negative for dizziness, weakness, light-headedness, numbness and headaches.  Hematological: Negative for adenopathy. Does not bruise/bleed easily.  Psychiatric/Behavioral: Negative.     Objective:  BP 126/76 (BP Location: Left Arm, Patient Position: Sitting, Cuff Size: Large)   Pulse 68   Temp 97.8 F (36.6 C) (Oral)   Resp 16   Ht 4\' 11"  (1.499 m)   Wt 169 lb (76.7 kg)   SpO2 99%   BMI 34.13 kg/m   BP Readings from Last 3 Encounters:  07/23/19 126/76  02/09/19 128/80  07/21/18 (!) 146/78    Wt Readings from Last 3 Encounters:  07/23/19 169 lb (76.7 kg)  02/09/19 164 lb (74.4 kg)  07/21/18 171 lb (77.6 kg)    Physical Exam Vitals reviewed.  Constitutional:      Appearance: She is obese.  HENT:     Nose: Nose normal.     Mouth/Throat:     Mouth: Mucous membranes are moist.  Eyes:     General: No scleral icterus.    Conjunctiva/sclera: Conjunctivae normal.  Cardiovascular:     Rate and  Rhythm: Normal rate and regular rhythm.     Heart sounds: No murmur heard.   Pulmonary:     Effort: Pulmonary effort is normal.     Breath sounds: No stridor. No wheezing, rhonchi or rales.  Abdominal:     General: Abdomen is protuberant. Bowel sounds are normal. There is no distension.     Palpations: Abdomen is soft. There is no hepatomegaly, splenomegaly or mass.     Tenderness: There is no abdominal tenderness.  Musculoskeletal:        General: Normal range of motion.      Cervical back: Neck supple.     Right lower leg: No edema.     Left lower leg: No edema.  Lymphadenopathy:     Cervical: No cervical adenopathy.  Skin:    General: Skin is warm and dry.     Coloration: Skin is not pale.  Neurological:     General: No focal deficit present.     Mental Status: She is alert.  Psychiatric:        Mood and Affect: Mood normal.        Behavior: Behavior normal.     Lab Results  Component Value Date   WBC 7.0 07/23/2019   HGB 12.5 07/23/2019   HCT 39.5 07/23/2019   PLT 197.0 07/23/2019   GLUCOSE 73 07/23/2019   CHOL 109 07/23/2019   TRIG 50.0 07/23/2019   HDL 57.30 07/23/2019   LDLCALC 42 07/23/2019   ALT 21 07/23/2019   AST 23 07/23/2019   NA 138 07/23/2019   K 3.8 07/23/2019   CL 102 07/23/2019   CREATININE 0.97 07/23/2019   BUN 15 07/23/2019   CO2 28 07/23/2019   TSH 2.40 07/23/2019   HGBA1C 7.0 (H) 07/23/2019   MICROALBUR <0.7 07/23/2019    DG Chest 2 View  Result Date: 07/21/2018 CLINICAL DATA:  Chest wall pain status post fall 3 weeks ago EXAM: CHEST - 2 VIEW COMPARISON:  07/27/2016 FINDINGS: The heart size and mediastinal contours are within normal limits. Both lungs are clear. The visualized skeletal structures are unremarkable. IMPRESSION: No active cardiopulmonary disease. Electronically Signed   By: Kathreen Devoid   On: 07/21/2018 10:17    Assessment & Plan:   Angel Gordon was seen today for annual exam, hypertension, hyperlipidemia and diabetes.  Diagnoses and all orders for this visit:  Essential hypertension- Her blood pressure is well controlled.  Electrolytes and renal function are normal. -     CBC with Differential/Platelet; Future -     Basic metabolic panel; Future -     Urinalysis, Routine w reflex microscopic; Future -     Urinalysis, Routine w reflex microscopic -     Basic metabolic panel -     CBC with Differential/Platelet  Fatty liver disease, nonalcoholic- Her liver enzymes are normal now. -     Hepatic  function panel; Future -     Hepatic function panel  Gastroesophageal reflux disease with esophagitis without hemorrhage- She has had no symptoms related to this.  No complications noted. -     CBC with Differential/Platelet; Future -     CBC with Differential/Platelet  Type II diabetes mellitus with manifestations ([place])- Her blood sugars are adequately well controlled with an A1c of 7.0%.  Will continue the combination of Metformin and an SGLT2 inhibitor. -     Basic metabolic panel; Future -     Microalbumin / creatinine urine ratio; Future -     Hemoglobin  A1c; Future -     HM Diabetes Foot Exam -     Hemoglobin A1c -     Microalbumin / creatinine urine ratio -     Basic metabolic panel -     metFORMIN (GLUCOPHAGE) 500 MG tablet; Take 1 tablet (500 mg total) by mouth 2 (two) times daily.  Hyperlipidemia with target LDL less than 100- She has achieved her LDL goal and is doing well on the statin. -     Lipid panel; Future -     Hepatic function panel; Future -     TSH; Future -     TSH -     Hepatic function panel -     Lipid panel  Routine health maintenance- Exam completed, labs reviewed, vaccines reviewed-she has been vaccinated against COVID-19 but she refused vaccines against influenza, pneumococcus, and Tdap, cancer screenings are up-to-date, labs reviewed, patient education material was given.  Type 2 diabetes mellitus with complication, without long-term current use of insulin (HCC) -     metFORMIN (GLUCOPHAGE) 500 MG tablet; Take 1 tablet (500 mg total) by mouth 2 (two) times daily.   I am having Angel Gordon maintain her [address] Reader, [employer], fluticasone, [place], FreeStyle Libre 14 Day Sensor, [employer] 14 Day Sensor, atorvastatin, and metFORMIN.  Meds ordered this encounter  Medications  . metFORMIN (GLUCOPHAGE) 500 MG tablet    Sig: Take 1 tablet (500 mg total) by mouth 2 (two) times daily.    Dispense:  180 tablet     Refill:  1     Follow-up: Return in about 6 months (around 01/23/2020).  Scarlette Calico, MD

## 2019-07-25 MED ORDER — METFORMIN HCL 500 MG PO TABS: 500.0000 mg | ORAL_TABLET | Freq: Two times a day (BID) | ORAL | 1 refills | Status: DC

## 2019-08-12 DIAGNOSIS — Z1231 Encounter for screening mammogram for malignant neoplasm of breast: Secondary | ICD-10-CM | POA: Diagnosis not present

## 2019-08-12 LAB — HM MAMMOGRAPHY

## 2019-08-13 ENCOUNTER — Other Ambulatory Visit: Payer: Self-pay | Admitting: Internal Medicine

## 2019-08-13 DIAGNOSIS — E118 Type 2 diabetes mellitus with unspecified complications: Secondary | ICD-10-CM

## 2019-08-21 ENCOUNTER — Other Ambulatory Visit: Payer: Self-pay | Admitting: Internal Medicine

## 2019-08-27 ENCOUNTER — Encounter: Payer: Self-pay | Admitting: Internal Medicine

## 2019-09-18 ENCOUNTER — Other Ambulatory Visit: Payer: Self-pay | Admitting: Internal Medicine

## 2019-09-18 DIAGNOSIS — E118 Type 2 diabetes mellitus with unspecified complications: Secondary | ICD-10-CM

## 2019-11-02 ENCOUNTER — Encounter: Payer: Self-pay | Admitting: Internal Medicine

## 2019-11-02 ENCOUNTER — Other Ambulatory Visit: Payer: Self-pay | Admitting: Internal Medicine

## 2019-11-02 DIAGNOSIS — E785 Hyperlipidemia, unspecified: Secondary | ICD-10-CM

## 2019-11-06 ENCOUNTER — Encounter: Payer: Self-pay | Admitting: Internal Medicine

## 2019-12-06 ENCOUNTER — Other Ambulatory Visit: Payer: Self-pay | Admitting: Internal Medicine

## 2019-12-06 DIAGNOSIS — J301 Allergic rhinitis due to pollen: Secondary | ICD-10-CM

## 2020-02-08 ENCOUNTER — Ambulatory Visit: Payer: BC Managed Care – PPO | Admitting: Podiatry

## 2020-02-10 ENCOUNTER — Telehealth: Payer: Self-pay | Admitting: Internal Medicine

## 2020-02-10 NOTE — Telephone Encounter (Signed)
Paramed called and is requesting a call back in regards to a diabetes form.    Phone: [phone] ext: [phone]

## 2020-02-11 ENCOUNTER — Ambulatory Visit: Payer: BC Managed Care – PPO | Admitting: Podiatry

## 2020-02-11 ENCOUNTER — Ambulatory Visit (INDEPENDENT_AMBULATORY_CARE_PROVIDER_SITE_OTHER): Payer: BC Managed Care – PPO

## 2020-02-11 ENCOUNTER — Encounter: Payer: Self-pay | Admitting: Podiatry

## 2020-02-11 ENCOUNTER — Other Ambulatory Visit: Payer: Self-pay

## 2020-02-11 DIAGNOSIS — M79671 Pain in right foot: Secondary | ICD-10-CM

## 2020-02-11 DIAGNOSIS — E1140 Type 2 diabetes mellitus with diabetic neuropathy, unspecified: Secondary | ICD-10-CM | POA: Diagnosis not present

## 2020-02-11 DIAGNOSIS — M79674 Pain in right toe(s): Secondary | ICD-10-CM

## 2020-02-11 DIAGNOSIS — M79675 Pain in left toe(s): Secondary | ICD-10-CM | POA: Diagnosis not present

## 2020-02-11 DIAGNOSIS — M7671 Peroneal tendinitis, right leg: Secondary | ICD-10-CM | POA: Diagnosis not present

## 2020-02-11 DIAGNOSIS — B351 Tinea unguium: Secondary | ICD-10-CM | POA: Diagnosis not present

## 2020-02-11 DIAGNOSIS — E1149 Type 2 diabetes mellitus with other diabetic neurological complication: Secondary | ICD-10-CM

## 2020-02-11 MED ORDER — DICLOFENAC SODIUM 75 MG PO TBEC
75.0000 mg | DELAYED_RELEASE_TABLET | Freq: Two times a day (BID) | ORAL | 2 refills | Status: DC
Start: 2020-02-11 — End: 2021-04-05

## 2020-02-11 NOTE — Progress Notes (Signed)
Subjective:   Patient ID: Angel Gordon, female   DOB: 65 y.o.   MRN: [id number]   HPI Patient is a long-term diabetic he has pain in the outside of the right foot and has noticed that she cannot cut bit of gotten thick and get painful with shoe gear and she has had them worked on in the past.   ROS      Objective:  Physical Exam  Neurovascular status was found to be intact with mild diminishment sharp dull vibratory.  On the right foot I did note that there is some swelling inflammation base of the fifth metatarsal that is moderately painful with no indication of tendon dysfunction.  Patient is found to have thick incurvated nailbeds 1-5 both feet with moderate discomfort when pressed and irritation in the corners     Assessment:  Peroneal tendinitis right at insertion along with mycotic nail infection with pain with long-term diabetes bilateral     Plan:  H&P educated her on condition and discussed injection treatment topical and oral treatment for the outside of the right foot and she does not want injection at this time.  She will begin topical she will use ice support shoes and if symptoms persist over the next month may require treatment.  Debrided nailbeds 1-5 both feet no iatrogenic bleeding diabetic education rendered reappoint to recheck

## 2020-02-12 ENCOUNTER — Telehealth: Payer: Self-pay | Admitting: Internal Medicine

## 2020-02-12 LAB — HM DIABETES EYE EXAM

## 2020-02-12 NOTE — Telephone Encounter (Signed)
#  [phone]  Ext [phone] Paramed calling to do a diabetes questionnaire for the patient.

## 2020-02-15 ENCOUNTER — Ambulatory Visit: Payer: BC Managed Care – PPO | Admitting: Podiatrist

## 2020-02-16 NOTE — Telephone Encounter (Signed)
Paramed calling back for an update on the diabetes questionnaire

## 2020-02-29 ENCOUNTER — Telehealth: Payer: Self-pay | Admitting: Internal Medicine

## 2020-02-29 NOTE — Telephone Encounter (Signed)
[facility] called and was wondering the status of the diabetes form.

## 2020-02-29 NOTE — Telephone Encounter (Signed)
I do not see a form was faxed or have been filled out in my absence during when it was originally sent. I do not see a form that was completed and sent to scan. Form needs to be sent again to [facility]. 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

## 2020-03-01 NOTE — Telephone Encounter (Signed)
[facility] called and was wondering if the fax was received. It was sent to [facility].

## 2020-03-01 NOTE — Telephone Encounter (Signed)
PCP completed the form earlier today and it was faxed back. A copy was sent to scan also.

## 2020-03-14 ENCOUNTER — Ambulatory Visit (AMBULATORY_SURGERY_CENTER): Payer: Self-pay

## 2020-03-14 ENCOUNTER — Other Ambulatory Visit: Payer: Self-pay

## 2020-03-14 VITALS — Ht 59.0 in | Wt 175.0 lb

## 2020-03-14 DIAGNOSIS — Z1211 Encounter for screening for malignant neoplasm of colon: Secondary | ICD-10-CM

## 2020-03-14 MED ORDER — PEG-KCL-NACL-NASULF-NA ASC-C 100 G PO SOLR: 1.0000 | Freq: Once | ORAL | 0 refills | Status: AC

## 2020-03-14 NOTE — Progress Notes (Signed)
No egg or soy allergy known to patient  No issues with past sedation with any surgeries or procedures No intubation problems in the past  No FH of Malignant Hyperthermia No diet pills per patient No home 02 use per patient  No blood thinners per patient  Pt denies issues with constipation  No A fib or A flutter  EMMI video to pt or via [place] 19 guidelines implemented in PV today with Pt and RN  Pt is fully vaccinated  for Covid  And booster done.  Pt denies loose or missing teeth, denies dentures, partials, dental implants, capped or bonded teeth  Moviprep came up as pt preferred prep for her insurance, instructions re-entered      NO PA's for preps discussed with pt In PV today  Discussed with pt there will be an out-of-pocket cost for prep and that varies from $0 to 70 dollars   Due to the COVID-19 pandemic we are asking patients to follow certain guidelines.  Pt aware of COVID protocols and [facility] guidelines

## 2020-03-15 ENCOUNTER — Encounter: Payer: Self-pay | Admitting: Internal Medicine

## 2020-03-18 ENCOUNTER — Ambulatory Visit: Payer: BC Managed Care – PPO | Admitting: Internal Medicine

## 2020-03-18 ENCOUNTER — Other Ambulatory Visit: Payer: Self-pay

## 2020-03-18 ENCOUNTER — Encounter: Payer: Self-pay | Admitting: Internal Medicine

## 2020-03-18 VITALS — BP 126/86 | HR 72 | Temp 97.7°F | Resp 16 | Ht 59.0 in | Wt 175.0 lb

## 2020-03-18 DIAGNOSIS — N938 Other specified abnormal uterine and vaginal bleeding: Secondary | ICD-10-CM | POA: Diagnosis not present

## 2020-03-18 DIAGNOSIS — N1831 Chronic kidney disease, stage 3a: Secondary | ICD-10-CM

## 2020-03-18 DIAGNOSIS — I10 Essential (primary) hypertension: Secondary | ICD-10-CM | POA: Diagnosis not present

## 2020-03-18 DIAGNOSIS — E118 Type 2 diabetes mellitus with unspecified complications: Secondary | ICD-10-CM | POA: Diagnosis not present

## 2020-03-18 DIAGNOSIS — D892 Hypergammaglobulinemia, unspecified: Secondary | ICD-10-CM

## 2020-03-18 LAB — CBC WITH DIFFERENTIAL/PLATELET
Basophils Absolute: 0 10*3/uL (ref 0.0–0.1)
Basophils Relative: 0.8 % (ref 0.0–3.0)
Eosinophils Absolute: 0.3 10*3/uL (ref 0.0–0.7)
Eosinophils Relative: 5.9 % — ABNORMAL HIGH (ref 0.0–5.0)
HCT: 39.3 % (ref 36.0–46.0)
Hemoglobin: 12.6 g/dL (ref 12.0–15.0)
Lymphocytes Relative: 47.5 % — ABNORMAL HIGH (ref 12.0–46.0)
Lymphs Abs: 2.7 10*3/uL (ref 0.7–4.0)
MCHC: 32.1 g/dL (ref 30.0–36.0)
MCV: 87.6 fl (ref 78.0–100.0)
Monocytes Absolute: 0.4 10*3/uL (ref 0.1–1.0)
Monocytes Relative: 7.2 % (ref 3.0–12.0)
Neutro Abs: 2.2 10*3/uL (ref 1.4–7.7)
Neutrophils Relative %: 38.6 % — ABNORMAL LOW (ref 43.0–77.0)
Platelets: 174 10*3/uL (ref 150.0–400.0)
RBC: 4.48 Mil/uL (ref 3.87–5.11)
RDW: 14.9 % (ref 11.5–15.5)
WBC: 5.7 10*3/uL (ref 4.0–10.5)

## 2020-03-18 LAB — BASIC METABOLIC PANEL
BUN: 17 mg/dL (ref 6–23)
CO2: 28 mEq/L (ref 19–32)
Calcium: 9.9 mg/dL (ref 8.4–10.5)
Chloride: 104 mEq/L (ref 96–112)
Creatinine, Ser: 1.09 mg/dL (ref 0.40–1.20)
GFR: 54.54 mL/min — ABNORMAL LOW (ref >60.00)
Glucose, Bld: 86 mg/dL (ref 70–99)
Potassium: 4.1 mEq/L (ref 3.5–5.1)
Sodium: 139 mEq/L (ref 135–145)

## 2020-03-18 LAB — HEMOGLOBIN A1C: Hgb A1c MFr Bld: 7 % — ABNORMAL HIGH (ref 4.6–6.5)

## 2020-03-18 NOTE — Progress Notes (Signed)
Subjective:  Patient ID: Angel Gordon, female    DOB: [birth year]  Age: 65 y.o. MRN: [id number]  CC: Hypertension and Diabetes  This visit occurred during the SARS-CoV-2 public health emergency.  Safety protocols were in place, including screening questions prior to the visit, additional usage of staff PPE, and extensive cleaning of exam room while observing appropriate contact time as indicated for disinfecting solutions.    HPI Angel Gordon presents for f/up - Since I last saw Angel Gordon she has developed some vaginal spotting.  She tells me Angel Gordon blood sugars have been well controlled.  She denies polys.  She is active and denies any episodes of chest pain, shortness of breath, palpitations, edema, or fatigue.  Outpatient Medications Prior to Visit  Medication Sig Dispense Refill  . atorvastatin (LIPITOR) 10 MG tablet TAKE 1 TABLET DAILY 90 tablet 1  . Continuous Blood Gluc Sensor (FREESTYLE LIBRE 14 DAY SENSOR) MISC USE AS INSTRUCTED. 90 each 1  . diclofenac (VOLTAREN) 75 MG EC tablet Take 1 tablet (75 mg total) by mouth 2 (two) times daily. 50 tablet 2  . diclofenac Sodium (VOLTAREN) 1 % GEL diclofenac 1 % topical gel  APPLY 2 GRAMS TO AFFECTED AREA 4 TIMES A DAY    . FARXIGA 10 MG TABS tablet TAKE 1 TABLET BY MOUTH EVERY DAY 90 tablet 1  . fluticasone (FLONASE) 50 MCG/ACT nasal spray SPRAY 2 SPRAYS INTO EACH NOSTRIL EVERY DAY 48 mL 1  . metFORMIN (GLUCOPHAGE) 500 MG tablet Take 1 tablet (500 mg total) by mouth 2 (two) times daily. 180 tablet 1   No facility-administered medications prior to visit.    ROS Review of Systems  Constitutional: Negative.  Negative for diaphoresis, fatigue and unexpected weight change.  HENT: Negative.   Eyes: Negative for visual disturbance.  Respiratory: Negative for cough, chest tightness, shortness of breath and wheezing.   Cardiovascular: Negative for chest pain, palpitations and leg swelling.  Gastrointestinal: Negative for abdominal pain,  constipation, diarrhea and nausea.  Endocrine: Negative.   Genitourinary: Positive for vaginal bleeding. Negative for difficulty urinating, pelvic pain, vaginal discharge and vaginal pain.  Musculoskeletal: Negative.  Negative for arthralgias.  Neurological: Negative.  Negative for dizziness, weakness and light-headedness.  Hematological: Negative for adenopathy. Does not bruise/bleed easily.  Psychiatric/Behavioral: Negative.     Objective:  BP 126/86   Pulse 72   Temp 97.7 F (36.5 C) (Oral)   Resp 16   Ht 4\' 11"  (1.499 m)   Wt 175 lb (79.4 kg)   LMP 02/23/2011 Comment: pt states she has had recent return of "periods" after going through menopause in Angel Gordon early 35s, instruct pt to call Angel Gordon Gyn to report this, pt verb understanding   SpO2 96%   BMI 35.35 kg/m   BP Readings from Last 3 Encounters:  03/18/20 126/86  07/23/19 126/76  02/09/19 128/80    Wt Readings from Last 3 Encounters:  03/18/20 175 lb (79.4 kg)  03/14/20 175 lb (79.4 kg)  07/23/19 169 lb (76.7 kg)    Physical Exam Vitals reviewed.  HENT:     Nose: Nose normal.     Mouth/Throat:     Mouth: Mucous membranes are moist.  Eyes:     General: No scleral icterus.    Conjunctiva/sclera: Conjunctivae normal.  Cardiovascular:     Rate and Rhythm: Normal rate and regular rhythm.     Heart sounds: No murmur heard.   Pulmonary:     Effort: Pulmonary effort is  normal.     Breath sounds: No stridor. No wheezing, rhonchi or rales.  Abdominal:     General: Abdomen is protuberant. Bowel sounds are normal. There is no distension.     Palpations: Abdomen is soft. There is no hepatomegaly, splenomegaly or mass.     Tenderness: There is no abdominal tenderness.  Musculoskeletal:        General: Normal range of motion.     Cervical back: Neck supple.     Right lower leg: No edema.     Left lower leg: No edema.  Lymphadenopathy:     Cervical: No cervical adenopathy.  Skin:    General: Skin is warm and dry.   Neurological:     General: No focal deficit present.     Mental Status: She is alert.  Psychiatric:        Mood and Affect: Mood normal.        Behavior: Behavior normal.     Lab Results  Component Value Date   WBC 5.7 03/18/2020   HGB 12.6 03/18/2020   HCT 39.3 03/18/2020   PLT 174.0 03/18/2020   GLUCOSE 86 03/18/2020   CHOL 109 07/23/2019   TRIG 50.0 07/23/2019   HDL 57.30 07/23/2019   LDLCALC 42 07/23/2019   ALT 21 07/23/2019   AST 23 07/23/2019   NA 139 03/18/2020   K 4.1 03/18/2020   CL 104 03/18/2020   CREATININE 1.09 03/18/2020   BUN 17 03/18/2020   CO2 28 03/18/2020   TSH 2.40 07/23/2019   HGBA1C 7.0 (H) 03/18/2020   MICROALBUR <0.7 07/23/2019    DG Chest 2 View  Result Date: 07/21/2018 CLINICAL DATA:  Chest wall pain status post fall 3 weeks ago EXAM: CHEST - 2 VIEW COMPARISON:  07/27/2016 FINDINGS: The heart size and mediastinal contours are within normal limits. Both lungs are clear. The visualized skeletal structures are unremarkable. IMPRESSION: No active cardiopulmonary disease. Electronically Signed   By: Kathreen Devoid   On: 07/21/2018 10:17    Assessment & Plan:   Angel Gordon was seen today for hypertension and diabetes.  Diagnoses and all orders for this visit:  Essential hypertension- Angel Gordon blood pressure is well controlled.  Electrolytes are normal. -     Basic metabolic panel; Future -     CBC with Differential/Platelet; Future -     CBC with Differential/Platelet -     Basic metabolic panel  Type 2 diabetes mellitus with complication, without long-term current use of insulin ([place])- Angel Gordon blood sugar is adequately well controlled.  Will continue the combination of Metformin and the SGLT2 inhibitor. -     Basic metabolic panel; Future -     Hemoglobin A1c; Future -     Hemoglobin A1c -     Basic metabolic panel  Stage 3a chronic kidney disease ([place])- Angel Gordon renal function is stable.  Will continue the SGLT2 inhibitor.  Paraproteinemia- Will screen  Angel Gordon for a malignant transformation.  She is asymptomatic with respect to this. -     Protein electrophoresis, serum; Future -     Protein electrophoresis, serum  Dysfunctional uterine bleeding -     Ambulatory referral to Gynecology   I am having [name] maintain Angel Gordon metFORMIN, FreeStyle Libre 14 Day Sensor, Farxiga, atorvastatin, fluticasone, diclofenac, and diclofenac Sodium.  No orders of the defined types were placed in this encounter.    Follow-up: No follow-ups on file.  Scarlette Calico, MD

## 2020-03-19 ENCOUNTER — Encounter: Payer: Self-pay | Admitting: Internal Medicine

## 2020-03-21 LAB — PROTEIN ELECTROPHORESIS, SERUM
Albumin ELP: 3.4 g/dL — ABNORMAL LOW (ref 3.8–4.8)
Alpha 1: 0.3 g/dL (ref 0.2–0.3)
Alpha 2: 0.9 g/dL (ref 0.5–0.9)
Beta 2: 0.8 g/dL — ABNORMAL HIGH (ref 0.2–0.5)
Beta Globulin: 0.6 g/dL (ref 0.4–0.6)
Gamma Globulin: 1.8 g/dL — ABNORMAL HIGH (ref 0.8–1.7)
Total Protein: 7.8 g/dL (ref 6.1–8.1)

## 2020-03-22 ENCOUNTER — Encounter: Payer: Self-pay | Admitting: Gastroenterology

## 2020-03-22 ENCOUNTER — Encounter: Payer: Self-pay | Admitting: Internal Medicine

## 2020-03-22 ENCOUNTER — Other Ambulatory Visit: Payer: Self-pay | Admitting: Internal Medicine

## 2020-03-22 DIAGNOSIS — E118 Type 2 diabetes mellitus with unspecified complications: Secondary | ICD-10-CM

## 2020-03-22 DIAGNOSIS — N1831 Chronic kidney disease, stage 3a: Secondary | ICD-10-CM

## 2020-03-22 MED ORDER — KERENDIA 10 MG PO TABS
1.0000 | ORAL_TABLET | Freq: Every day | ORAL | 0 refills | Status: DC
Start: 2020-03-22 — End: 2020-04-26

## 2020-03-23 ENCOUNTER — Telehealth: Payer: Self-pay

## 2020-03-23 NOTE — Telephone Encounter (Signed)
Key: Angel Gordon

## 2020-03-24 NOTE — Telephone Encounter (Signed)
PA was denied

## 2020-03-28 ENCOUNTER — Encounter: Payer: Self-pay | Admitting: Gastroenterology

## 2020-03-28 ENCOUNTER — Other Ambulatory Visit: Payer: Self-pay

## 2020-03-28 ENCOUNTER — Ambulatory Visit (AMBULATORY_SURGERY_CENTER): Payer: BC Managed Care – PPO | Admitting: Gastroenterology

## 2020-03-28 VITALS — BP 101/63 | HR 76 | Temp 97.1°F | Resp 19 | Ht 59.0 in | Wt 175.0 lb

## 2020-03-28 DIAGNOSIS — K621 Rectal polyp: Secondary | ICD-10-CM | POA: Diagnosis not present

## 2020-03-28 DIAGNOSIS — D128 Benign neoplasm of rectum: Secondary | ICD-10-CM

## 2020-03-28 DIAGNOSIS — Z1211 Encounter for screening for malignant neoplasm of colon: Secondary | ICD-10-CM

## 2020-03-28 LAB — HM COLONOSCOPY

## 2020-03-28 MED ORDER — SODIUM CHLORIDE 0.9 % IV SOLN: 500.0000 mL | INTRAVENOUS | Status: DC

## 2020-03-28 NOTE — Op Note (Signed)
[facility] Patient Name: Angel Gordon Procedure Date: 03/28/2020 9:24 AM MRN: [id number] Endoscopist: Mallie Mussel L. Loletha Carrow , MD Age: 65 Referring MD:  Date of Birth: 11-Jun-1955 Gender: Female Account #: [id number] Procedure:                Colonoscopy Indications:              Screening for colorectal malignant neoplasm (no                            polyps on last colonoscopy 12/2009) Medicines:                Monitored Anesthesia Care Procedure:                Pre-Anesthesia Assessment:                           - Prior to the procedure, a History and Physical                            was performed, and patient medications and                            allergies were reviewed. The patient's tolerance of                            previous anesthesia was also reviewed. The risks                            and benefits of the procedure and the sedation                            options and risks were discussed with the patient.                            All questions were answered, and informed consent                            was obtained. Prior Anticoagulants: The patient has                            taken no previous anticoagulant or antiplatelet                            agents. ASA Grade Assessment: II - A patient with                            mild systemic disease. After reviewing the risks                            and benefits, the patient was deemed in                            satisfactory condition to undergo the procedure.  After obtaining informed consent, the colonoscope                            was passed under direct vision. Throughout the                            procedure, the patient's blood pressure, pulse, and                            oxygen saturations were monitored continuously. The                            Olympus CF-[id number]) Colonoscope was                            introduced through the anus  and advanced to the the                            cecum, identified by appendiceal orifice and                            ileocecal valve. The colonoscopy was performed                            without difficulty. The patient tolerated the                            procedure well. The quality of the bowel                            preparation was excellent. The ileocecal valve,                            appendiceal orifice, and rectum were photographed.                            The bowel preparation used was Plenvu. Scope In: 9:29:56 AM Scope Out: 9:42:17 AM Scope Withdrawal Time: 0 hours 9 minutes 34 seconds  Total Procedure Duration: 0 hours 12 minutes 21 seconds  Findings:                 The perianal and digital rectal examinations were                            normal.                           A few small-mouthed diverticula were found in the                            sigmoid colon.                           A diminutive polyp was found in the rectum. The  polyp was semi-sessile. The polyp was removed with                            a piecemeal technique using a cold biopsy forceps.                            Resection and retrieval were complete.                           The exam was otherwise without abnormality on                            direct and retroflexion views. Complications:            No immediate complications. Estimated Blood Loss:     Estimated blood loss was minimal. Impression:               - Diverticulosis in the sigmoid colon.                           - One diminutive polyp in the rectum, removed                            piecemeal using a cold biopsy forceps. Resected and                            retrieved.                           - The examination was otherwise normal on direct                            and retroflexion views. Recommendation:           - Patient has a contact number available for                             emergencies. The signs and symptoms of potential                            delayed complications were discussed with the                            patient. Return to normal activities tomorrow.                            Written discharge instructions were provided to the                            patient.                           - Resume previous diet.                           - Continue present medications.                           -  Await pathology results.                           - Repeat colonoscopy is recommended for                            surveillance. The colonoscopy date will be                            determined after pathology results from today's                            exam become available for review. Anyah Swallow L. Loletha Carrow, MD 03/28/2020 9:46:01 AM This report has been signed electronically.

## 2020-03-28 NOTE — Patient Instructions (Signed)
Handout given:  Polyps Resume previous diet Continue current medications Await pathology results  YOU HAD AN ENDOSCOPIC PROCEDURE TODAY AT THE [facility]:   Refer to the procedure report that was given to you for any specific questions about what was found during the examination.  If the procedure report does not answer your questions, please call your gastroenterologist to clarify.  If you requested that your care partner not be given the details of your procedure findings, then the procedure report has been included in a sealed envelope for you to review at your convenience later.  YOU SHOULD EXPECT: Some feelings of bloating in the abdomen. Passage of more gas than usual.  Walking can help get rid of the air that was put into your GI tract during the procedure and reduce the bloating. If you had a lower endoscopy (such as a colonoscopy or flexible sigmoidoscopy) you may notice spotting of blood in your stool or on the toilet paper. If you underwent a bowel prep for your procedure, you may not have a normal bowel movement for a few days.  Please Note:  You might notice some irritation and congestion in your nose or some drainage.  This is from the oxygen used during your procedure.  There is no need for concern and it should clear up in a day or so.  SYMPTOMS TO REPORT IMMEDIATELY:   Following lower endoscopy (colonoscopy or flexible sigmoidoscopy):  Excessive amounts of blood in the stool  Significant tenderness or worsening of abdominal pains  Swelling of the abdomen that is new, acute  Fever of 100F or higher  For urgent or emergent issues, a gastroenterologist can be reached at any hour by calling [phone]. Do not use MyChart messaging for urgent concerns.    DIET:  We do recommend a small meal at first, but then you may proceed to your regular diet.  Drink plenty of fluids but you should avoid alcoholic beverages for 24 hours.  ACTIVITY:  You should plan to take  it easy for the rest of today and you should NOT DRIVE or use heavy machinery until tomorrow (because of the sedation medicines used during the test).    FOLLOW UP: Our staff will call the number listed on your records 48-72 hours following your procedure to check on you and address any questions or concerns that you may have regarding the information given to you following your procedure. If we do not reach you, we will leave a message.  We will attempt to reach you two times.  During this call, we will ask if you have developed any symptoms of COVID 19. If you develop any symptoms (ie: fever, flu-like symptoms, shortness of breath, cough etc.) before then, please call [phone].  If you test positive for Covid 19 in the 2 weeks post procedure, please call and report this information to us.    If any biopsies were taken you will be contacted by phone or by letter within the next 1-3 weeks.  Please call us at [phone] if you have not heard about the biopsies in 3 weeks.   SIGNATURES/CONFIDENTIALITY: You and/or your care partner have signed paperwork which will be entered into your electronic medical record.  These signatures attest to the fact that that the information above on your After Visit Summary has been reviewed and is understood.  Full responsibility of the confidentiality of this discharge information lies with you and/or your care-partner. 

## 2020-03-28 NOTE — Progress Notes (Signed)
Called to room to assist during endoscopic procedure.  Patient ID and intended procedure confirmed with present staff. Received instructions for my participation in the procedure from the performing physician.  

## 2020-03-28 NOTE — Progress Notes (Signed)
Report given to PACU, vss 

## 2020-03-28 NOTE — Progress Notes (Signed)
Pt's states no medical or surgical changes since previsit or office visit. 

## 2020-03-29 ENCOUNTER — Encounter: Payer: Self-pay | Admitting: Internal Medicine

## 2020-03-29 NOTE — Telephone Encounter (Signed)
Mecaela w/ Access services called and is requesting a call back in regards to the PA. She said that the forms they received were missing the doctors signature. She can be reached at [phone]. Please advise

## 2020-03-29 NOTE — Telephone Encounter (Signed)
PA was submitted on CoverMyMeds. Signature was not needed.

## 2020-03-30 ENCOUNTER — Telehealth: Payer: Self-pay | Admitting: *Deleted

## 2020-03-30 NOTE — Telephone Encounter (Signed)
  Follow up Call-  Call back number 03/28/2020  Post procedure Call Back phone  # [phone]  Permission to leave phone message Yes  Some recent data might be hidden     Patient questions:  Do you have a fever, pain , or abdominal swelling? No. Pain Score  0 *  Have you tolerated food without any problems? Yes.    Have you been able to return to your normal activities? Yes.    Do you have any questions about your discharge instructions: Diet   No. Medications  No. Follow up visit  No.  Do you have questions or concerns about your Care? No.  Actions: * If pain score is 4 or above: No action needed, pain <4.

## 2020-04-04 ENCOUNTER — Encounter: Payer: Self-pay | Admitting: Gastroenterology

## 2020-04-05 ENCOUNTER — Other Ambulatory Visit: Payer: Self-pay | Admitting: Internal Medicine

## 2020-04-05 DIAGNOSIS — E118 Type 2 diabetes mellitus with unspecified complications: Secondary | ICD-10-CM

## 2020-04-12 ENCOUNTER — Other Ambulatory Visit: Payer: Self-pay | Admitting: Internal Medicine

## 2020-04-12 DIAGNOSIS — E785 Hyperlipidemia, unspecified: Secondary | ICD-10-CM

## 2020-04-25 ENCOUNTER — Other Ambulatory Visit: Payer: Self-pay | Admitting: Internal Medicine

## 2020-04-25 DIAGNOSIS — E118 Type 2 diabetes mellitus with unspecified complications: Secondary | ICD-10-CM

## 2020-04-25 DIAGNOSIS — N1831 Chronic kidney disease, stage 3a: Secondary | ICD-10-CM

## 2020-04-26 ENCOUNTER — Other Ambulatory Visit: Payer: Self-pay | Admitting: Internal Medicine

## 2020-04-26 DIAGNOSIS — N1831 Chronic kidney disease, stage 3a: Secondary | ICD-10-CM

## 2020-04-26 DIAGNOSIS — E118 Type 2 diabetes mellitus with unspecified complications: Secondary | ICD-10-CM

## 2020-04-26 MED ORDER — KERENDIA 20 MG PO TABS: 1.0000 | ORAL_TABLET | Freq: Every day | ORAL | 0 refills | Status: DC

## 2020-04-27 ENCOUNTER — Other Ambulatory Visit: Payer: Self-pay | Admitting: Internal Medicine

## 2020-04-27 DIAGNOSIS — E118 Type 2 diabetes mellitus with unspecified complications: Secondary | ICD-10-CM

## 2020-05-02 ENCOUNTER — Other Ambulatory Visit: Payer: Self-pay | Admitting: Internal Medicine

## 2020-05-02 DIAGNOSIS — E118 Type 2 diabetes mellitus with unspecified complications: Secondary | ICD-10-CM

## 2020-05-02 DIAGNOSIS — J301 Allergic rhinitis due to pollen: Secondary | ICD-10-CM

## 2020-05-02 MED ORDER — FLUTICASONE PROPIONATE 50 MCG/ACT NA SUSP
2.0000 | Freq: Every day | NASAL | 1 refills | Status: DC
Start: 2020-05-02 — End: 2020-10-07

## 2020-05-02 MED ORDER — DAPAGLIFLOZIN PROPANEDIOL 10 MG PO TABS
10.0000 mg | ORAL_TABLET | Freq: Every day | ORAL | 1 refills | Status: DC
Start: 2020-05-02 — End: 2020-10-06

## 2020-06-07 ENCOUNTER — Encounter: Payer: Self-pay | Admitting: Internal Medicine

## 2020-06-28 LAB — HM PAP SMEAR

## 2020-07-14 ENCOUNTER — Encounter: Payer: Self-pay | Admitting: Internal Medicine

## 2020-07-15 ENCOUNTER — Other Ambulatory Visit: Payer: Self-pay

## 2020-07-15 ENCOUNTER — Ambulatory Visit (INDEPENDENT_AMBULATORY_CARE_PROVIDER_SITE_OTHER): Payer: BC Managed Care – PPO | Admitting: Family Medicine

## 2020-07-15 ENCOUNTER — Encounter: Payer: Self-pay | Admitting: Family Medicine

## 2020-07-15 VITALS — BP 110/62 | HR 62 | Temp 97.5°F | Resp 18 | Ht 59.0 in | Wt 166.2 lb

## 2020-07-15 DIAGNOSIS — R300 Dysuria: Secondary | ICD-10-CM | POA: Insufficient documentation

## 2020-07-15 LAB — POCT URINALYSIS DIPSTICK
Bilirubin, UA: NEGATIVE
Glucose, UA: POSITIVE — AB
Ketones, UA: NEGATIVE
Leukocytes, UA: NEGATIVE
Nitrite, UA: NEGATIVE
Protein, UA: NEGATIVE
Spec Grav, UA: 1.02 (ref 1.010–1.025)
Urobilinogen, UA: NEGATIVE E.U./dL — AB
pH, UA: 5.5 (ref 5.0–8.0)

## 2020-07-15 MED ORDER — CEPHALEXIN 500 MG PO CAPS: 500.0000 mg | ORAL_CAPSULE | Freq: Two times a day (BID) | ORAL | 0 refills | Status: DC

## 2020-07-15 MED ORDER — PHENAZOPYRIDINE HCL 100 MG PO TABS: 100.0000 mg | ORAL_TABLET | Freq: Three times a day (TID) | ORAL | 0 refills | Status: DC | PRN

## 2020-07-15 MED ORDER — FLUCONAZOLE 150 MG PO TABS: ORAL_TABLET | ORAL | 0 refills | Status: DC

## 2020-07-15 NOTE — Progress Notes (Signed)
Patient ID: Angel Gordon, female    DOB: Aug 13, 1955  Age: 65 y.o. MRN: [id number]    Subjective:  Subjective  HPI Angel Gordon presents for an office visit today. She complains of dysuria, hematuria, vaginal discharge, and frequency x 5 days.  She endorses taking Azo x 3 days, however it didn't relieved her symptoms and she is unsure is the medication is expired. Pt has a Mhx of CKD.  She reports that she normally have blood present in her urine during urinalysis.  She endorses taking 20 mg of kerendia PO Daily for her dx of CKD with T2DM. Urinalysis  Urine dipstick shows + glucose,  + urobilirubin 07/15/2020      Component Value Date/Time   COLORURINE YELLOW 07/23/2019 1608   APPEARANCEUR CLEAR 07/23/2019 1608   LABSPEC 1.020 07/23/2019 1608   PHURINE 6.0 07/23/2019 1608   GLUCOSEU >=1000 (A) 07/23/2019 1608   HGBUR MODERATE (A) 07/23/2019 1608   HGBUR moderate 02/09/2008 1251   BILIRUBINUR neg 07/15/2020 1046   KETONESUR NEGATIVE 07/23/2019 1608   PROTEINUR Negative 07/15/2020 1046   UROBILINOGEN negative (A) 07/15/2020 1046   UROBILINOGEN 0.2 07/23/2019 1608   NITRITE neg 07/15/2020 1046   NITRITE NEGATIVE 07/23/2019 1608   LEUKOCYTESUR Negative 07/15/2020 1046   LEUKOCYTESUR NEGATIVE 07/23/2019 1608  She denies any chest pain, SOB, fever, abdominal pain, cough, chills, sore throat, urinary incontinence, back pain, HA, or N/V/D at this time.   Review of Systems  Constitutional:  Negative for chills, fatigue and fever.  HENT:  Negative for ear pain, rhinorrhea, sinus pressure, sinus pain, sore throat and tinnitus.   Eyes:  Negative for pain.  Respiratory:  Negative for cough, shortness of breath and wheezing.   Cardiovascular:  Negative for chest pain.  Gastrointestinal:  Negative for abdominal pain, anal bleeding, constipation, diarrhea, nausea and vomiting.  Genitourinary:  Positive for dysuria (burning), frequency, hematuria and vaginal discharge (minimal).  Negative for flank pain.  Musculoskeletal:  Negative for back pain and neck pain.  Skin:  Negative for rash.  Neurological:  Negative for seizures, weakness, light-headedness, numbness and headaches.   History Past Medical History:  Diagnosis Date   Allergy    seasonal   Post-menopausal bleeding 03/16/2019   pt reports going through menopause in her early 26s, no periods for many years, then states now she has a "return of her periods" and is not in menopause.  let pt know to call her gyne to report this bleeding, that it is considered bleeding not her period since she is post menopausal, pt verb understanding.   Type II or unspecified type diabetes mellitus without mention of complication, not stated as uncontrolled    metformin 500mg  bid-    She has a past surgical history that includes Foot surgery (2002); Tubal ligation; Dilation and curettage of uterus; Colonoscopy (2010); and Hysteroscopy with D & C (08/17/2011).   Her family history includes Cancer in her maternal grandmother and mother; Colon cancer in her maternal grandmother; Colon polyps in her maternal grandmother; Diabetes in her brother; Heart disease in her mother; Hypertension in her mother and son; Kidney disease in her maternal grandmother and mother; Liver cancer in her brother; Stroke in her maternal grandmother and mother; Thyroid disease in her mother.She reports that she has never smoked. She has never used smokeless tobacco. She reports that she does not drink alcohol and does not use drugs.  Current Outpatient Medications on File Prior to Visit  Medication Sig Dispense Refill  atorvastatin (LIPITOR) 10 MG tablet TAKE 1 TABLET DAILY 90 tablet 1   Continuous Blood Gluc Sensor (FREESTYLE LIBRE 14 DAY SENSOR) MISC USE AS INSTRUCTED. 90 each 1   dapagliflozin propanediol (FARXIGA) 10 MG TABS tablet Take 1 tablet (10 mg total) by mouth daily. 90 tablet 1   diclofenac (VOLTAREN) 75 MG EC tablet Take 1 tablet (75 mg total)  by mouth 2 (two) times daily. 50 tablet 2   diclofenac Sodium (VOLTAREN) 1 % GEL diclofenac 1 % topical gel  APPLY 2 GRAMS TO AFFECTED AREA 4 TIMES A DAY     Finerenone (KERENDIA) 20 MG TABS Take 1 tablet by mouth daily. 90 tablet 0   fluticasone (FLONASE) 50 MCG/ACT nasal spray Place 2 sprays into both nostrils daily. 48 mL 1   metFORMIN (GLUCOPHAGE) 500 MG tablet TAKE 1 TABLET TWICE A DAY 180 tablet 1   No current facility-administered medications on file prior to visit.     Objective:  Objective  Physical Exam Vitals and nursing note reviewed.  Constitutional:      General: She is not in acute distress.    Appearance: Normal appearance. She is well-developed. She is not ill-appearing.  HENT:     Head: Normocephalic and atraumatic.     Right Ear: External ear normal.     Left Ear: External ear normal.     Nose: Nose normal.  Eyes:     General:        Right eye: No discharge.        Left eye: No discharge.     Extraocular Movements: Extraocular movements intact.     Pupils: Pupils are equal, round, and reactive to light.  Cardiovascular:     Rate and Rhythm: Normal rate and regular rhythm.     Pulses: Normal pulses.     Heart sounds: Normal heart sounds. No murmur heard.   No friction rub. No gallop.  Pulmonary:     Effort: Pulmonary effort is normal. No respiratory distress.     Breath sounds: Normal breath sounds. No stridor. No wheezing, rhonchi or rales.  Chest:     Chest wall: No tenderness.  Abdominal:     General: Bowel sounds are normal. There is no distension.     Palpations: Abdomen is soft. There is no mass.     Tenderness: There is no abdominal tenderness. There is no right CVA tenderness, left CVA tenderness, guarding or rebound.     Hernia: No hernia is present.  Musculoskeletal:        General: No tenderness. Normal range of motion.     Cervical back: Normal range of motion and neck supple.     Right lower leg: No edema.     Left lower leg: No edema.   Skin:    General: Skin is warm and dry.  Neurological:     Mental Status: She is alert and oriented to person, place, and time.  Psychiatric:        Behavior: Behavior normal.        Thought Content: Thought content normal.   BP 110/62 (BP Location: Left Arm, Patient Position: Sitting, Cuff Size: Large)   Pulse 62   Temp (!) 97.5 F (36.4 C) (Oral)   Resp 18   Ht 4\' 11"  (1.499 m)   Wt 166 lb 3.2 oz (75.4 kg)   LMP 02/23/2011 Comment: pt states she has had recent return of "periods" after going through menopause in her early 1s, instruct pt  to call her Gyn to report this, pt verb understanding   SpO2 99%   BMI 33.57 kg/m  Wt Readings from Last 3 Encounters:  07/15/20 166 lb 3.2 oz (75.4 kg)  03/28/20 175 lb (79.4 kg)  03/18/20 175 lb (79.4 kg)     Lab Results  Component Value Date   WBC 5.7 03/18/2020   HGB 12.6 03/18/2020   HCT 39.3 03/18/2020   PLT 174.0 03/18/2020   GLUCOSE 86 03/18/2020   CHOL 109 07/23/2019   TRIG 50.0 07/23/2019   HDL 57.30 07/23/2019   LDLCALC 42 07/23/2019   ALT 21 07/23/2019   AST 23 07/23/2019   NA 139 03/18/2020   K 4.1 03/18/2020   CL 104 03/18/2020   CREATININE 1.09 03/18/2020   BUN 17 03/18/2020   CO2 28 03/18/2020   TSH 2.40 07/23/2019   HGBA1C 7.0 (H) 03/18/2020   MICROALBUR <0.7 07/23/2019    DG Chest 2 View  Result Date: 07/21/2018 CLINICAL DATA:  Chest wall pain status post fall 3 weeks ago EXAM: CHEST - 2 VIEW COMPARISON:  07/27/2016 FINDINGS: The heart size and mediastinal contours are within normal limits. Both lungs are clear. The visualized skeletal structures are unremarkable. IMPRESSION: No active cardiopulmonary disease. Electronically Signed   By: Kathreen Devoid   On: 07/21/2018 10:17     Assessment & Plan:  Plan   Meds ordered this encounter  Medications   cephALEXin (KEFLEX) 500 MG capsule    Sig: Take 1 capsule (500 mg total) by mouth 2 (two) times daily.    Dispense:  14 capsule    Refill:  0    fluconazole (DIFLUCAN) 150 MG tablet    Sig: 1 po x1, may repeat in 3 days prn    Dispense:  2 tablet    Refill:  0   phenazopyridine (PYRIDIUM) 100 MG tablet    Sig: Take 1 tablet (100 mg total) by mouth 3 (three) times daily as needed for pain.    Dispense:  6 tablet    Refill:  0    Problem List Items Addressed This Visit       Unprioritized   Burning with urination - Primary    Culture pending  abx per orders  ua --no leuk or nitrates Suspect dysuria maybe from [place] not uti  Pt has app with pcp in next few weeks        Relevant Medications   cephALEXin (KEFLEX) 500 MG capsule   fluconazole (DIFLUCAN) 150 MG tablet   phenazopyridine (PYRIDIUM) 100 MG tablet   Other Relevant Orders   POCT Urinalysis Dipstick (Completed)   Urine Culture    Follow-up: Return if symptoms worsen or fail to improve.   I,Gordon Zheng,acting as a Education administrator for [employer], DO.,have documented all relevant documentation on the behalf of Ann Held, DO,as directed by  Ann Held, DO while in the presence of [place], DO, have reviewed all documentation for this visit. The documentation on 07/15/20 for the exam, diagnosis, procedures, and orders are all accurate and complete.

## 2020-07-15 NOTE — Assessment & Plan Note (Signed)
Culture pending  abx per orders  ua --no leuk or nitrates Suspect dysuria maybe from [place] not uti  Pt has app with pcp in next few weeks

## 2020-07-15 NOTE — Patient Instructions (Signed)
Dysuria ?Dysuria is pain or discomfort during urination. The pain or discomfort may be felt in the part of the body that drains urine from the bladder (urethra) or in the surrounding tissue of the genitals. The pain may also be felt in the groin area, lower abdomen, or lower back. ?You may have to urinate frequently or have the sudden feeling that you have to urinate (urgency). Dysuria can affect anyone, but it is more common in females. Dysuria can be caused by many different things, including: ?Urinary tract infection. ?Kidney stones or bladder stones. ?Certain STIs (sexually transmitted infections), such as chlamydia. ?Dehydration. ?Inflammation of the tissues of the vagina. ?Use of certain medicines. ?Use of certain soaps or scented products that cause irritation. ?Follow these instructions at home: ?Medicines ?Take over-the-counter and prescription medicines only as told by your health care provider. ?If you were prescribed an antibiotic medicine, take it as told by your health care provider. Do not stop taking the antibiotic even if you start to feel better. ?Eating and drinking ? ?Drink enough fluid to keep your urine pale yellow. ?Avoid caffeinated beverages, tea, and alcohol. These beverages can irritate the bladder and make dysuria worse. In males, alcohol may irritate the prostate. ?General instructions ?Watch your condition for any changes. ?Urinate often. Avoid holding urine for long periods of time. ?If you are female, you should wipe from front to back after urinating or having a bowel movement. Use each piece of toilet paper only once. ?Empty your bladder after sex. ?Keep all follow-up visits. This is important. ?If you had any tests done to find the cause of dysuria, it is up to you to get your test results. Ask your health care provider, or the department that is doing the test, when your results will be ready. ?Contact a health care provider if: ?You have a fever. ?You develop pain in your back or  sides. ?You have nausea or vomiting. ?You have blood in your urine. ?You are not urinating as often as you usually do. ?Get help right away if: ?Your pain is severe and not relieved with medicines. ?You cannot eat or drink without vomiting. ?You are confused. ?You have a rapid heartbeat while resting. ?You have shaking or chills. ?You feel extremely weak. ?Summary ?Dysuria is pain or discomfort while urinating. Many different conditions can lead to dysuria. ?If you have dysuria, you may have to urinate frequently or have the sudden feeling that you have to urinate (urgency). ?Watch your condition for any changes. Keep all follow-up visits. ?Make sure that you urinate often and drink enough fluid to keep your urine pale yellow. ?This information is not intended to replace advice given to you by your health care provider. Make sure you discuss any questions you have with your health care provider. ?Document Revised: 08/21/2019 Document Reviewed: 08/21/2019 ?Elsevier Patient Education ? 2022 Elsevier Inc. ? ?

## 2020-07-16 LAB — URINE CULTURE
MICRO NUMBER:: 12047614
Result:: NO GROWTH
SPECIMEN QUALITY:: ADEQUATE

## 2020-07-19 ENCOUNTER — Ambulatory Visit: Payer: BC Managed Care – PPO | Admitting: Internal Medicine

## 2020-07-23 ENCOUNTER — Other Ambulatory Visit: Payer: Self-pay | Admitting: Internal Medicine

## 2020-07-23 DIAGNOSIS — E118 Type 2 diabetes mellitus with unspecified complications: Secondary | ICD-10-CM

## 2020-07-23 DIAGNOSIS — N1831 Chronic kidney disease, stage 3a: Secondary | ICD-10-CM

## 2020-07-26 ENCOUNTER — Other Ambulatory Visit: Payer: Self-pay

## 2020-07-26 ENCOUNTER — Ambulatory Visit (INDEPENDENT_AMBULATORY_CARE_PROVIDER_SITE_OTHER): Payer: BC Managed Care – PPO | Admitting: Internal Medicine

## 2020-07-26 ENCOUNTER — Encounter: Payer: Self-pay | Admitting: Internal Medicine

## 2020-07-26 VITALS — BP 128/82 | HR 73 | Temp 98.2°F | Ht 59.0 in | Wt 166.0 lb

## 2020-07-26 DIAGNOSIS — Z0000 Encounter for general adult medical examination without abnormal findings: Secondary | ICD-10-CM

## 2020-07-26 DIAGNOSIS — N1831 Chronic kidney disease, stage 3a: Secondary | ICD-10-CM

## 2020-07-26 DIAGNOSIS — E118 Type 2 diabetes mellitus with unspecified complications: Secondary | ICD-10-CM | POA: Diagnosis not present

## 2020-07-26 DIAGNOSIS — K760 Fatty (change of) liver, not elsewhere classified: Secondary | ICD-10-CM

## 2020-07-26 DIAGNOSIS — I10 Essential (primary) hypertension: Secondary | ICD-10-CM | POA: Diagnosis not present

## 2020-07-26 DIAGNOSIS — E785 Hyperlipidemia, unspecified: Secondary | ICD-10-CM

## 2020-07-26 DIAGNOSIS — D892 Hypergammaglobulinemia, unspecified: Secondary | ICD-10-CM | POA: Diagnosis not present

## 2020-07-26 LAB — CBC WITH DIFFERENTIAL/PLATELET
Basophils Absolute: 0.1 10*3/uL (ref 0.0–0.1)
Basophils Relative: 0.8 % (ref 0.0–3.0)
Eosinophils Absolute: 0.5 10*3/uL (ref 0.0–0.7)
Eosinophils Relative: 6.4 % — ABNORMAL HIGH (ref 0.0–5.0)
HCT: 42.1 % (ref 36.0–46.0)
Hemoglobin: 13.5 g/dL (ref 12.0–15.0)
Lymphocytes Relative: 53.6 % — ABNORMAL HIGH (ref 12.0–46.0)
Lymphs Abs: 4 10*3/uL (ref 0.7–4.0)
MCHC: 32 g/dL (ref 30.0–36.0)
MCV: 87.5 fl (ref 78.0–100.0)
Monocytes Absolute: 0.5 10*3/uL (ref 0.1–1.0)
Monocytes Relative: 7.3 % (ref 3.0–12.0)
Neutro Abs: 2.4 10*3/uL (ref 1.4–7.7)
Neutrophils Relative %: 31.9 % — ABNORMAL LOW (ref 43.0–77.0)
Platelets: 204 10*3/uL (ref 150.0–400.0)
RBC: 4.81 Mil/uL (ref 3.87–5.11)
RDW: 14.4 % (ref 11.5–15.5)
WBC: 7.5 10*3/uL (ref 4.0–10.5)

## 2020-07-26 LAB — BASIC METABOLIC PANEL
BUN: 19 mg/dL (ref 6–23)
CO2: 27 mEq/L (ref 19–32)
Calcium: 10.3 mg/dL (ref 8.4–10.5)
Chloride: 101 mEq/L (ref 96–112)
Creatinine, Ser: 1.05 mg/dL (ref 0.40–1.20)
GFR: 56.9 mL/min — ABNORMAL LOW (ref >60.00)
Glucose, Bld: 83 mg/dL (ref 70–99)
Potassium: 4.2 mEq/L (ref 3.5–5.1)
Sodium: 138 mEq/L (ref 135–145)

## 2020-07-26 LAB — LIPID PANEL
Cholesterol: 106 mg/dL (ref 0–200)
HDL: 51.1 mg/dL (ref >39.00)
LDL Cholesterol: 42 mg/dL (ref 0–99)
NonHDL: 55.28
Total CHOL/HDL Ratio: 2
Triglycerides: 68 mg/dL (ref 0.0–149.0)
VLDL: 13.6 mg/dL (ref 0.0–40.0)

## 2020-07-26 LAB — URINALYSIS, ROUTINE W REFLEX MICROSCOPIC
Bilirubin Urine: NEGATIVE
Ketones, ur: NEGATIVE
Leukocytes,Ua: NEGATIVE
Nitrite: NEGATIVE
Specific Gravity, Urine: 1.025 (ref 1.000–1.030)
Total Protein, Urine: NEGATIVE
Urine Glucose: >=1000 — AB
Urobilinogen, UA: 0.2 (ref 0.0–1.0)
WBC, UA: NONE SEEN (ref 0–?)
pH: 5.5 (ref 5.0–8.0)

## 2020-07-26 LAB — HEPATIC FUNCTION PANEL
ALT: 57 U/L — ABNORMAL HIGH (ref 0–35)
AST: 51 U/L — ABNORMAL HIGH (ref 0–37)
Albumin: 4.2 g/dL (ref 3.5–5.2)
Alkaline Phosphatase: 88 U/L (ref 39–117)
Bilirubin, Direct: 0.1 mg/dL (ref 0.0–0.3)
Total Bilirubin: 0.3 mg/dL (ref 0.2–1.2)
Total Protein: 9 g/dL — ABNORMAL HIGH (ref 6.0–8.3)

## 2020-07-26 LAB — MICROALBUMIN / CREATININE URINE RATIO
Creatinine,U: 68.5 mg/dL
Microalb Creat Ratio: 1 mg/g (ref 0.0–30.0)
Microalb, Ur: <0.7 mg/dL (ref 0.0–1.9)

## 2020-07-26 LAB — HEMOGLOBIN A1C: Hgb A1c MFr Bld: 7.3 % — ABNORMAL HIGH (ref 4.6–6.5)

## 2020-07-26 NOTE — Patient Instructions (Signed)
Health Maintenance, Female Adopting a healthy lifestyle and getting preventive care are important in promoting health and wellness. Ask your health care provider about: The right schedule for you to have regular tests and exams. Things you can do on your own to prevent diseases and keep yourself healthy. What should I know about diet, weight, and exercise? Eat a healthy diet  Eat a diet that includes plenty of vegetables, fruits, low-fat dairy products, and lean protein. Do not eat a lot of foods that are high in solid fats, added sugars, or sodium.  Maintain a healthy weight Body mass index (BMI) is used to identify weight problems. It estimates body fat based on height and weight. Your health care provider can help determineyour BMI and help you achieve or maintain a healthy weight. Get regular exercise Get regular exercise. This is one of the most important things you can do for your health. Most adults should: Exercise for at least 150 minutes each week. The exercise should increase your heart rate and make you sweat (moderate-intensity exercise). Do strengthening exercises at least twice a week. This is in addition to the moderate-intensity exercise. Spend less time sitting. Even light physical activity can be beneficial. Watch cholesterol and blood lipids Have your blood tested for lipids and cholesterol at 63 years of age, then havethis test every 5 years. Have your cholesterol levels checked more often if: Your lipid or cholesterol levels are high. You are older than 63 years of age. You are at high risk for heart disease. What should I know about cancer screening? Depending on your health history and family history, you may need to have cancer screening at various ages. This may include screening for: Breast cancer. Cervical cancer. Colorectal cancer. Skin cancer. Lung cancer. What should I know about heart disease, diabetes, and high blood pressure? Blood pressure and heart  disease High blood pressure causes heart disease and increases the risk of stroke. This is more likely to develop in people who have high blood pressure readings, are of African descent, or are overweight. Have your blood pressure checked: Every 3-5 years if you are 18-39 years of age. Every year if you are 40 years old or older. Diabetes Have regular diabetes screenings. This checks your fasting blood sugar level. Have the screening done: Once every three years after age 40 if you are at a normal weight and have a low risk for diabetes. More often and at a younger age if you are overweight or have a high risk for diabetes. What should I know about preventing infection? Hepatitis B If you have a higher risk for hepatitis B, you should be screened for this virus. Talk with your health care provider to find out if you are at risk forhepatitis B infection. Hepatitis C Testing is recommended for: Everyone born from 1945 through 1965. Anyone with known risk factors for hepatitis C. Sexually transmitted infections (STIs) Get screened for STIs, including gonorrhea and chlamydia, if: You are sexually active and are younger than 63 years of age. You are older than 63 years of age and your health care provider tells you that you are at risk for this type of infection. Your sexual activity has changed since you were last screened, and you are at increased risk for chlamydia or gonorrhea. Ask your health care provider if you are at risk. Ask your health care provider about whether you are at high risk for HIV. Your health care provider may recommend a prescription medicine to help   prevent HIV infection. If you choose to take medicine to prevent HIV, you should first get tested for HIV. You should then be tested every 3 months for as long as you are taking the medicine. Pregnancy If you are about to stop having your period (premenopausal) and you may become pregnant, seek counseling before you get  pregnant. Take 400 to 800 micrograms (mcg) of folic acid every day if you become pregnant. Ask for birth control (contraception) if you want to prevent pregnancy. Osteoporosis and menopause Osteoporosis is a disease in which the bones lose minerals and strength with aging. This can result in bone fractures. If you are 65 years old or older, or if you are at risk for osteoporosis and fractures, ask your health care provider if you should: Be screened for bone loss. Take a calcium or vitamin D supplement to lower your risk of fractures. Be given hormone replacement therapy (HRT) to treat symptoms of menopause. Follow these instructions at home: Lifestyle Do not use any products that contain nicotine or tobacco, such as cigarettes, e-cigarettes, and chewing tobacco. If you need help quitting, ask your health care provider. Do not use street drugs. Do not share needles. Ask your health care provider for help if you need support or information about quitting drugs. Alcohol use Do not drink alcohol if: Your health care provider tells you not to drink. You are pregnant, may be pregnant, or are planning to become pregnant. If you drink alcohol: Limit how much you use to 0-1 drink a day. Limit intake if you are breastfeeding. Be aware of how much alcohol is in your drink. In the [place] one drink equals one 12 oz bottle of beer (355 mL), one 5 oz glass of wine (148 mL), or one 1 oz glass of hard liquor (44 mL). General instructions Schedule regular health, dental, and eye exams. Stay current with your vaccines. Tell your health care provider if: You often feel depressed. You have ever been abused or do not feel safe at home. Summary Adopting a healthy lifestyle and getting preventive care are important in promoting health and wellness. Follow your health care provider's instructions about healthy diet, exercising, and getting tested or screened for diseases. Follow your health care provider's  instructions on monitoring your cholesterol and blood pressure. This information is not intended to replace advice given to you by your health care provider. Make sure you discuss any questions you have with your healthcare provider. Document Revised: 01/01/2018 Document Reviewed: 01/01/2018 Elsevier Patient Education  2022 Elsevier Inc.  

## 2020-07-26 NOTE — Progress Notes (Signed)
Subjective:  Patient ID: Angel Gordon, female    DOB: 18-Mar-1955  Age: 65 y.o. MRN: [id number]  CC: Annual Exam and Diabetes  This visit occurred during the SARS-CoV-2 public health emergency.  Safety protocols were in place, including screening questions prior to the visit, additional usage of staff PPE, and extensive cleaning of exam room while observing appropriate contact time as indicated for disinfecting solutions.    HPI Angel Gordon presents for a CPX and f/up -   She was recently treated for UTI.  She tells me that her symptoms have resolved.  She is active and denies any recent episodes of chest pain, shortness of breath, dizziness, lightheadedness, diaphoresis, or polys.  Outpatient Medications Prior to Visit  Medication Sig Dispense Refill   atorvastatin (LIPITOR) 10 MG tablet TAKE 1 TABLET DAILY 90 tablet 1   Continuous Blood Gluc Sensor (FREESTYLE LIBRE 14 DAY SENSOR) MISC USE AS INSTRUCTED. 90 each 1   dapagliflozin propanediol (FARXIGA) 10 MG TABS tablet Take 1 tablet (10 mg total) by mouth daily. 90 tablet 1   diclofenac (VOLTAREN) 75 MG EC tablet Take 1 tablet (75 mg total) by mouth 2 (two) times daily. 50 tablet 2   diclofenac Sodium (VOLTAREN) 1 % GEL diclofenac 1 % topical gel  APPLY 2 GRAMS TO AFFECTED AREA 4 TIMES A DAY     fluticasone (FLONASE) 50 MCG/ACT nasal spray Place 2 sprays into both nostrils daily. 48 mL 1   KERENDIA 20 MG TABS TAKE 1 TABLET BY MOUTH EVERY DAY 30 tablet 2   metFORMIN (GLUCOPHAGE) 500 MG tablet TAKE 1 TABLET TWICE A DAY 180 tablet 1   cephALEXin (KEFLEX) 500 MG capsule Take 1 capsule (500 mg total) by mouth 2 (two) times daily. 14 capsule 0   fluconazole (DIFLUCAN) 150 MG tablet 1 po x1, may repeat in 3 days prn 2 tablet 0   phenazopyridine (PYRIDIUM) 100 MG tablet Take 1 tablet (100 mg total) by mouth 3 (three) times daily as needed for pain. 6 tablet 0   No facility-administered medications prior to visit.    ROS Review of  Systems  Constitutional:  Negative for diaphoresis, fatigue and unexpected weight change.  HENT: Negative.    Eyes:  Negative for visual disturbance.  Respiratory:  Negative for cough, chest tightness, shortness of breath and wheezing.   Cardiovascular:  Negative for chest pain, palpitations and leg swelling.  Gastrointestinal:  Negative for abdominal pain, constipation, diarrhea, nausea and vomiting.  Endocrine: Negative.  Negative for polydipsia, polyphagia and polyuria.  Genitourinary: Negative.  Negative for decreased urine volume, difficulty urinating, dysuria, hematuria, urgency, vaginal bleeding and vaginal discharge.  Musculoskeletal:  Negative for arthralgias and myalgias.  Skin: Negative.   Neurological: Negative.  Negative for dizziness and weakness.  Hematological:  Negative for adenopathy. Does not bruise/bleed easily.  Psychiatric/Behavioral: Negative.     Objective:  BP 128/82 (BP Location: Right Arm, Patient Position: Sitting, Cuff Size: Large)   Pulse 73   Temp 98.2 F (36.8 C) (Oral)   Ht 4\' 11"  (1.499 m)   Wt 166 lb (75.3 kg)   LMP 02/23/2011 Comment: pt states she has had recent return of "periods" after going through menopause in her early 77s, instruct pt to call her Gyn to report this, pt verb understanding   SpO2 98%   BMI 33.53 kg/m   BP Readings from Last 3 Encounters:  07/26/20 128/82  07/15/20 110/62  03/28/20 101/63    Wt Readings from  Last 3 Encounters:  07/26/20 166 lb (75.3 kg)  07/15/20 166 lb 3.2 oz (75.4 kg)  03/28/20 175 lb (79.4 kg)    Physical Exam Vitals reviewed.  HENT:     Nose: Nose normal.     Mouth/Throat:     Mouth: Mucous membranes are moist.  Eyes:     General: No scleral icterus.    Conjunctiva/sclera: Conjunctivae normal.  Cardiovascular:     Rate and Rhythm: Normal rate and regular rhythm.     Heart sounds: No murmur heard. Pulmonary:     Effort: Pulmonary effort is normal.     Breath sounds: No stridor. No  wheezing, rhonchi or rales.  Abdominal:     General: Abdomen is flat. Bowel sounds are normal.     Palpations: There is no hepatomegaly, splenomegaly or mass.     Tenderness: There is no abdominal tenderness.  Musculoskeletal:        General: Normal range of motion.     Cervical back: Neck supple.     Right lower leg: No edema.     Left lower leg: No edema.  Lymphadenopathy:     Cervical: No cervical adenopathy.  Skin:    General: Skin is warm and dry.  Neurological:     General: No focal deficit present.     Mental Status: She is alert.  Psychiatric:        Mood and Affect: Mood normal.        Behavior: Behavior normal.    Lab Results  Component Value Date   WBC 7.5 07/26/2020   HGB 13.5 07/26/2020   HCT 42.1 07/26/2020   PLT 204.0 07/26/2020   GLUCOSE 83 07/26/2020   CHOL 106 07/26/2020   TRIG 68.0 07/26/2020   HDL 51.10 07/26/2020   LDLCALC 42 07/26/2020   ALT 57 (H) 07/26/2020   AST 51 (H) 07/26/2020   NA 138 07/26/2020   K 4.2 07/26/2020   CL 101 07/26/2020   CREATININE 1.05 07/26/2020   BUN 19 07/26/2020   CO2 27 07/26/2020   TSH 2.40 07/23/2019   HGBA1C 7.3 (H) 07/26/2020   MICROALBUR <0.7 07/26/2020    DG Chest 2 View  Result Date: 07/21/2018 CLINICAL DATA:  Chest wall pain status post fall 3 weeks ago EXAM: CHEST - 2 VIEW COMPARISON:  07/27/2016 FINDINGS: The heart size and mediastinal contours are within normal limits. Both lungs are clear. The visualized skeletal structures are unremarkable. IMPRESSION: No active cardiopulmonary disease. Electronically Signed   By: Kathreen Devoid   On: 07/21/2018 10:17    Assessment & Plan:   Angel Gordon was seen today for annual exam and diabetes.  Diagnoses and all orders for this visit:  Essential hypertension- Her blood pressure is adequately well controlled. -     Basic metabolic panel; Future -     CBC with Differential/Platelet; Future -     CBC with Differential/Platelet -     Basic metabolic panel  Fatty  liver disease, nonalcoholic- Will treat with pioglitazone. -     Hepatic function panel; Future -     Hepatic function panel -     pioglitazone (ACTOS) 15 MG tablet; Take 1 tablet (15 mg total) by mouth daily.  Type II diabetes mellitus with manifestations ([place])- Her A1c is at 7.3%.  We will add pioglitazone to the current regimen. -     Basic metabolic panel; Future -     Hemoglobin A1c; Future -     Microalbumin /  creatinine urine ratio; Future -     HM Diabetes Foot Exam -     Microalbumin / creatinine urine ratio -     Hemoglobin A1c -     Basic metabolic panel -     pioglitazone (ACTOS) 15 MG tablet; Take 1 tablet (15 mg total) by mouth daily.  Type 2 diabetes mellitus with complication, without long-term current use of insulin (HCC) -     Basic metabolic panel; Future -     Hemoglobin A1c; Future -     Microalbumin / creatinine urine ratio; Future -     HM Diabetes Foot Exam -     Microalbumin / creatinine urine ratio -     Hemoglobin A1c -     Basic metabolic panel -     pioglitazone (ACTOS) 15 MG tablet; Take 1 tablet (15 mg total) by mouth daily.  Hyperlipidemia with target LDL less than 100- LDL goal achieved. Doing well on the statin  -     Lipid panel; Future -     Hepatic function panel; Future -     Hepatic function panel -     Lipid panel  Paraproteinemia- Will screen for lymphoproliferative disease with an SPEP. -     Protein electrophoresis, serum; Future -     CBC with Differential/Platelet; Future -     CBC with Differential/Platelet -     Protein electrophoresis, serum  Routine health maintenance- Exam completed, labs reviewed, vaccines reviewed-she refused all vaccines today, cancer screenings are up-to-date, patient education was given.  Stage 3a chronic kidney disease ([place])- Her renal function has improved slightly.  Will continue the combination of [place]. -     Basic metabolic panel; Future -     Urinalysis, Routine w reflex microscopic;  Future -     Microalbumin / creatinine urine ratio; Future -     Microalbumin / creatinine urine ratio -     Urinalysis, Routine w reflex microscopic -     Basic metabolic panel  I have discontinued Anderson [place] E. Bordwell's cephALEXin, fluconazole, and phenazopyridine. I am also having her start on pioglitazone. Additionally, I am having her maintain her FreeStyle Libre 14 Day Sensor, diclofenac, diclofenac Sodium, metFORMIN, atorvastatin, dapagliflozin propanediol, fluticasone, and [place].  Meds ordered this encounter  Medications   pioglitazone (ACTOS) 15 MG tablet    Sig: Take 1 tablet (15 mg total) by mouth daily.    Dispense:  90 tablet    Refill:  1      Follow-up: Return in about 6 months (around 01/26/2021).  Scarlette Calico, MD

## 2020-07-27 ENCOUNTER — Encounter: Payer: Self-pay | Admitting: Internal Medicine

## 2020-07-27 MED ORDER — PIOGLITAZONE HCL 15 MG PO TABS: 15.0000 mg | ORAL_TABLET | Freq: Every day | ORAL | 1 refills | Status: DC

## 2020-08-01 ENCOUNTER — Encounter: Payer: Self-pay | Admitting: Internal Medicine

## 2020-08-04 DIAGNOSIS — Z0279 Encounter for issue of other medical certificate: Secondary | ICD-10-CM

## 2020-08-04 NOTE — Telephone Encounter (Signed)
Forms have been completed and faxed back  Copy sent to scan Copy given to charge Original has been filed

## 2020-08-04 NOTE — Telephone Encounter (Signed)
   Angel Gordon is requesting a verbal verification.   Phone: [phone]

## 2020-08-05 NOTE — Telephone Encounter (Signed)
Attempted to return call to CB# listed below but received a message stating that VM is not supported at that number. Will attempt to call again later.

## 2020-08-05 NOTE — Telephone Encounter (Signed)
Attempted CB# again. Received the "not supported" message again. Will wait to confirm the necessary information when they reach out to the office again.

## 2020-08-08 NOTE — Telephone Encounter (Signed)
I was able to give Angel Gordon the verbal confirmation needed to proceed with the pts work accommodations.

## 2020-08-29 ENCOUNTER — Other Ambulatory Visit: Payer: Self-pay | Admitting: Internal Medicine

## 2020-08-29 DIAGNOSIS — E118 Type 2 diabetes mellitus with unspecified complications: Secondary | ICD-10-CM

## 2020-09-09 ENCOUNTER — Other Ambulatory Visit: Payer: Self-pay | Admitting: Internal Medicine

## 2020-09-09 DIAGNOSIS — E118 Type 2 diabetes mellitus with unspecified complications: Secondary | ICD-10-CM

## 2020-10-06 ENCOUNTER — Other Ambulatory Visit: Payer: Self-pay | Admitting: Internal Medicine

## 2020-10-06 DIAGNOSIS — E118 Type 2 diabetes mellitus with unspecified complications: Secondary | ICD-10-CM

## 2020-10-07 ENCOUNTER — Other Ambulatory Visit: Payer: Self-pay | Admitting: Internal Medicine

## 2020-10-07 DIAGNOSIS — J301 Allergic rhinitis due to pollen: Secondary | ICD-10-CM

## 2020-10-10 ENCOUNTER — Other Ambulatory Visit: Payer: Self-pay | Admitting: Internal Medicine

## 2020-10-10 DIAGNOSIS — E785 Hyperlipidemia, unspecified: Secondary | ICD-10-CM

## 2020-10-16 ENCOUNTER — Other Ambulatory Visit: Payer: Self-pay | Admitting: Internal Medicine

## 2020-10-16 DIAGNOSIS — N1831 Chronic kidney disease, stage 3a: Secondary | ICD-10-CM

## 2020-10-16 DIAGNOSIS — E118 Type 2 diabetes mellitus with unspecified complications: Secondary | ICD-10-CM

## 2021-01-02 ENCOUNTER — Other Ambulatory Visit: Payer: Self-pay | Admitting: Internal Medicine

## 2021-01-02 DIAGNOSIS — E118 Type 2 diabetes mellitus with unspecified complications: Secondary | ICD-10-CM

## 2021-01-02 DIAGNOSIS — K760 Fatty (change of) liver, not elsewhere classified: Secondary | ICD-10-CM

## 2021-01-02 DIAGNOSIS — N1831 Chronic kidney disease, stage 3a: Secondary | ICD-10-CM

## 2021-01-02 MED ORDER — KERENDIA 20 MG PO TABS: 1.0000 | ORAL_TABLET | Freq: Every day | ORAL | 2 refills | Status: DC

## 2021-01-02 NOTE — Telephone Encounter (Signed)
1.Medication Requested: KERENDIA 20 MG TABS  2. Pharmacy (Name, Street, Ong): [id number] - [place], [place]  3. On Med List: yes  4. Last Visit with PCP: 07-26-2020  5. Next visit date with PCP: 01-31-2021  Patient states she is going on a cruise and requesting a patch for motion sickness  Encourage patient to schedule ov,  patient states patch has been prescribed before   Please advise

## 2021-01-03 ENCOUNTER — Encounter: Payer: Self-pay | Admitting: Internal Medicine

## 2021-01-03 ENCOUNTER — Other Ambulatory Visit: Payer: Self-pay | Admitting: Internal Medicine

## 2021-01-03 MED ORDER — SCOPOLAMINE 1 MG/3DAYS TD PT72: 1.0000 | MEDICATED_PATCH | TRANSDERMAL | 1 refills | Status: DC

## 2021-01-04 ENCOUNTER — Other Ambulatory Visit: Payer: Self-pay | Admitting: Internal Medicine

## 2021-01-04 DIAGNOSIS — E118 Type 2 diabetes mellitus with unspecified complications: Secondary | ICD-10-CM

## 2021-01-09 ENCOUNTER — Other Ambulatory Visit: Payer: Self-pay | Admitting: Internal Medicine

## 2021-01-09 DIAGNOSIS — E785 Hyperlipidemia, unspecified: Secondary | ICD-10-CM

## 2021-01-31 ENCOUNTER — Ambulatory Visit: Payer: BC Managed Care – PPO | Admitting: Internal Medicine

## 2021-01-31 ENCOUNTER — Encounter: Payer: Self-pay | Admitting: Internal Medicine

## 2021-01-31 ENCOUNTER — Other Ambulatory Visit: Payer: Self-pay

## 2021-01-31 VITALS — BP 130/80 | HR 67 | Temp 98.1°F | Resp 16 | Ht 59.0 in | Wt 174.0 lb

## 2021-01-31 DIAGNOSIS — I10 Essential (primary) hypertension: Secondary | ICD-10-CM | POA: Diagnosis not present

## 2021-01-31 DIAGNOSIS — E118 Type 2 diabetes mellitus with unspecified complications: Secondary | ICD-10-CM | POA: Diagnosis not present

## 2021-01-31 DIAGNOSIS — D892 Hypergammaglobulinemia, unspecified: Secondary | ICD-10-CM | POA: Diagnosis not present

## 2021-01-31 DIAGNOSIS — N1831 Chronic kidney disease, stage 3a: Secondary | ICD-10-CM | POA: Diagnosis not present

## 2021-01-31 DIAGNOSIS — R0683 Snoring: Secondary | ICD-10-CM

## 2021-01-31 LAB — BASIC METABOLIC PANEL
BUN: 20 mg/dL (ref 6–23)
CO2: 20 mEq/L (ref 19–32)
Calcium: 10 mg/dL (ref 8.4–10.5)
Chloride: 101 mEq/L (ref 96–112)
Creatinine, Ser: 1.04 mg/dL (ref 0.40–1.20)
GFR: 57.35 mL/min — ABNORMAL LOW (ref >60.00)
Glucose, Bld: 68 mg/dL — ABNORMAL LOW (ref 70–99)
Potassium: 4 mEq/L (ref 3.5–5.1)
Sodium: 134 mEq/L — ABNORMAL LOW (ref 135–145)

## 2021-01-31 LAB — HEPATIC FUNCTION PANEL
ALT: 21 U/L (ref 0–35)
AST: 34 U/L (ref 0–37)
Albumin: 4 g/dL (ref 3.5–5.2)
Alkaline Phosphatase: 60 U/L (ref 39–117)
Bilirubin, Direct: 0.2 mg/dL (ref 0.0–0.3)
Total Bilirubin: 0.5 mg/dL (ref 0.2–1.2)
Total Protein: 8.5 g/dL — ABNORMAL HIGH (ref 6.0–8.3)

## 2021-01-31 LAB — TSH: TSH: 2.83 u[IU]/mL (ref 0.35–5.50)

## 2021-01-31 LAB — HEMOGLOBIN A1C: Hgb A1c MFr Bld: 6.7 % — ABNORMAL HIGH (ref 4.6–6.5)

## 2021-01-31 MED ORDER — DAPAGLIFLOZIN PROPANEDIOL 10 MG PO TABS: 10.0000 mg | ORAL_TABLET | Freq: Every day | ORAL | 1 refills | Status: DC

## 2021-01-31 MED ORDER — METFORMIN HCL 500 MG PO TABS: 500.0000 mg | ORAL_TABLET | Freq: Two times a day (BID) | ORAL | 1 refills | Status: DC

## 2021-01-31 MED ORDER — KERENDIA 20 MG PO TABS: 1.0000 | ORAL_TABLET | Freq: Every day | ORAL | 1 refills | Status: DC

## 2021-01-31 NOTE — Patient Instructions (Signed)
Type 2 Diabetes Mellitus, Diagnosis, Adult ?Type 2 diabetes (type 2 diabetes mellitus) is a long-term, or chronic, disease. In type 2 diabetes, one or both of these problems may be present: ?The pancreas does not make enough of a hormone called insulin. ?Cells in the body do not respond properly to the insulin that the body makes (insulin resistance). ?Normally, insulin allows blood sugar (glucose) to enter cells in the body. The cells use glucose for energy. Insulin resistance or lack of insulin causes excess glucose to build up in the blood instead of going into cells. This causes high blood glucose (hyperglycemia).  ?What are the causes? ?The exact cause of type 2 diabetes is not known. ?What increases the risk? ?The following factors may make you more likely to develop this condition: ?Having a family member with type 2 diabetes. ?Being overweight or obese. ?Being inactive (sedentary). ?Having been diagnosed with insulin resistance. ?Having a history of prediabetes, diabetes when you were pregnant (gestational diabetes), or polycystic ovary syndrome (PCOS). ?What are the signs or symptoms? ?In the early stage of this condition, you may not have symptoms. Symptoms develop slowly and may include: ?Increased thirst or hunger. ?Increased urination. ?Unexplained weight loss. ?Tiredness (fatigue) or weakness. ?Vision changes, such as blurry vision. ?Dark patches on the skin. ?How is this diagnosed? ?This condition is diagnosed based on your symptoms, your medical history, a physical exam, and your blood glucose level. Your blood glucose may be checked with one or more of the following blood tests: ?A fasting blood glucose (FBG) test. You will not be allowed to eat (you will fast) for 8 hours or longer before a blood sample is taken. ?A random blood glucose test. This test checks blood glucose at any time of day regardless of when you ate. ?An A1C (hemoglobin A1C) blood test. This test provides information about blood  glucose levels over the previous 2-3 months. ?An oral glucose tolerance test (OGTT). This test measures your blood glucose at two times: ?After fasting. This is your baseline blood glucose level. ?Two hours after drinking a beverage that contains glucose. ?You may be diagnosed with type 2 diabetes if: ?Your fasting blood glucose level is 126 mg/dL (7.0 mmol/L) or higher. ?Your random blood glucose level is 200 mg/dL (11.1 mmol/L) or higher. ?Your A1C level is 6.5% or higher. ?Your oral glucose tolerance test result is higher than 200 mg/dL (11.1 mmol/L). ?These blood tests may be repeated to confirm your diagnosis. ?How is this treated? ?Your treatment may be managed by a specialist called an endocrinologist. Type 2 diabetes may be treated by following instructions from your health care provider about: ?Making dietary and lifestyle changes. These may include: ?Following a personalized nutrition plan that is developed by a registered dietitian. ?Exercising regularly. ?Finding ways to manage stress. ?Checking your blood glucose level as often as told. ?Taking diabetes medicines or insulin daily. This helps to keep your blood glucose levels in the healthy range. ?Taking medicines to help prevent complications from diabetes. Medicines may include: ?Aspirin. ?Medicine to lower cholesterol. ?Medicine to control blood pressure. ?Your health care provider will set treatment goals for you. Your goals will be based on your age, other medical conditions you have, and how you respond to diabetes treatment. Generally, the goal of treatment is to maintain the following blood glucose levels: ?Before meals: 80-130 mg/dL (4.4-7.2 mmol/L). ?After meals: below 180 mg/dL (10 mmol/L). ?A1C level: less than 7%. ?Follow these instructions at home: ?Questions to ask your health care provider ?  Consider asking the following questions: ?Should I meet with a certified diabetes care and education specialist? ?What diabetes medicines do I need,  and when should I take them? ?What equipment will I need to manage my diabetes at home? ?How often do I need to check my blood glucose? ?Where can I find a support group for people with diabetes? ?What number can I call if I have questions? ?When is my next appointment? ?General instructions ?Take over-the-counter and prescription medicines only as told by your health care provider. ?Keep all follow-up visits. This is important. ?Where to find more information ?For help and guidance and for more information about diabetes, please visit: ?American Diabetes Association (ADA): www.diabetes.org ?American Association of Diabetes Care and Education Specialists (ADCES): www.diabeteseducator.org ?International Diabetes Federation (IDF): www.idf.org ?Contact a health care provider if: ?Your blood glucose is at or above 240 mg/dL (13.3 mmol/L) for 2 days in a row. ?You have been sick or have had a fever for 2 days or longer, and you are not getting better. ?You have any of the following problems for more than 6 hours: ?You cannot eat or drink. ?You have nausea and vomiting. ?You have diarrhea. ?Get help right away if: ?You have severe hypoglycemia. This means your blood glucose is lower than 54 mg/dL (3.0 mmol/L). ?You become confused or you have trouble thinking clearly. ?You have difficulty breathing. ?You have moderate or large ketone levels in your urine. ?These symptoms may represent a serious problem that is an emergency. Do not wait to see if the symptoms will go away. Get medical help right away. Call your local emergency services (911 in the [place] Do not drive yourself to the hospital. ?Summary ?Type 2 diabetes mellitus is a long-term, or chronic, disease. In type 2 diabetes, the pancreas does not make enough of a hormone called insulin, or cells in the body do not respond properly to insulin that the body makes. ?This condition is treated by making dietary and lifestyle changes and taking diabetes medicines or  insulin. ?Your health care provider will set treatment goals for you. Your goals will be based on your age, other medical conditions you have, and how you respond to diabetes treatment. ?Keep all follow-up visits. This is important. ?This information is not intended to replace advice given to you by your health care provider. Make sure you discuss any questions you have with your health care provider. ?Document Revised: 04/04/2020 Document Reviewed: 04/04/2020 ?Elsevier Patient Education ? 2022 Elsevier Inc. ? ?

## 2021-01-31 NOTE — Progress Notes (Signed)
Subjective:  Patient ID: Angel Gordon, female    DOB: 11-02-1955  Age: 66 y.o. MRN: [id number]  CC: Hypertension and Diabetes  This visit occurred during the SARS-CoV-2 public health emergency.  Safety protocols were in place, including screening questions prior to the visit, additional usage of staff PPE, and extensive cleaning of exam room while observing appropriate contact time as indicated for disinfecting solutions.    HPI Angel Gordon presents for f/up -  She complains of a 9 month hx of fatigue and weight gain. Her husband complains about her snoring. She is active and denies DOE, CP, SOB, or edema.  Outpatient Medications Prior to Visit  Medication Sig Dispense Refill   atorvastatin (LIPITOR) 10 MG tablet TAKE 1 TABLET DAILY 90 tablet 1   Continuous Blood Gluc Sensor (FREESTYLE LIBRE 14 DAY SENSOR) MISC USE AS DIRECTED 6 each 3   diclofenac (VOLTAREN) 75 MG EC tablet Take 1 tablet (75 mg total) by mouth 2 (two) times daily. 50 tablet 2   diclofenac Sodium (VOLTAREN) 1 % GEL diclofenac 1 % topical gel  APPLY 2 GRAMS TO AFFECTED AREA 4 TIMES A DAY     fluticasone (FLONASE) 50 MCG/ACT nasal spray USE 2 SPRAYS IN EACH NOSTRIL DAILY 48 g 1   pioglitazone (ACTOS) 15 MG tablet TAKE 1 TABLET DAILY 90 tablet 0   scopolamine (TRANSDERM-SCOP, 1.5 MG,) 1 MG/3DAYS Place 1 patch (1.5 mg total) onto the skin every 3 (three) days. 4 patch 1   FARXIGA 10 MG TABS tablet TAKE 1 TABLET DAILY 90 tablet 0   Finerenone (KERENDIA) 20 MG TABS Take 1 tablet by mouth daily. 30 tablet 2   metFORMIN (GLUCOPHAGE) 500 MG tablet TAKE 1 TABLET TWICE A DAY 180 tablet 1   No facility-administered medications prior to visit.    ROS Review of Systems  Constitutional:  Positive for fatigue and unexpected weight change. Negative for chills, diaphoresis and fever.  HENT: Negative.    Eyes: Negative.   Respiratory: Negative.  Negative for apnea, choking, shortness of breath and wheezing.    Cardiovascular:  Negative for chest pain, palpitations and leg swelling.  Gastrointestinal:  Negative for abdominal pain, diarrhea, nausea and vomiting.  Endocrine: Negative.   Genitourinary: Negative.   Musculoskeletal: Negative.  Negative for myalgias.  Skin: Negative.   Neurological: Negative.  Negative for dizziness, weakness, light-headedness and headaches.  Hematological:  Negative for adenopathy. Does not bruise/bleed easily.  Psychiatric/Behavioral: Negative.  Negative for dysphoric mood and sleep disturbance. The patient is not nervous/anxious.    Objective:  BP 130/80 (BP Location: Right Arm, Patient Position: Sitting, Cuff Size: Large)    Pulse 67    Temp 98.1 F (36.7 C) (Oral)    Resp 16    Ht 4\' 11"  (1.499 m)    Wt 174 lb (78.9 kg)    LMP 02/23/2011 Comment: pt states she has had recent return of "periods" after going through menopause in her early 49s, instruct pt to call her Gyn to report this, pt verb understanding    SpO2 99%    BMI 35.14 kg/m   BP Readings from Last 3 Encounters:  01/31/21 130/80  07/26/20 128/82  07/15/20 110/62    Wt Readings from Last 3 Encounters:  01/31/21 174 lb (78.9 kg)  07/26/20 166 lb (75.3 kg)  07/15/20 166 lb 3.2 oz (75.4 kg)    Physical Exam Vitals reviewed.  HENT:     Nose: Nose normal.  Mouth/Throat:     Mouth: Mucous membranes are moist.  Eyes:     General: No scleral icterus.    Conjunctiva/sclera: Conjunctivae normal.  Cardiovascular:     Rate and Rhythm: Normal rate and regular rhythm.     Heart sounds: No murmur heard. Pulmonary:     Effort: Pulmonary effort is normal.     Breath sounds: No stridor. No wheezing, rhonchi or rales.  Abdominal:     General: Abdomen is protuberant. There is no distension.     Palpations: There is no mass.     Tenderness: There is no abdominal tenderness. There is no guarding.     Hernia: No hernia is present.  Musculoskeletal:        General: Normal range of motion.     Cervical  back: Neck supple.     Right lower leg: No edema.     Left lower leg: No edema.  Lymphadenopathy:     Cervical: No cervical adenopathy.  Skin:    General: Skin is warm and dry.  Neurological:     General: No focal deficit present.     Mental Status: She is alert.  Psychiatric:        Mood and Affect: Mood normal.        Behavior: Behavior normal.    Lab Results  Component Value Date   WBC 7.5 07/26/2020   HGB 13.5 07/26/2020   HCT 42.1 07/26/2020   PLT 204.0 07/26/2020   GLUCOSE 68 (L) 01/31/2021   CHOL 106 07/26/2020   TRIG 68.0 07/26/2020   HDL 51.10 07/26/2020   LDLCALC 42 07/26/2020   ALT 21 01/31/2021   AST 34 01/31/2021   NA 134 (L) 01/31/2021   K 4.0 01/31/2021   CL 101 01/31/2021   CREATININE 1.04 01/31/2021   BUN 20 01/31/2021   CO2 20 01/31/2021   TSH 2.83 01/31/2021   HGBA1C 6.7 (H) 01/31/2021   MICROALBUR <0.7 07/26/2020    DG Chest 2 View  Result Date: 07/21/2018 CLINICAL DATA:  Chest wall pain status post fall 3 weeks ago EXAM: CHEST - 2 VIEW COMPARISON:  07/27/2016 FINDINGS: The heart size and mediastinal contours are within normal limits. Both lungs are clear. The visualized skeletal structures are unremarkable. IMPRESSION: No active cardiopulmonary disease. Electronically Signed   By: Kathreen Devoid   On: 07/21/2018 10:17    Assessment & Plan:   Angel Gordon was seen today for hypertension and diabetes.  Diagnoses and all orders for this visit:  Essential hypertension- Her blood pressure is adequately well controlled. -     Basic metabolic panel; Future -     Hepatic function panel; Future -     TSH; Future -     TSH -     Hepatic function panel -     Basic metabolic panel  Type 2 diabetes mellitus with complication, without long-term current use of insulin ([place])- Her blood sugar is adequately well controlled.   -     Basic metabolic panel; Future -     Hemoglobin A1c; Future -     Finerenone (KERENDIA) 20 MG TABS; Take 1 tablet by mouth  daily. -     metFORMIN (GLUCOPHAGE) 500 MG tablet; Take 1 tablet (500 mg total) by mouth 2 (two) times daily. -     dapagliflozin propanediol (FARXIGA) 10 MG TABS tablet; Take 1 tablet (10 mg total) by mouth daily. -     Hemoglobin A1c -     Basic  metabolic panel  Paraproteinemia- The total protein level is lower than it was 6 months ago.  Will continue to monitor. -     Hepatic function panel; Future -     Hepatic function panel  Stage 3a chronic kidney disease ([place])- Her potassium level is normal.  Will continue the MRA. -     Finerenone (KERENDIA) 20 MG TABS; Take 1 tablet by mouth daily.  Type II diabetes mellitus with manifestations ([facility]) -     metFORMIN (GLUCOPHAGE) 500 MG tablet; Take 1 tablet (500 mg total) by mouth 2 (two) times daily. -     dapagliflozin propanediol (FARXIGA) 10 MG TABS tablet; Take 1 tablet (10 mg total) by mouth daily.  Loud snoring -     Ambulatory referral to Sleep Studies   I have changed Laurell Roof Farxiga to dapagliflozin propanediol. I have also changed her metFORMIN. I am also having her maintain her diclofenac, diclofenac Sodium, FreeStyle Libre 14 Day Sensor, fluticasone, pioglitazone, scopolamine, atorvastatin, and [place].  Meds ordered this encounter  Medications   Finerenone (KERENDIA) 20 MG TABS    Sig: Take 1 tablet by mouth daily.    Dispense:  90 tablet    Refill:  1   metFORMIN (GLUCOPHAGE) 500 MG tablet    Sig: Take 1 tablet (500 mg total) by mouth 2 (two) times daily.    Dispense:  180 tablet    Refill:  1   dapagliflozin propanediol (FARXIGA) 10 MG TABS tablet    Sig: Take 1 tablet (10 mg total) by mouth daily.    Dispense:  90 tablet    Refill:  1     Follow-up: Return in about 6 months (around 07/31/2021).  Scarlette Calico, MD

## 2021-02-06 ENCOUNTER — Encounter: Payer: Self-pay | Admitting: Internal Medicine

## 2021-03-08 ENCOUNTER — Other Ambulatory Visit: Payer: Self-pay | Admitting: Internal Medicine

## 2021-03-08 DIAGNOSIS — E118 Type 2 diabetes mellitus with unspecified complications: Secondary | ICD-10-CM

## 2021-03-21 LAB — HM DIABETES EYE EXAM

## 2021-03-27 ENCOUNTER — Other Ambulatory Visit: Payer: Self-pay | Admitting: Internal Medicine

## 2021-03-27 ENCOUNTER — Encounter: Payer: Self-pay | Admitting: Internal Medicine

## 2021-03-29 NOTE — Telephone Encounter (Signed)
I have spoken to the pt and initiated a new PA while on the phone per her request. Second request has been approved. ? ?Key: [id number] ? ?Approved ?02/27/2021 - 03/29/2022 ?

## 2021-03-29 NOTE — Telephone Encounter (Signed)
Patient calling in ? ?Requesting cb from nurse.. says they have been communicating via mychart but he would like to speak w/ her by phone ? ?Please call [phone] ?

## 2021-04-04 ENCOUNTER — Institutional Professional Consult (permissible substitution): Payer: BC Managed Care – PPO | Admitting: Neurology

## 2021-04-05 ENCOUNTER — Encounter: Payer: Self-pay | Admitting: Family Medicine

## 2021-04-05 ENCOUNTER — Other Ambulatory Visit: Payer: Self-pay | Admitting: Internal Medicine

## 2021-04-05 ENCOUNTER — Ambulatory Visit: Payer: BC Managed Care – PPO | Admitting: Family Medicine

## 2021-04-05 VITALS — BP 115/60 | HR 76 | Temp 97.5°F | Ht 59.0 in | Wt 171.8 lb

## 2021-04-05 DIAGNOSIS — E118 Type 2 diabetes mellitus with unspecified complications: Secondary | ICD-10-CM

## 2021-04-05 DIAGNOSIS — K760 Fatty (change of) liver, not elsewhere classified: Secondary | ICD-10-CM

## 2021-04-05 DIAGNOSIS — J029 Acute pharyngitis, unspecified: Secondary | ICD-10-CM

## 2021-04-05 DIAGNOSIS — J301 Allergic rhinitis due to pollen: Secondary | ICD-10-CM

## 2021-04-05 LAB — POCT RAPID STREP A (OFFICE): Rapid Strep A Screen: NEGATIVE

## 2021-04-05 MED ORDER — LIDOCAINE VISCOUS HCL 2 % MT SOLN: 15.0000 mL | OROMUCOSAL | 0 refills | Status: DC | PRN

## 2021-04-05 MED ORDER — FLUTICASONE PROPIONATE 50 MCG/ACT NA SUSP: 2.0000 | Freq: Every day | NASAL | 1 refills | Status: DC

## 2021-04-05 MED ORDER — PIOGLITAZONE HCL 15 MG PO TABS: 15.0000 mg | ORAL_TABLET | Freq: Every day | ORAL | 0 refills | Status: DC

## 2021-04-05 MED ORDER — DAPAGLIFLOZIN PROPANEDIOL 10 MG PO TABS: 10.0000 mg | ORAL_TABLET | Freq: Every day | ORAL | 0 refills | Status: DC

## 2021-04-05 NOTE — Progress Notes (Signed)
Acute Office Visit  Subjective:    Patient ID: Angel Gordon, female    DOB: August 23, 1955, 66 y.o.   MRN: [id number]  CC: sore throat  Sore Throat  This is a new problem. The current episode started in the past 7 days. The problem has been gradually worsening. Neither side of throat is experiencing more pain than the other. There has been no fever. The pain is at a severity of 8/10. The pain is moderate. Associated symptoms include coughing and neck pain. Pertinent negatives include no abdominal pain, congestion, drooling, ear discharge, ear pain, headaches, hoarse voice, plugged ear sensation, shortness of breath, swollen glands, trouble swallowing or vomiting. She has had no exposure to strep or mono. She has tried gargles, NSAIDs and cool liquids for the symptoms. The treatment provided mild relief.  Did go to the [employer] [facility] [employer] Basketball championship over the weekend and was yelling during the game.     Past Medical History:  Diagnosis Date   Allergy    seasonal   Post-menopausal bleeding 03/16/2019   pt reports going through menopause in her early 82s, no periods for many years, then states now she has a "return of her periods" and is not in menopause.  let pt know to call her gyne to report this bleeding, that it is considered bleeding not her period since she is post menopausal, pt verb understanding.   Type II or unspecified type diabetes mellitus without mention of complication, not stated as uncontrolled    metformin 500mg  bid-    Past Surgical History:  Procedure Laterality Date   COLONOSCOPY  2010   DILATION AND CURETTAGE OF UTERUS     FOOT SURGERY  2002   HYSTEROSCOPY WITH D & C  08/17/2011   Procedure: DILATATION AND CURETTAGE /HYSTEROSCOPY;  Surgeon: Zelphia Cairo, MD;  Location: [facility];  Service: Gynecology;  Laterality: N/A;   TUBAL LIGATION      Family History  Problem Relation Age of Onset   Hypertension Mother    Stroke Mother    Kidney disease Mother    Thyroid  disease Mother    Cancer Mother    Heart disease Mother    Hypertension Son    Diabetes Brother    Cancer Maternal Grandmother        colon   Kidney disease Maternal Grandmother    Stroke Maternal Grandmother    Colon cancer Maternal Grandmother    Colon polyps Maternal Grandmother    Liver cancer Brother    Drug abuse Neg Hx    Early death Neg Hx    Hearing loss Neg Hx    Hyperlipidemia Neg Hx    Learning disabilities Neg Hx    Esophageal cancer Neg Hx    Rectal cancer Neg Hx    Stomach cancer Neg Hx     Social History   Socioeconomic History   Marital status: Married    Spouse name: Not on file   Number of children: 1   Years of education: 12+   Highest education level: Not on file  Occupational History    Employer: [employer]  Tobacco Use   Smoking status: Never   Smokeless tobacco: Never  Vaping Use   Vaping Use: Never used  Substance and Sexual Activity   Alcohol use: Never   Drug use: Never   Sexual activity: Yes    Birth control/protection: Post-menopausal  Other Topics Concern   Not on file  Social History Narrative   HSG, [employer] -  classes, married '78 - 3 years, divorced; married '96. 1 son ' '79; 3 grandchildren. work: [place]   Social Determinants of Corporate investment banker Strain: Not on [employer] Insecurity: Not on file  Transportation Needs: Not on file  Physical Activity: Not on file  Stress: Not on file  Social Connections: Not on file  Intimate Partner Violence: Not on file    Outpatient Medications Prior to Visit  Medication Sig Dispense Refill   atorvastatin (LIPITOR) 10 MG tablet TAKE 1 TABLET DAILY 90 tablet 1   Continuous Blood Gluc Sensor (FREESTYLE LIBRE 14 DAY SENSOR) MISC USE AS DIRECTED 6 each 3   dapagliflozin propanediol (FARXIGA) 10 MG TABS tablet Take 1 tablet (10 mg total) by mouth daily. 90 tablet 1   KERENDIA 20 MG TABS Take 1 tablet by mouth daily.     metFORMIN (GLUCOPHAGE) 500 MG tablet TAKE 1 TABLET TWICE A DAY  180 tablet 1   pioglitazone (ACTOS) 15 MG tablet TAKE 1 TABLET DAILY 90 tablet 0   diclofenac (VOLTAREN) 75 MG EC tablet Take 1 tablet (75 mg total) by mouth 2 (two) times daily. 50 tablet 2   diclofenac Sodium (VOLTAREN) 1 % GEL diclofenac 1 % topical gel  APPLY 2 GRAMS TO AFFECTED AREA 4 TIMES A DAY     fluticasone (FLONASE) 50 MCG/ACT nasal spray USE 2 SPRAYS IN EACH NOSTRIL DAILY 48 g 1   scopolamine (TRANSDERM-SCOP, 1.5 MG,) 1 MG/3DAYS Place 1 patch (1.5 mg total) onto the skin every 3 (three) days. 4 patch 1   No facility-administered medications prior to visit.    No Known Allergies  Review of Systems: All review of systems negative except what is listed in the HPI      Objective:    Physical Exam Vitals reviewed.  Constitutional:      Appearance: Normal appearance.  HENT:     Head: Normocephalic and atraumatic.     Nose: Nose normal.     Mouth/Throat:     Mouth: Mucous membranes are moist.     Pharynx: Oropharynx is clear. No oropharyngeal exudate or posterior oropharyngeal erythema.  Eyes:     Conjunctiva/sclera: Conjunctivae normal.  Cardiovascular:     Rate and Rhythm: Normal rate and regular rhythm.  Pulmonary:     Effort: Pulmonary effort is normal.     Breath sounds: Normal breath sounds.  Musculoskeletal:     Cervical back: Normal range of motion and neck supple. No tenderness.  Lymphadenopathy:     Cervical: No cervical adenopathy.  Neurological:     General: No focal deficit present.     Mental Status: She is alert and oriented to person, place, and time. Mental status is at baseline.  Psychiatric:        Mood and Affect: Mood normal.        Behavior: Behavior normal.        Thought Content: Thought content normal.        Judgment: Judgment normal.    BP 115/60   Pulse 76   Temp (!) 97.5 F (36.4 C)   Ht 4\' 11"  (1.499 m)   Wt 171 lb 12.8 oz (77.9 kg)   LMP 02/23/2011 Comment: pt states she has had recent return of "periods" after going through  menopause in her early 36s, instruct pt to call her Gyn to report this, pt verb understanding   BMI 34.70 kg/m  Wt Readings from Last 3 Encounters:  04/05/21 171 lb  12.8 oz (77.9 kg)  01/31/21 174 lb (78.9 kg)  07/26/20 166 lb (75.3 kg)    Health Maintenance Due  Topic Date Due   Zoster Vaccines- Shingrix (1 of 2) Never done   INFLUENZA VACCINE  Never done   TETANUS/TDAP  09/18/2020   OPHTHALMOLOGY EXAM  02/11/2021    There are no preventive care reminders to display for this patient.   Lab Results  Component Value Date   TSH 2.83 01/31/2021   Lab Results  Component Value Date   WBC 7.5 07/26/2020   HGB 13.5 07/26/2020   HCT 42.1 07/26/2020   MCV 87.5 07/26/2020   PLT 204.0 07/26/2020   Lab Results  Component Value Date   NA 134 (L) 01/31/2021   K 4.0 01/31/2021   CO2 20 01/31/2021   GLUCOSE 68 (L) 01/31/2021   BUN 20 01/31/2021   CREATININE 1.04 01/31/2021   BILITOT 0.5 01/31/2021   ALKPHOS 60 01/31/2021   AST 34 01/31/2021   ALT 21 01/31/2021   PROT 8.5 (H) 01/31/2021   ALBUMIN 4.0 01/31/2021   CALCIUM 10.0 01/31/2021   GFR 57.35 (L) 01/31/2021   Lab Results  Component Value Date   CHOL 106 07/26/2020   Lab Results  Component Value Date   HDL 51.10 07/26/2020   Lab Results  Component Value Date   LDLCALC 42 07/26/2020   Lab Results  Component Value Date   TRIG 68.0 07/26/2020   Lab Results  Component Value Date   CHOLHDL 2 07/26/2020   Lab Results  Component Value Date   HGBA1C 6.7 (H) 01/31/2021       Assessment & Plan:    1. Sore throat Likely viral, could be allergic or related to yelling at the basketball game. Strep test negative. Recommended voice rest, soothing liquids, gargles, throat lozenges, throat spray, Vitamin C, zinc. Consider taking your Claritin and nasal spray regularly for the next few days/week. Lidocaine rinse for severe pain. Education handout provided. Patient aware of signs/symptoms requiring further/urgent  evaluation.   - POCT rapid strep A - lidocaine (XYLOCAINE) 2 % solution; Use as directed 15 mLs in the mouth or throat every 4 (four) hours as needed for mouth pain.  Dispense: 100 mL; Refill: 0  Please contact office for follow-up if symptoms do not improve or worsen. Seek emergency care if symptoms become severe.   Clayborne Dana, NP

## 2021-04-05 NOTE — Progress Notes (Signed)
Sore throat started Monday  ?Not been around anyone sick ?No other symptoms ?No fever ?

## 2021-04-21 ENCOUNTER — Ambulatory Visit: Payer: BC Managed Care – PPO | Admitting: Neurology

## 2021-04-21 ENCOUNTER — Encounter: Payer: Self-pay | Admitting: Neurology

## 2021-04-21 VITALS — BP 97/64 | HR 77 | Ht 59.0 in | Wt 173.0 lb

## 2021-04-21 DIAGNOSIS — G4719 Other hypersomnia: Secondary | ICD-10-CM

## 2021-04-21 DIAGNOSIS — E66811 Obesity, class 1: Secondary | ICD-10-CM

## 2021-04-21 DIAGNOSIS — E669 Obesity, unspecified: Secondary | ICD-10-CM

## 2021-04-21 DIAGNOSIS — R0683 Snoring: Secondary | ICD-10-CM

## 2021-04-21 DIAGNOSIS — R351 Nocturia: Secondary | ICD-10-CM | POA: Diagnosis not present

## 2021-04-21 NOTE — Progress Notes (Signed)
Subjective:  ?  ?Patient ID: Angel Gordon is a 66 y.o. female. ? ?HPI ? ? ? ?Angel Age, MD, PhD ?[facility] ?[address] ?[phone] ?[place], [place] [address] ? ?Dear Dr. Ronnald Ramp,  ? ?I saw your patient, Angel Gordon, upon your kind request in my sleep clinic today for initial consultation of her sleep disorder, in particular, concern for underlying obstructive sleep apnea.  The patient is unaccompanied today.  As you know, Angel Gordon is a 66 year old right-handed woman with an underlying medical history of diabetes, hypertension, chronic kidney disease, allergies and obesity, who reports snoring and excessive daytime somnolence. I reviewed your office note from 01/31/2021.  Her Epworth sleepiness score is 4/24, fatigue severity score is 32/63. She lives with her husband, who does not sleep in the same room with her. Her husband's GC live with them, ages 45 and 52. She has one son and one grandson.  ?She has no FHx of OSA. She has been told by a friend recently, that her snoring is loud. She has stayed with her friend recently to support her friend at the time of her grief. Patient usually has a bedtime of about 7-9 PM but lately it is around midnight. Her rise time is 5-6 AM. She has nocturia of 1-2 per night. She has no recurrent AM HAs. She does not drink caffeine daily, non-smoker and does not drink alcohol.  ? ?Her Past Medical History Is Significant For: ?Past Medical History:  ?Diagnosis Date  ? Allergy   ? seasonal  ? Post-menopausal bleeding 03/16/2019  ? pt reports going through menopause in her early 9s, no periods for many years, then states now she has a "return of her periods" and is not in menopause.  let pt know to call her gyne to report this bleeding, that it is considered bleeding not her period since she is post menopausal, pt verb understanding.  ? Type II or unspecified type diabetes mellitus without mention of complication, not stated as uncontrolled    ? metformin '500mg'$  bid-  ? ? ?Her Past Surgical History Is Significant For: ?Past Surgical History:  ?Procedure Laterality Date  ? COLONOSCOPY  2010  ? DILATION AND CURETTAGE OF UTERUS    ? FOOT SURGERY  2002  ? HYSTEROSCOPY WITH D & C  08/17/2011  ? Procedure: DILATATION AND CURETTAGE /HYSTEROSCOPY;  Surgeon: Marylynn Pearson, MD;  Location: [facility];  Service: Gynecology;  Laterality: N/A;  ? TUBAL LIGATION    ? ? ?Her Family History Is Significant For: ?Family History  ?Problem Relation Gordon of Onset  ? Hypertension Mother   ? Stroke Mother   ? Kidney disease Mother   ? Thyroid disease Mother   ? Cancer Mother   ? Heart disease Mother   ? Hypertension Son   ? Diabetes Brother   ? Cancer Maternal Grandmother   ?     colon  ? Kidney disease Maternal Grandmother   ? Stroke Maternal Grandmother   ? Colon cancer Maternal Grandmother   ? Colon polyps Maternal Grandmother   ? Liver cancer Brother   ? Drug abuse Neg Hx   ? Early death Neg Hx   ? Hearing loss Neg Hx   ? Hyperlipidemia Neg Hx   ? Learning disabilities Neg Hx   ? Esophageal cancer Neg Hx   ? Rectal cancer Neg Hx   ? Stomach cancer Neg Hx   ? ? ?Her Social History Is Significant For: ?Social  History  ? ?Socioeconomic History  ? Marital status: Married  ?  Spouse name: Not on file  ? Number of children: 1  ? Years of education: 12+  ? Highest education level: Not on file  ?Occupational History  ?  Employer: [employer]  ?Tobacco Use  ? Smoking status: Never  ? Smokeless tobacco: Never  ?Vaping Use  ? Vaping Use: Never used  ?Substance and Sexual Activity  ? Alcohol use: Never  ? Drug use: Never  ? Sexual activity: Yes  ?  Birth control/protection: Post-menopausal  ?Other Topics Concern  ? Not on file  ?Social History Narrative  ? HSG, [employer] - classes, married '78 - 3 years, divorced; married '96. 1 son ' '79; 3 grandchildren. work: [place]  ? ?Social Determinants of Health  ? ?Financial Resource Strain: Not on file  ?Food Insecurity: Not on file  ?Transportation  Needs: Not on file  ?Physical Activity: Not on file  ?Stress: Not on file  ?Social Connections: Not on file  ? ? ?Her Allergies Are:  ?No Known Allergies:  ? ?Her Current Medications Are:  ?Outpatient Encounter Medications as of 64/31/2023  ?Medication Sig  ? atorvastatin (LIPITOR) 10 MG tablet TAKE 1 TABLET DAILY  ? Continuous Blood Gluc Sensor (FREESTYLE LIBRE 14 DAY SENSOR) MISC USE AS DIRECTED  ? dapagliflozin propanediol (FARXIGA) 10 MG TABS tablet Take 1 tablet (10 mg total) by mouth daily.  ? fluticasone (FLONASE) 50 MCG/ACT nasal spray Place 2 sprays into both nostrils daily.  ? KERENDIA 20 MG TABS Take 1 tablet by mouth daily.  ? lidocaine (XYLOCAINE) 2 % solution Use as directed 15 mLs in the mouth or throat every 4 (four) hours as needed for mouth pain.  ? metFORMIN (GLUCOPHAGE) 500 MG tablet TAKE 1 TABLET TWICE A DAY  ? pioglitazone (ACTOS) 15 MG tablet Take 1 tablet (15 mg total) by mouth daily.  ? ?No facility-administered encounter medications on file as of 64/31/2023.  ?: ? ? ?Review of Systems:  ?Out of a complete 14 point review of systems, all are reviewed and negative with the exception of these symptoms as listed below: ? ?Review of Systems  ?Neurological:   ?     Pt presents today to evaluate for OSA. Her PCP asked if she snores and she said yes. On avg she sleeps 8-10 hr of sleep apnea night waking up maybe 1-2 times to void. Overall states that she sleeps well and wakes refreshed. She has never had a SS.  ?Epworth Sleepiness Scale ?0= would never doze ?1= slight chance of dozing ?2= moderate chance of dozing ?3= high chance of dozing ? ?Sitting and reading:0 ?Watching TV:1 ?Sitting inactive in a public place (ex. Theater or meeting):1 ?As a passenger in a car for an hour without a break:1 ?Lying down to rest in the afternoon:1 ?Sitting and talking to someone:0 ?Sitting quietly after lunch (no alcohol):0 ?In a car, while stopped in traffic:0 ?Total:4 ?  ? ?Objective:  ?Neurological  Exam ? ?Physical Exam ?Physical Examination:  ? ?Vitals:  ? 04/21/21 1037  ?BP: 97/64  ?Pulse: 77  ? ?General Examination: The patient is a very pleasant 65 y.o. female in no acute distress. She appears well-developed and well-nourished and well groomed.  ? ?HEENT: Normocephalic, atraumatic, pupils are equal, round and reactive to light, extraocular tracking is good without limitation to gaze excursion or nystagmus noted. Hearing is grossly intact. Face is symmetric with normal facial animation. Speech is clear with no dysarthria noted.  There is no hypophonia. There is no lip, neck/head, jaw or voice tremor. Neck is supple with full range of passive and active motion. There are no carotid bruits on auscultation. Oropharynx exam reveals: mild mouth dryness, adequate dental hygiene and moderate airway crowding, due to smaller airway. Mallampati is class III. Tongue protrudes centrally and palate elevates symmetrically. Tonsils are not fully visualized. Neck size is 12 5/8 inches. She has a Mild overbite.  ? ?Chest: no labored breathing.  ? ?Abdomen: Soft, non-tender and non-distended. ? ?Extremities: There is trace edema R ankle.  ? ?Skin: Warm and dry without trophic changes noted.  ? ?Musculoskeletal: exam reveals no obvious joint deformities.  ? ?Neurologically:  ?Mental status: The patient is awake, alert and oriented in all 4 spheres. Her immediate and remote memory, attention, language skills and fund of knowledge are appropriate. There is no evidence of aphasia, agnosia, apraxia or anomia. Speech is clear with normal prosody and enunciation. Thought process is linear. Mood is normal and affect is normal.  ?Cranial nerves II - XII are as described above under HEENT exam.  ?Motor exam: Normal bulk, strength and tone is noted. There is no obvious tremor. Fine motor skills and coordination: grossly intact.  ?Cerebellar testing: No dysmetria or intention tremor. There is no truncal or gait ataxia.  ?Sensory exam:  intact to light touch in the upper and lower extremities.  ?Gait, station and balance: She stands easily. No veering to one side is noted. No leaning to one side is noted. Posture is Gordon-appropriate and stance is narrow

## 2021-04-21 NOTE — Patient Instructions (Addendum)

## 2021-05-15 ENCOUNTER — Ambulatory Visit (INDEPENDENT_AMBULATORY_CARE_PROVIDER_SITE_OTHER): Payer: BC Managed Care – PPO | Admitting: Neurology

## 2021-05-15 DIAGNOSIS — G4733 Obstructive sleep apnea (adult) (pediatric): Secondary | ICD-10-CM | POA: Diagnosis not present

## 2021-05-15 DIAGNOSIS — G4719 Other hypersomnia: Secondary | ICD-10-CM

## 2021-05-15 DIAGNOSIS — E669 Obesity, unspecified: Secondary | ICD-10-CM

## 2021-05-15 DIAGNOSIS — R0683 Snoring: Secondary | ICD-10-CM

## 2021-05-15 DIAGNOSIS — R351 Nocturia: Secondary | ICD-10-CM

## 2021-05-18 NOTE — Procedures (Signed)
? ?  [facility] ? ?HOME SLEEP TEST (Watch PAT) REPORT ? ?STUDY DATE: 05/15/2021 ? ?DOB: May 28, 1955 ? ?MRN: [id number] ? ?ORDERING CLINICIAN: Star Age, MD, PhD ?  ?REFERRING CLINICIAN: Janith Lima, MD  ? ?CLINICAL INFORMATION/HISTORY: 66 year old right-handed woman with an underlying medical history of diabetes, hypertension, chronic kidney disease, allergies and obesity, who reports snoring and excessive daytime somnolence.  ? ?Epworth sleepiness score: 4/24. ? ?BMI: 35.1 kg/m? ? ?FINDINGS:  ? ?Sleep Summary:  ? ?Total Recording Time (hours, min): 10 hours, 3 minutes ? ?Total Sleep Time (hours, min):  8 hours, 36 minutes  ? ?Percent REM (%):    26.1%  ? ?Respiratory Indices:  ? ?Calculated pAHI (per hour):  12.2/hour        ? ?REM pAHI:    29.2/hour      ? ?NREM pAHI: 6.1/hour ? ?Oxygen Saturation Statistics:  ?  ?Oxygen Saturation (%) Mean: 94%  ? ?Minimum oxygen saturation (%):                 85%  ? ?O2 Saturation Range (%): 85-99%   ? ?O2 Saturation (minutes) <=88%: 0.3 min ? ?Pulse Rate Statistics:  ? ?Pulse Mean (bpm):    84/min   ? ?Pulse Range (59-106/min)  ? ?IMPRESSION: OSA (obstructive sleep apnea)  ? ?RECOMMENDATION:  ?This home sleep test demonstrates overall mild obstructive sleep apnea with a total AHI of 12.2/hour and O2 nadir of 85%.  Intermittent mild to moderate snoring was detected.  Given the patient's medical history and sleep related complaints, treatment with positive airway pressure is recommended. This can be achieved in the form of autoPAP trial/titration at home. A  full night CPAP titration study will help with proper treatment settings and mask fitting if needed. Alternative treatments include weight loss along with avoidance of the supine sleep position, or an oral appliance in appropriate candidates.   ?Please note that untreated obstructive sleep apnea may carry additional perioperative morbidity. Patients with significant obstructive sleep apnea should  receive perioperative PAP therapy and the surgeons and particularly the anesthesiologist should be informed of the diagnosis and the severity of the sleep disordered breathing. ?The patient should be cautioned not to drive, work at heights, or operate dangerous or heavy equipment when tired or sleepy. Review and reiteration of good sleep hygiene measures should be pursued with any patient. ?Other causes of the patient's symptoms, including circadian rhythm disturbances, an underlying mood disorder, medication effect and/or an underlying medical problem cannot be ruled out based on this test. Clinical correlation is recommended.  ? ?The patient and her referring provider will be notified of the test results. The patient will be seen in follow up in sleep clinic at [facility]. ? ?I certify that I have reviewed the raw data recording prior to the issuance of this report in accordance with the standards of the American Academy of Sleep Medicine (AASM). ? ? ?INTERPRETING PHYSICIAN:  ? ?Star Age, MD, PhD  ?Board Certified in Neurology and Sleep Medicine ? ?[facility] ?[address], [address] ?[place], [place] [address] ?([phone] ? ? ? ? ? ? ? ? ? ? ? ? ? ? ? ? ? ?

## 2021-05-18 NOTE — Progress Notes (Signed)
See procedure note.

## 2021-05-18 NOTE — Addendum Note (Signed)
Addended by: Star Age on: 05/18/2021 06:21 PM ? ? Modules accepted: Orders ? ?

## 2021-05-22 ENCOUNTER — Telehealth: Payer: Self-pay

## 2021-05-22 NOTE — Telephone Encounter (Signed)
I called pt. I advised pt that Dr. Rexene Alberts reviewed their sleep study results and found that pt has overall mild osa. Dr. Rexene Alberts recommends that pt start an auto-pap at home. I reviewed PAP compliance expectations with the pt. Pt is agreeable to starting an auto-PAP. I advised pt that an order will be sent to a DME, [facility], and [facility] will call the pt within about one week after they file with the pt's insurance. [facility] will show the pt how to use the machine, fit for masks, and troubleshoot the auto-PAP if needed. A follow up appt was made for insurance purposes with Dr. Rexene Alberts on 08/15/21 at 2:15pm with Dr. Rexene Alberts. Pt verbalized understanding to arrive 15 minutes early and bring their auto-PAP. A letter with all of this information in it will be mailed to the pt as a reminder. I verified with the pt that the address we have on file is correct. Pt verbalized understanding of results. Pt had no questions at this time but was encouraged to call back if questions arise. I have sent the order to [facility] and have received confirmation that they have received the order. ? ?

## 2021-05-22 NOTE — Telephone Encounter (Signed)
-----   Message from Star Age, MD sent at 05/18/2021  6:20 PM EDT ----- ?Patient referred by Dr. Ronnald Ramp, seen by me on 04/21/21, HST 05/15/2021.   ? ?Please call and notify the patient that the recent home sleep test showed obstructive sleep apnea. OSA is overall mild, but worth treating to see if she feels better after treatment. To that end I recommend treatment for this in the form of autoPAP, which means, that we don't have to bring her in for a sleep study with CPAP, but will let her try an autoPAP machine at home, through a DME company (of her choice, or as per insurance requirement). The DME representative will educate her on how to use the machine, how to put the mask on, etc. I have placed an order in the chart. Please send referral, talk to patient, send report to referring MD. We will need a FU in sleep clinic for 10 weeks post-PAP set up, please arrange that with me or one of our NPs. Thanks,  ? ?Star Age, MD, PhD ?[facility]) ? ? ? ? ?

## 2021-06-16 ENCOUNTER — Other Ambulatory Visit: Payer: Self-pay | Admitting: Internal Medicine

## 2021-06-16 DIAGNOSIS — K760 Fatty (change of) liver, not elsewhere classified: Secondary | ICD-10-CM

## 2021-06-16 DIAGNOSIS — E118 Type 2 diabetes mellitus with unspecified complications: Secondary | ICD-10-CM

## 2021-06-20 ENCOUNTER — Encounter: Payer: Self-pay | Admitting: Internal Medicine

## 2021-06-21 ENCOUNTER — Ambulatory Visit (HOSPITAL_BASED_OUTPATIENT_CLINIC_OR_DEPARTMENT_OTHER): Payer: BC Managed Care – PPO | Admitting: Orthopaedic Surgery

## 2021-07-03 ENCOUNTER — Other Ambulatory Visit: Payer: Self-pay | Admitting: Internal Medicine

## 2021-07-03 DIAGNOSIS — E118 Type 2 diabetes mellitus with unspecified complications: Secondary | ICD-10-CM

## 2021-07-07 ENCOUNTER — Other Ambulatory Visit: Payer: Self-pay | Admitting: Internal Medicine

## 2021-07-07 DIAGNOSIS — E785 Hyperlipidemia, unspecified: Secondary | ICD-10-CM

## 2021-07-31 ENCOUNTER — Ambulatory Visit
Admission: RE | Admit: 2021-07-31 | Discharge: 2021-07-31 | Disposition: A | Discharge status: Home / Self Care | Payer: BC Managed Care – PPO | Source: Ambulatory Visit | Attending: Internal Medicine | Admitting: Internal Medicine

## 2021-07-31 ENCOUNTER — Encounter: Payer: Self-pay | Admitting: Internal Medicine

## 2021-07-31 ENCOUNTER — Ambulatory Visit: Payer: BC Managed Care – PPO | Admitting: Internal Medicine

## 2021-07-31 VITALS — BP 134/82 | HR 78 | Temp 97.7°F | Resp 16 | Ht 59.0 in | Wt 173.0 lb

## 2021-07-31 DIAGNOSIS — D892 Hypergammaglobulinemia, unspecified: Secondary | ICD-10-CM

## 2021-07-31 DIAGNOSIS — Z0000 Encounter for general adult medical examination without abnormal findings: Secondary | ICD-10-CM

## 2021-07-31 DIAGNOSIS — Z23 Encounter for immunization: Secondary | ICD-10-CM | POA: Diagnosis not present

## 2021-07-31 DIAGNOSIS — I10 Essential (primary) hypertension: Secondary | ICD-10-CM | POA: Diagnosis not present

## 2021-07-31 DIAGNOSIS — E118 Type 2 diabetes mellitus with unspecified complications: Secondary | ICD-10-CM

## 2021-07-31 DIAGNOSIS — E785 Hyperlipidemia, unspecified: Secondary | ICD-10-CM

## 2021-07-31 DIAGNOSIS — R1011 Right upper quadrant pain: Secondary | ICD-10-CM | POA: Insufficient documentation

## 2021-07-31 DIAGNOSIS — N1831 Chronic kidney disease, stage 3a: Secondary | ICD-10-CM | POA: Diagnosis not present

## 2021-07-31 LAB — URINALYSIS, ROUTINE W REFLEX MICROSCOPIC
Bilirubin Urine: NEGATIVE
Hgb urine dipstick: NEGATIVE
Ketones, ur: NEGATIVE
Leukocytes,Ua: NEGATIVE
Nitrite: NEGATIVE
RBC / HPF: NONE SEEN (ref 0–?)
Specific Gravity, Urine: 1.015 (ref 1.000–1.030)
Total Protein, Urine: NEGATIVE
Urine Glucose: >=1000 — AB
Urobilinogen, UA: 0.2 (ref 0.0–1.0)
pH: 5.5 (ref 5.0–8.0)

## 2021-07-31 LAB — CBC WITH DIFFERENTIAL/PLATELET
Basophils Absolute: 0 10*3/uL (ref 0.0–0.1)
Basophils Relative: 0.7 % (ref 0.0–3.0)
Eosinophils Absolute: 0.3 10*3/uL (ref 0.0–0.7)
Eosinophils Relative: 5.1 % — ABNORMAL HIGH (ref 0.0–5.0)
HCT: 39 % (ref 36.0–46.0)
Hemoglobin: 12.4 g/dL (ref 12.0–15.0)
Lymphocytes Relative: 45.8 % (ref 12.0–46.0)
Lymphs Abs: 2.4 10*3/uL (ref 0.7–4.0)
MCHC: 31.9 g/dL (ref 30.0–36.0)
MCV: 88.5 fl (ref 78.0–100.0)
Monocytes Absolute: 0.5 10*3/uL (ref 0.1–1.0)
Monocytes Relative: 9 % (ref 3.0–12.0)
Neutro Abs: 2.1 10*3/uL (ref 1.4–7.7)
Neutrophils Relative %: 39.4 % — ABNORMAL LOW (ref 43.0–77.0)
Platelets: 203 10*3/uL (ref 150.0–400.0)
RBC: 4.41 Mil/uL (ref 3.87–5.11)
RDW: 14.9 % (ref 11.5–15.5)
WBC: 5.3 10*3/uL (ref 4.0–10.5)

## 2021-07-31 LAB — HEMOGLOBIN A1C: Hgb A1c MFr Bld: 6.6 % — ABNORMAL HIGH (ref 4.6–6.5)

## 2021-07-31 LAB — HEPATIC FUNCTION PANEL
ALT: 16 U/L (ref 0–35)
AST: 26 U/L (ref 0–37)
Albumin: 4.1 g/dL (ref 3.5–5.2)
Alkaline Phosphatase: 69 U/L (ref 39–117)
Bilirubin, Direct: 0.2 mg/dL (ref 0.0–0.3)
Total Bilirubin: 0.5 mg/dL (ref 0.2–1.2)
Total Protein: 8.5 g/dL — ABNORMAL HIGH (ref 6.0–8.3)

## 2021-07-31 LAB — BASIC METABOLIC PANEL
BUN: 18 mg/dL (ref 6–23)
CO2: 28 mEq/L (ref 19–32)
Calcium: 10.4 mg/dL (ref 8.4–10.5)
Chloride: 102 mEq/L (ref 96–112)
Creatinine, Ser: 1.03 mg/dL (ref 0.40–1.20)
GFR: 57.81 mL/min — ABNORMAL LOW (ref >60.00)
Glucose, Bld: 86 mg/dL (ref 70–99)
Potassium: 4.2 mEq/L (ref 3.5–5.1)
Sodium: 137 mEq/L (ref 135–145)

## 2021-07-31 LAB — LIPID PANEL
Cholesterol: 102 mg/dL (ref 0–200)
HDL: 55.4 mg/dL (ref >39.00)
LDL Cholesterol: 39 mg/dL (ref 0–99)
NonHDL: 46.13
Total CHOL/HDL Ratio: 2
Triglycerides: 37 mg/dL (ref 0.0–149.0)
VLDL: 7.4 mg/dL (ref 0.0–40.0)

## 2021-07-31 LAB — MICROALBUMIN / CREATININE URINE RATIO
Creatinine,U: 64.9 mg/dL
Microalb Creat Ratio: 1.1 mg/g (ref 0.0–30.0)
Microalb, Ur: <0.7 mg/dL (ref 0.0–1.9)

## 2021-07-31 MED ORDER — FREESTYLE LIBRE 14 DAY SENSOR MISC: 3 refills | Status: DC

## 2021-07-31 NOTE — Patient Instructions (Signed)

## 2021-07-31 NOTE — Progress Notes (Signed)
Subjective:  Patient ID: Angel Gordon, female    DOB: 1955-06-20  Age: 66 y.o. MRN: [id number]  CC: Annual Exam, Hypertension, Hyperlipidemia, Diabetes, and Abdominal Pain   HPI Angel Gordon presents for a CPX and f/up -   She recently fell and has a right foot fracture that is being managed by orthopedics.  She complains of a 10-day history of right upper quadrant abdominal pain.  She describes it as a dull ache with no nausea, vomiting, loss of appetite, weight loss, dysuria, or hematuria.  Outpatient Medications Prior to Visit  Medication Sig Dispense Refill   atorvastatin (LIPITOR) 10 MG tablet TAKE 1 TABLET DAILY 90 tablet 1   FARXIGA 10 MG TABS tablet TAKE 1 TABLET DAILY 90 tablet 0   fluticasone (FLONASE) 50 MCG/ACT nasal spray Place 2 sprays into both nostrils daily. 48 g 1   KERENDIA 20 MG TABS Take 1 tablet by mouth daily.     lidocaine (XYLOCAINE) 2 % solution Use as directed 15 mLs in the mouth or throat every 4 (four) hours as needed for mouth pain. 100 mL 0   metFORMIN (GLUCOPHAGE) 500 MG tablet TAKE 1 TABLET TWICE A DAY 180 tablet 1   pioglitazone (ACTOS) 15 MG tablet TAKE 1 TABLET DAILY 90 tablet 0   Continuous Blood Gluc Sensor (FREESTYLE LIBRE 14 DAY SENSOR) MISC USE AS DIRECTED 6 each 3   No facility-administered medications prior to visit.    ROS Review of Systems  Constitutional:  Negative for chills, diaphoresis, fatigue and fever.  HENT: Negative.    Eyes: Negative.   Respiratory:  Negative for cough, chest tightness, shortness of breath and wheezing.   Cardiovascular:  Negative for chest pain, palpitations and leg swelling.  Gastrointestinal:  Positive for abdominal pain. Negative for abdominal distention, constipation, diarrhea, nausea and vomiting.  Endocrine: Negative.   Genitourinary: Negative.  Negative for decreased urine volume, difficulty urinating, dysuria, flank pain and hematuria.  Musculoskeletal:  Positive for arthralgias. Negative  for myalgias.  Skin: Negative.   Neurological: Negative.  Negative for dizziness, weakness, light-headedness and headaches.  Hematological:  Negative for adenopathy. Does not bruise/bleed easily.  Psychiatric/Behavioral: Negative.      Objective:  BP 134/82 (BP Location: Right Arm, Patient Position: Sitting, Cuff Size: Large)   Pulse 78   Temp 97.7 F (36.5 C) (Oral)   Ht '4\' 11"'$  (1.499 m)   LMP 02/23/2011 Comment: pt states she has had recent return of "periods" after going through menopause in her early 55s, instruct pt to call her Gyn to report this, pt verb understanding   SpO2 98%   BMI 34.94 kg/m   BP Readings from Last 3 Encounters:  07/31/21 134/82  04/21/21 97/64  04/05/21 115/60    Wt Readings from Last 3 Encounters:  04/21/21 173 lb (78.5 kg)  04/05/21 171 lb 12.8 oz (77.9 kg)  01/31/21 174 lb (78.9 kg)    Physical Exam Vitals reviewed.  Constitutional:      Appearance: She is not ill-appearing.  HENT:     Nose: Nose normal.     Mouth/Throat:     Mouth: Mucous membranes are moist.  Eyes:     General: No scleral icterus.    Conjunctiva/sclera: Conjunctivae normal.  Cardiovascular:     Rate and Rhythm: Normal rate and regular rhythm.     Heart sounds: No murmur heard. Pulmonary:     Effort: Pulmonary effort is normal.     Breath sounds: No stridor.  No wheezing, rhonchi or rales.  Abdominal:     General: Abdomen is flat. Bowel sounds are normal. There is no distension.     Palpations: There is no fluid wave, hepatomegaly, splenomegaly or mass.     Tenderness: There is no abdominal tenderness. There is no guarding.     Hernia: No hernia is present.  Musculoskeletal:        General: Normal range of motion.     Cervical back: Neck supple.     Right lower leg: No edema.     Left lower leg: No edema.  Lymphadenopathy:     Cervical: No cervical adenopathy.  Skin:    General: Skin is warm and dry.  Neurological:     General: No focal deficit present.      Mental Status: She is alert.  Psychiatric:        Mood and Affect: Mood normal.        Behavior: Behavior normal.     Lab Results  Component Value Date   WBC 5.3 07/31/2021   HGB 12.4 07/31/2021   HCT 39.0 07/31/2021   PLT 203.0 07/31/2021   GLUCOSE 86 07/31/2021   CHOL 102 07/31/2021   TRIG 37.0 07/31/2021   HDL 55.40 07/31/2021   LDLCALC 39 07/31/2021   ALT 16 07/31/2021   AST 26 07/31/2021   NA 137 07/31/2021   K 4.2 07/31/2021   CL 102 07/31/2021   CREATININE 1.03 07/31/2021   BUN 18 07/31/2021   CO2 28 07/31/2021   TSH 2.83 01/31/2021   HGBA1C 6.6 (H) 07/31/2021   MICROALBUR <0.7 07/31/2021    DG Chest 2 View  Result Date: 07/21/2018 CLINICAL DATA:  Chest wall pain status post fall 3 weeks ago EXAM: CHEST - 2 VIEW COMPARISON:  07/27/2016 FINDINGS: The heart size and mediastinal contours are within normal limits. Both lungs are clear. The visualized skeletal structures are unremarkable. IMPRESSION: No active cardiopulmonary disease. Electronically Signed   By: Kathreen Devoid   On: 07/21/2018 10:17   US Abdomen Limited RUQ (LIVER/GB)  Result Date: 07/31/2021 CLINICAL DATA:  RIGHT upper quadrant pain for 10 days EXAM: ULTRASOUND ABDOMEN LIMITED RIGHT UPPER QUADRANT COMPARISON:  None Available. FINDINGS: Gallbladder: No gallstones or wall thickening visualized. No sonographic Murphy sign noted by sonographer. Common bile duct: Diameter: Normal at 4 mm Liver: No focal lesion identified. Within normal limits in parenchymal echogenicity. Portal vein is patent on color Doppler imaging with normal direction of blood flow towards the liver. Other: None. IMPRESSION: 1. Normal RIGHT upper quadrant ultrasound. 2. No evidence of cholecystitis. Electronically Signed   By: Suzy Bouchard M.D.   On: 07/31/2021 12:57   Home sleep test  Result Date: 05/15/2021 Star Age, MD     05/18/2021  6:19 PM  [facility] HOME SLEEP TEST (Watch PAT) REPORT STUDY DATE: 05/15/2021  DOB: 11/05/1955 MRN: [id number] ORDERING CLINICIAN: Star Age, MD, PhD  REFERRING CLINICIAN: Janith Lima, MD CLINICAL INFORMATION/HISTORY: 66 year old right-handed woman with an underlying medical history of diabetes, hypertension, chronic kidney disease, allergies and obesity, who reports snoring and excessive daytime somnolence. Epworth sleepiness score: 4/24. BMI: 35.1 kg/m FINDINGS: Sleep Summary: Total Recording Time (hours, min): 10 hours, 3 minutes Total Sleep Time (hours, min):  8 hours, 36 minutes Percent REM (%):    26.1% Respiratory Indices: Calculated pAHI (per hour):  12.2/hour       REM pAHI:    29.2/hour     NREM pAHI: 6.1/hour Oxygen Saturation  Statistics:  Oxygen Saturation (%) Mean: 94% Minimum oxygen saturation (%):                 85% O2 Saturation Range (%): 85-99%  O2 Saturation (minutes) <=88%: 0.3 min Pulse Rate Statistics: Pulse Mean (bpm):    84/min  Pulse Range (59-106/min) IMPRESSION: OSA (obstructive sleep apnea) RECOMMENDATION: This home sleep test demonstrates overall mild obstructive sleep apnea with a total AHI of 12.2/hour and O2 nadir of 85%.  Intermittent mild to moderate snoring was detected.  Given the patient's medical history and sleep related complaints, treatment with positive airway pressure is recommended. This can be achieved in the form of autoPAP trial/titration at home. A  full night CPAP titration study will help with proper treatment settings and mask fitting if needed. Alternative treatments include weight loss along with avoidance of the supine sleep position, or an oral appliance in appropriate candidates.  Please note that untreated obstructive sleep apnea may carry additional perioperative morbidity. Patients with significant obstructive sleep apnea should receive perioperative PAP therapy and the surgeons and particularly the anesthesiologist should be informed of the diagnosis and the severity of the sleep disordered breathing. The patient should be  cautioned not to drive, work at heights, or operate dangerous or heavy equipment when tired or sleepy. Review and reiteration of good sleep hygiene measures should be pursued with any patient. Other causes of the patient's symptoms, including circadian rhythm disturbances, an underlying mood disorder, medication effect and/or an underlying medical problem cannot be ruled out based on this test. Clinical correlation is recommended. The patient and her referring provider will be notified of the test results. The patient will be seen in follow up in sleep clinic at [facility]. I certify that I have reviewed the raw data recording prior to the issuance of this report in accordance with the standards of the American Academy of Sleep Medicine (AASM). INTERPRETING PHYSICIAN: Star Age, MD, PhD Board Certified in Neurology and Sleep Medicine [facility] [address], [place], [place] [address] [phone]      Assessment & Plan:   Michelina was seen today for annual exam, hypertension, hyperlipidemia, diabetes and abdominal pain.  Diagnoses and all orders for this visit:  Essential hypertension- Her blood pressure is adequately well controlled. -     Basic metabolic panel; Future -     Basic metabolic panel  Type 2 diabetes mellitus with complication, without long-term current use of insulin ([place])- Her blood sugar is well controlled. -     Basic metabolic panel; Future -     Hemoglobin A1c; Future -     Microalbumin / creatinine urine ratio; Future -     Continuous Blood Gluc Sensor (FREESTYLE LIBRE 14 DAY SENSOR) MISC; USE AS DIRECTED -     HM Diabetes Foot Exam -     Microalbumin / creatinine urine ratio -     Hemoglobin A1c -     Basic metabolic panel  Stage 3a chronic kidney disease ([place])- Her renal function is stable. -     Basic metabolic panel; Future -     Urinalysis, Routine w reflex microscopic; Future -     Microalbumin / creatinine urine ratio; Future -      Microalbumin / creatinine urine ratio -     Urinalysis, Routine w reflex microscopic -     Basic metabolic panel  Hyperlipidemia with target LDL less than 100- LDL goal achieved. Doing well on the statin  -  Lipid panel; Future -     Lipid panel  Routine health maintenance- Exam completed, labs reviewed, vaccines reviewed and updated, cancer screenings are up-to-date, patient education was given.  Paraproteinemia- Her total protein remains mildly elevated.  The other labs are reassuring that there is no lymphoproliferative disease. Will continue to monitor. -     He.  Patic function panel; Future -     CBC with Differential/Platelet; Future -     CBC with Differential/Platelet -     Hepatic function panel  Right upper quadrant abdominal pain- She has a paucity of other symptoms.  Her labs are reassuring.  The ultrasound is normal.  Will continue to follow. -     US Abdomen Limited RUQ (LIVER/GB); Future  Need for vaccination -     Pneumococcal conjugate vaccine 20-valent (Prevnar 20)  Other orders -     Tdap vaccine greater than or equal to 7yo IM   I am having [name] maintain her metFORMIN, Kerendia, lidocaine, fluticasone, pioglitazone, Farxiga, atorvastatin, and FreeStyle Libre 14 Day Sensor.  Meds ordered this encounter  Medications   Continuous Blood Gluc Sensor (FREESTYLE LIBRE 14 DAY SENSOR) MISC    Sig: USE AS DIRECTED    Dispense:  6 each    Refill:  3   In addition to time spent on CPE, I spent 45 minutes in preparing to see the patient by review of recent labs, imaging and procedures, obtaining and reviewing separately obtained history, communicating with the patient, ordering medications and an U/S, and documenting clinical information in the EHR including the differential Dx, treatment, and any further evaluation and management of multiple complex medical issues.     Follow-up: Return in about 3 months (around 10/31/2021).  Scarlette Calico, MD

## 2021-08-10 LAB — HM MAMMOGRAPHY: HM Mammogram: NORMAL (ref 0–4)

## 2021-08-15 ENCOUNTER — Ambulatory Visit (INDEPENDENT_AMBULATORY_CARE_PROVIDER_SITE_OTHER): Payer: BC Managed Care – PPO | Admitting: Neurology

## 2021-08-15 ENCOUNTER — Encounter: Payer: Self-pay | Admitting: Neurology

## 2021-08-15 VITALS — BP 104/64 | HR 78 | Ht 59.0 in | Wt 175.0 lb

## 2021-08-15 DIAGNOSIS — Z9989 Dependence on other enabling machines and devices: Secondary | ICD-10-CM

## 2021-08-15 DIAGNOSIS — G4733 Obstructive sleep apnea (adult) (pediatric): Secondary | ICD-10-CM

## 2021-08-15 NOTE — Patient Instructions (Signed)
Please continue using your autoPAP regularly. While your insurance requires that you use PAP at least 4 hours each night on 70% of the nights, I recommend, that you not skip any nights and use it throughout the night if you can. Getting used to PAP and staying with the treatment long term does take time and patience and discipline. Untreated obstructive sleep apnea when it is moderate to severe can have an adverse impact on cardiovascular health and raise her risk for heart disease, arrhythmias, hypertension, congestive heart failure, stroke and diabetes. Untreated obstructive sleep apnea causes sleep disruption, nonrestorative sleep, and sleep deprivation. This can have an impact on your day to day functioning and cause daytime sleepiness and impairment of cognitive function, memory loss, mood disturbance, and problems focussing. Using PAP regularly can improve these symptoms.   We can see you in 1 year, you can see one of our nurse practitioners.

## 2021-08-15 NOTE — Progress Notes (Signed)
Subjective:    Patient ID: Angel Gordon is a 66 y.o. female.  HPI    Interim history:   Angel Gordon is a 66 year old right-handed woman with an underlying medical history of diabetes, hypertension, chronic kidney disease, allergies and obesity, who presents for follow-up consultation of her obstructive sleep apnea after interim testing and starting AutoPap therapy.  The patient is unaccompanied today.  I first met her at the request of her primary care physician on 04/21/2021, at which time she reported snoring and daytime somnolence.  She was advised to proceed with a sleep study.  She had a home sleep test on 05/15/2021 which indicated overall mild obstructive sleep apnea with a total AHI of 12.2/hour and O2 nadir of 85%.  Intermittent mild to moderate snoring was detected.  She was advised to start AutoPap therapy.  Her set up date was 06/09/2021.  She has a ResMed AirSense 11 AutoSet machine.  Today, 08/15/2021: I reviewed her AutoPap compliance data from 07/15/2021 through 08/13/2021, which is a total of 30 days, during which time she used her machine 29 days with percent use days greater than 4 hours at 67%, indicating mildly suboptimal compliance with an average usage of 4 hours and 47 minutes, residual AHI at goal at 3.7/h, 95th percentile of pressure at 9.9 cm with a range of 5 to 11 cm with EPR, leak on the higher side with the 95th percentile at 22.9 L/min.  In the month of early June through July 2023, percent use days greater than 4 hours at 87%, average usage of 6 hours and 33 minutes.  She reports having done well, she feels a little better with regards to her daytime somnolence.  Epworth sleepiness score is 3 out of 24.  Unfortunately, she has had a setback recently.  She had a pinched nerve situation in the neck with radiating pain to the right arm.  She received a cortisone injection under orthopedics.  In late June she fractured her right ankle and needed to be put in a boot.  She has to  wear the boot for about 3 months.  She takes it off at night and puts the foot on a pillow but does have discomfort which affects her sleep.  This explains the decline in her usage lately.  She is motivated to continue with treatment.  She initially started using a nasal cushion interface size small but then she switched out the insert to a large size which was too large for her.  She then switched recently to a under the nose style fullface mask and likes it better.  Her leak has improved recently as well.  Her leak increased when she uses the large nasal cushion insert.    The patient's allergies, current medications, family history, past medical history, past social history, past surgical history and problem list were reviewed and updated as appropriate.   Previously:   04/21/21: (She) reports snoring and excessive daytime somnolence. I reviewed your office note from 01/31/2021.  Her Epworth sleepiness score is 4/24, fatigue severity score is 32/63. She lives with her husband, who does not sleep in the same room with her. Her husband's GC live with them, ages 38 and 68. She has one son and one grandson.  She has no FHx of OSA. She has been told by a friend recently, that her snoring is loud. She has stayed with her friend recently to support her friend at the time of her grief. Patient usually has a  bedtime of about 7-9 PM but lately it is around midnight. Her rise time is 5-6 AM. She has nocturia of 1-2 per night. She has no recurrent AM HAs. She does not drink caffeine daily, non-smoker and does not drink alcohol.   Her Past Medical History Is Significant For: Past Medical History:  Diagnosis Date   Allergy    seasonal   Post-menopausal bleeding 03/16/2019   pt reports going through menopause in her early 32s, no periods for many years, then states now she has a "return of her periods" and is not in menopause.  let pt know to call her gyne to report this bleeding, that it is considered bleeding not  her period since she is post menopausal, pt verb understanding.   Type II or unspecified type diabetes mellitus without mention of complication, not stated as uncontrolled    metformin 563m bid-    Her Past Surgical History Is Significant For: Past Surgical History:  Procedure Laterality Date   COLONOSCOPY  2010   DILATION AND CURETTAGE OF UTERUS     FOOT SURGERY  2002   HYSTEROSCOPY WITH D & C  08/17/2011   Procedure: DILATATION AND CURETTAGE /HYSTEROSCOPY;  Surgeon: GMarylynn Pearson MD;  [facility];  Service: Gynecology;  Laterality: N/A;   TUBAL LIGATION      Her Family History Is Significant For: Family History  Problem Relation Age of Onset   Hypertension Mother    Stroke Mother    Kidney disease Mother    Thyroid disease Mother    Cancer Mother    Heart disease Mother    Hypertension Son    Diabetes Brother    Cancer Maternal Grandmother        colon   Kidney disease Maternal Grandmother    Stroke Maternal Grandmother    Colon cancer Maternal Grandmother    Colon polyps Maternal Grandmother    Liver cancer Brother    Drug abuse Neg Hx    Early death Neg Hx    Hearing loss Neg Hx    Hyperlipidemia Neg Hx    Learning disabilities Neg Hx    Esophageal cancer Neg Hx    Rectal cancer Neg Hx    Stomach cancer Neg Hx     Her Social History Is Significant For: Social History   Socioeconomic History   Marital status: Married    Spouse name: Not on file   Number of children: 1   Years of education: 12+   Highest education level: Not on file  Occupational History    Employer: [employer]  Tobacco Use   Smoking status: Never   Smokeless tobacco: Never  Vaping Use   Vaping Use: Never used  Substance and Sexual Activity   Alcohol use: Never   Drug use: Never   Sexual activity: Yes    Birth control/protection: Post-menopausal  Other Topics Concern   Not on file  Social History Narrative   [employer], [employer] - classes, married '78 - 346years, divorced; married '96.  1 son ' '79; 3 grandchildren. [place]  Social Determinants of HRadio broadcast assistantStrain: Not on fComcastInsecurity: Not on file  Transportation Needs: Not on file  Physical Activity: Not on file  Stress: Not on file  Social Connections: Not on file    Her Allergies Are:  No Known Allergies:   Her Current Medications Are:  Outpatient Encounter Medications as of 08/15/2021  Medication Sig   atorvastatin (LIPITOR)  10 MG tablet TAKE 1 TABLET DAILY   Continuous Blood Gluc Sensor (FREESTYLE LIBRE 14 DAY SENSOR) MISC USE AS DIRECTED   FARXIGA 10 MG TABS tablet TAKE 1 TABLET DAILY   fluticasone (FLONASE) 50 MCG/ACT nasal spray Place 2 sprays into both nostrils daily.   KERENDIA 20 MG TABS Take 1 tablet by mouth daily.   lidocaine (XYLOCAINE) 2 % solution Use as directed 15 mLs in the mouth or throat every 4 (four) hours as needed for mouth pain.   metFORMIN (GLUCOPHAGE) 500 MG tablet TAKE 1 TABLET TWICE A DAY   pioglitazone (ACTOS) 15 MG tablet TAKE 1 TABLET DAILY   No facility-administered encounter medications on file as of 08/15/2021.  :  Review of Systems:  Out of a complete 14 point review of systems, all are reviewed and negative with the exception of these symptoms as listed below: Review of Systems  Neurological:        Pt here for CPAP follow up  Pt states everything is going great Pt states no questions or concerns for this visit    ESS:3     Objective:  Neurological Exam  Physical Exam Physical Examination:   Vitals:   08/15/21 1403  BP: 104/64  Pulse: 78    General Examination: The patient is a very pleasant 66 y.o. female in no acute distress. She appears well-developed and well-nourished and well groomed.   HEENT: Normocephalic, atraumatic, pupils are equal, round and reactive to light, extraocular tracking is good without limitation to gaze excursion or nystagmus noted. Hearing is grossly intact. Face is symmetric with normal facial  animation. Speech is clear with no dysarthria noted. There is no hypophonia. There is no lip, neck/head, jaw or voice tremor. Neck with FROM. There are no carotid bruits on auscultation. Oropharynx exam reveals: mild mouth dryness, adequate dental hygiene and moderate airway crowding. Tongue protrudes centrally and palate elevates symmetrically.    Chest: clear to auscultation.   Abdomen: Soft, non-tender and non-distended.   Extremities: Right foot in a boot.    Skin: Warm and dry without trophic changes noted.    Musculoskeletal: exam reveals no obvious joint deformities.    Neurologically:  Mental status: The patient is awake, alert and oriented in all 4 spheres. Her immediate and remote memory, attention, language skills and fund of knowledge are appropriate. There is no evidence of aphasia, agnosia, apraxia or anomia. Speech is clear with normal prosody and enunciation. Thought process is linear. Mood is normal and affect is normal.  Cranial nerves II - XII are as described above under HEENT exam.  Motor exam: Normal bulk, strength and tone is noted. There is no obvious tremor. Fine motor skills and coordination: grossly intact.  Cerebellar testing: No dysmetria or intention tremor. There is no truncal or gait ataxia.  Sensory exam: intact to light touch in the upper and lower extremities.  Gait, station and balance: She stands with mild difficulty, she walks with a limp on a walking boot.  No walking aid.     Assessment and Plan:  In summary, Angel Gordon is a very pleasant 66 year old female with an underlying medical history of diabetes, hypertension, chronic kidney disease, allergies and obesity, who presents for follow-up consultation of her obstructive sleep apnea.  Her overall AHI on her home sleep test from 05/15/2021 was 12.2/h, O2 nadir 85% with mild to moderate intermittent snoring detected.  She has established treatment with AutoPap therapy since 06/08/2021 and is compliant  with  treatment.  Lately, she is struggled with the interface and used a large nasal cushion interface as she had 3 different sizes in her back and she really needed the small.  She has recently switched to a under the nose style fullface mask size small and she likes it better.  Her leak has indeed improved a little bit looking at her download.  She has had a setback with pain in her arm and fracture of her right ankle bone requiring a boot.  She has discomfort at night and hence her compliance has declined a little bit.  She is benefiting from treatment and is certainly very motivated to continue with it.  She is commended for her treatment adherence and advised to continue with her current interface and the current pressure settings, apnea scores at goal.  At this juncture, she is advised to follow-up routinely to see one of our nurse practitioners in sleep clinic in 1 year.  We talked about the importance of changing supplies on a regular basis.  She is advised to use the humidifier in the machine as well.  Sometimes she forgets to put water in it.  I answered all her questions today, we reviewed her home sleep test results from April 2023 and reviewed her compliance data in detail.  She was in agreement with our plan.  I spent 30 minutes in total face-to-face time and in reviewing records during pre-charting, more than 50% of which was spent in counseling and coordination of care, reviewing test results, reviewing medications and treatment regimen and/or in discussing or reviewing the diagnosis of OSA, the prognosis and treatment options. Pertinent laboratory and imaging test results that were available during this visit with the patient were reviewed by me and considered in my medical decision making (see chart for details).

## 2021-09-12 ENCOUNTER — Other Ambulatory Visit: Payer: Self-pay | Admitting: Internal Medicine

## 2021-09-13 LAB — HM DEXA SCAN

## 2021-09-14 ENCOUNTER — Other Ambulatory Visit: Payer: Self-pay | Admitting: Internal Medicine

## 2021-09-14 DIAGNOSIS — E118 Type 2 diabetes mellitus with unspecified complications: Secondary | ICD-10-CM

## 2021-09-14 DIAGNOSIS — K760 Fatty (change of) liver, not elsewhere classified: Secondary | ICD-10-CM

## 2021-09-15 ENCOUNTER — Encounter: Payer: Self-pay | Admitting: Internal Medicine

## 2021-09-29 NOTE — Telephone Encounter (Signed)
I was able to speak with the pt and she confirmed that she has had a DEXA scan done in July and it has stated her condition has gotten worse. I went ahead and sent over a request for bother the DEXA scan results and her Mammo that was done in August.

## 2021-10-02 ENCOUNTER — Other Ambulatory Visit: Payer: Self-pay | Admitting: Internal Medicine

## 2021-10-02 DIAGNOSIS — E118 Type 2 diabetes mellitus with unspecified complications: Secondary | ICD-10-CM

## 2021-10-30 ENCOUNTER — Ambulatory Visit: Payer: BC Managed Care – PPO | Admitting: Internal Medicine

## 2021-11-02 ENCOUNTER — Encounter: Payer: Self-pay | Admitting: Internal Medicine

## 2021-11-02 ENCOUNTER — Telehealth: Payer: Self-pay

## 2021-11-02 ENCOUNTER — Other Ambulatory Visit: Payer: Self-pay | Admitting: Internal Medicine

## 2021-11-02 ENCOUNTER — Ambulatory Visit (INDEPENDENT_AMBULATORY_CARE_PROVIDER_SITE_OTHER): Payer: BC Managed Care – PPO | Admitting: Internal Medicine

## 2021-11-02 VITALS — BP 118/62 | HR 69 | Temp 98.3°F | Resp 16 | Wt 184.0 lb

## 2021-11-02 DIAGNOSIS — E118 Type 2 diabetes mellitus with unspecified complications: Secondary | ICD-10-CM

## 2021-11-02 DIAGNOSIS — N1831 Chronic kidney disease, stage 3a: Secondary | ICD-10-CM

## 2021-11-02 DIAGNOSIS — K2100 Gastro-esophageal reflux disease with esophagitis, without bleeding: Secondary | ICD-10-CM | POA: Diagnosis not present

## 2021-11-02 LAB — BASIC METABOLIC PANEL
BUN: 19 mg/dL (ref 6–23)
CO2: 30 mEq/L (ref 19–32)
Calcium: 9.8 mg/dL (ref 8.4–10.5)
Chloride: 103 mEq/L (ref 96–112)
Creatinine, Ser: 1.08 mg/dL (ref 0.40–1.20)
GFR: 54.52 mL/min — ABNORMAL LOW (ref >60.00)
Glucose, Bld: 77 mg/dL (ref 70–99)
Potassium: 3.9 mEq/L (ref 3.5–5.1)
Sodium: 140 mEq/L (ref 135–145)

## 2021-11-02 LAB — HEMOGLOBIN A1C: Hgb A1c MFr Bld: 6.7 % — ABNORMAL HIGH (ref 4.6–6.5)

## 2021-11-02 MED ORDER — DEXLANSOPRAZOLE 60 MG PO CPDR: 60.0000 mg | DELAYED_RELEASE_CAPSULE | Freq: Every day | ORAL | 1 refills | Status: DC

## 2021-11-02 MED ORDER — FREESTYLE LIBRE 3 SENSOR MISC: 1.0000 | Freq: Every day | 5 refills | Status: DC

## 2021-11-02 NOTE — Patient Instructions (Signed)
                                                                    [email or web address]                       

## 2021-11-02 NOTE — Telephone Encounter (Signed)
Key: [id number]   Approved thorough the date of 11/02/2022

## 2021-11-02 NOTE — Progress Notes (Signed)
Subjective:  Patient ID: Angel Gordon, female    DOB: 15-Nov-1955  Age: 66 y.o. MRN: [id number]  CC: Hypertension and Diabetes   HPI Angel Gordon presents for f/up -  She walks about 12k steps per day.  Her endurance is good and she denies chest pain, shortness of breath, edema, or fatigue.  She was not willing to get vaccinated against influenza, pneumonia, or shingles.  She complains of worsening heartburn.  She denies odynophagia, dysphagia, loss of appetite, or weight loss.  Outpatient Medications Prior to Visit  Medication Sig Dispense Refill   atorvastatin (LIPITOR) 10 MG tablet TAKE 1 TABLET DAILY 90 tablet 1   FARXIGA 10 MG TABS tablet TAKE 1 TABLET DAILY 90 tablet 1   fluticasone (FLONASE) 50 MCG/ACT nasal spray Place 2 sprays into both nostrils daily. 48 g 1   KERENDIA 20 MG TABS TAKE 1 TABLET BY MOUTH EVERY DAY 90 tablet 1   lidocaine (XYLOCAINE) 2 % solution Use as directed 15 mLs in the mouth or throat every 4 (four) hours as needed for mouth pain. 100 mL 0   metFORMIN (GLUCOPHAGE) 500 MG tablet TAKE 1 TABLET TWICE A DAY 180 tablet 1   Continuous Blood Gluc Sensor (FREESTYLE LIBRE 14 DAY SENSOR) MISC USE AS DIRECTED 6 each 3   No facility-administered medications prior to visit.    ROS Review of Systems  Constitutional:  Positive for unexpected weight change (wt gain). Negative for chills, diaphoresis and fatigue.  HENT: Negative.    Eyes: Negative.   Respiratory:  Negative for cough, chest tightness, shortness of breath and wheezing.   Cardiovascular:  Negative for chest pain, palpitations and leg swelling.  Gastrointestinal:  Negative for abdominal pain, diarrhea, nausea and vomiting.       ++heartburn  Endocrine: Negative.   Genitourinary: Negative.  Negative for difficulty urinating.  Musculoskeletal: Negative.  Negative for myalgias.  Skin: Negative.   Neurological:  Negative for dizziness, weakness, light-headedness and headaches.  Hematological:   Negative for adenopathy. Does not bruise/bleed easily.  Psychiatric/Behavioral: Negative.      Objective:  BP 118/62 (BP Location: Left Arm, Patient Position: Sitting, Cuff Size: Large)   Pulse 69   Temp 98.3 F (36.8 C) (Oral)   Resp 16   Wt 184 lb (83.5 kg)   LMP 02/23/2011 Comment: pt states she has had recent return of "periods" after going through menopause in her early 56s, instruct pt to call her Gyn to report this, pt verb understanding   SpO2 99%   BMI 37.16 kg/m   BP Readings from Last 3 Encounters:  11/02/21 118/62  08/15/21 104/64  07/31/21 134/82    Wt Readings from Last 3 Encounters:  11/02/21 184 lb (83.5 kg)  08/15/21 175 lb (79.4 kg)  07/31/21 173 lb (78.5 kg)    Physical Exam Vitals reviewed.  Constitutional:      Appearance: Normal appearance.  HENT:     Mouth/Throat:     Mouth: Mucous membranes are moist.  Eyes:     General: No scleral icterus.    Conjunctiva/sclera: Conjunctivae normal.  Cardiovascular:     Rate and Rhythm: Normal rate and regular rhythm. Occasional Extrasystoles are present.    Heart sounds: No murmur heard. Pulmonary:     Effort: Pulmonary effort is normal.     Breath sounds: No stridor. No wheezing, rhonchi or rales.  Abdominal:     General: Abdomen is flat.     Palpations: There is no  mass.     Tenderness: There is no abdominal tenderness. There is no guarding.     Hernia: No hernia is present.  Musculoskeletal:        General: Normal range of motion.     Cervical back: Neck supple.     Right lower leg: No edema.     Left lower leg: No edema.  Lymphadenopathy:     Cervical: No cervical adenopathy.  Skin:    General: Skin is warm and dry.  Neurological:     General: No focal deficit present.     Mental Status: She is alert.  Psychiatric:        Mood and Affect: Mood normal.        Behavior: Behavior normal.     Lab Results  Component Value Date   WBC 5.3 07/31/2021   HGB 12.4 07/31/2021   HCT 39.0  07/31/2021   PLT 203.0 07/31/2021   GLUCOSE 77 11/02/2021   CHOL 102 07/31/2021   TRIG 37.0 07/31/2021   HDL 55.40 07/31/2021   LDLCALC 39 07/31/2021   ALT 16 07/31/2021   AST 26 07/31/2021   NA 140 11/02/2021   K 3.9 11/02/2021   CL 103 11/02/2021   CREATININE 1.08 11/02/2021   BUN 19 11/02/2021   CO2 30 11/02/2021   TSH 2.83 01/31/2021   HGBA1C 6.7 (H) 11/02/2021   MICROALBUR <0.7 07/31/2021    US Abdomen Limited RUQ (LIVER/GB)  Result Date: 07/31/2021 CLINICAL DATA:  RIGHT upper quadrant pain for 10 days EXAM: ULTRASOUND ABDOMEN LIMITED RIGHT UPPER QUADRANT COMPARISON:  None Available. FINDINGS: Gallbladder: No gallstones or wall thickening visualized. No sonographic Murphy sign noted by sonographer. Common bile duct: Diameter: Normal at 4 mm Liver: No focal lesion identified. Within normal limits in parenchymal echogenicity. Portal vein is patent on color Doppler imaging with normal direction of blood flow towards the liver. Other: None. IMPRESSION: 1. Normal RIGHT upper quadrant ultrasound. 2. No evidence of cholecystitis. Electronically Signed   By: Suzy Bouchard M.D.   On: 07/31/2021 12:57    Assessment & Plan:   Angel Gordon was seen today for hypertension and diabetes.  Diagnoses and all orders for this visit:  Type 2 diabetes mellitus with complication, without long-term current use of insulin ([place])- Her blood sugar is well controlled. -     Basic metabolic panel; Future -     Hemoglobin A1c; Future -     Continuous Blood Gluc Sensor (FREESTYLE LIBRE 3 SENSOR) MISC; 1 Act by Does not apply route daily. Place 1 sensor on the skin every 14 days. Use to check glucose continuously -     Hemoglobin A1c -     Basic metabolic panel  Stage 3a chronic kidney disease ([place])- Renal function is stable.  Will continue risk factor modifications. -     Basic metabolic panel; Future -     Basic metabolic panel  Gastroesophageal reflux disease with esophagitis without hemorrhage-  Will treat with a PPI. -     dexlansoprazole (DEXILANT) 60 MG capsule; Take 1 capsule (60 mg total) by mouth daily.   I am having Angel Gordon start on [employer] 3 Sensor and dexlansoprazole. I am also having her maintain her metFORMIN, lidocaine, fluticasone, atorvastatin, Farxiga, and [place].  Meds ordered this encounter  Medications   Continuous Blood Gluc Sensor (FREESTYLE LIBRE 3 SENSOR) MISC    Sig: 1 Act by Does not apply route daily. Place 1 sensor on the skin every 14  days. Use to check glucose continuously    Dispense:  2 each    Refill:  5   dexlansoprazole (DEXILANT) 60 MG capsule    Sig: Take 1 capsule (60 mg total) by mouth daily.    Dispense:  90 capsule    Refill:  1     Follow-up: Return in about 6 months (around 05/04/2022).  Scarlette Calico, MD

## 2021-11-05 ENCOUNTER — Encounter: Payer: Self-pay | Admitting: Internal Medicine

## 2021-11-05 DIAGNOSIS — E118 Type 2 diabetes mellitus with unspecified complications: Secondary | ICD-10-CM

## 2021-11-06 MED ORDER — FREESTYLE LIBRE 3 SENSOR MISC: 1.0000 | Freq: Every day | 5 refills | Status: DC

## 2021-11-08 NOTE — Telephone Encounter (Signed)
Key: [id number]  Approved 10/09/2021 - 11/08/2022

## 2021-11-17 MED ORDER — FREESTYLE LIBRE 3 SENSOR MISC: 1.0000 | Freq: Every day | 5 refills | Status: DC

## 2021-11-17 NOTE — Addendum Note (Signed)
Addended by: Hinda Kehr on: 11/17/2021 10:13 AM   Modules accepted: Orders

## 2022-01-02 ENCOUNTER — Other Ambulatory Visit: Payer: Self-pay | Admitting: Internal Medicine

## 2022-01-02 DIAGNOSIS — E785 Hyperlipidemia, unspecified: Secondary | ICD-10-CM

## 2022-01-04 ENCOUNTER — Telehealth: Payer: Self-pay | Admitting: *Deleted

## 2022-01-04 NOTE — Telephone Encounter (Signed)
Received a fax from [place] stating patient has made the decision to return her cpap machine. I sent the patient a mychart message offering an appointment to discuss alternative treatment options.

## 2022-01-04 NOTE — Telephone Encounter (Signed)
Noted, thank you

## 2022-01-29 ENCOUNTER — Ambulatory Visit: Payer: 59 | Admitting: Podiatry

## 2022-01-29 ENCOUNTER — Encounter: Payer: Self-pay | Admitting: Podiatry

## 2022-01-29 DIAGNOSIS — B351 Tinea unguium: Secondary | ICD-10-CM

## 2022-01-29 DIAGNOSIS — M79675 Pain in left toe(s): Secondary | ICD-10-CM

## 2022-01-29 DIAGNOSIS — E119 Type 2 diabetes mellitus without complications: Secondary | ICD-10-CM

## 2022-01-29 DIAGNOSIS — M79674 Pain in right toe(s): Secondary | ICD-10-CM | POA: Diagnosis not present

## 2022-01-29 NOTE — Progress Notes (Signed)
Subjective:   Patient ID: Angel Gordon, female   DOB: 67 y.o.   MRN: [id number]   HPI Patient presents stating that she has nailbeds she simply cannot take care of long-term and they are incurvated and sore when pressed.   ROS      Objective:  Physical Exam  Ocular status intact thick yellow brittle nailbeds 1-5 both feet long-term diabetic with pain     Assessment:  Chronic mycotic nail infection 1-5 both feet     Plan:  Debridement painful nailbeds 1-5 both feet neurogenic bleeding reappoint routine care

## 2022-02-02 LAB — HM DIABETES EYE EXAM

## 2022-02-20 ENCOUNTER — Other Ambulatory Visit: Payer: Self-pay | Admitting: Internal Medicine

## 2022-02-20 DIAGNOSIS — E118 Type 2 diabetes mellitus with unspecified complications: Secondary | ICD-10-CM

## 2022-04-02 ENCOUNTER — Other Ambulatory Visit: Payer: Self-pay | Admitting: Internal Medicine

## 2022-04-02 DIAGNOSIS — E118 Type 2 diabetes mellitus with unspecified complications: Secondary | ICD-10-CM

## 2022-04-13 ENCOUNTER — Other Ambulatory Visit (HOSPITAL_COMMUNITY): Payer: Self-pay

## 2022-04-27 ENCOUNTER — Other Ambulatory Visit: Payer: Self-pay | Admitting: Internal Medicine

## 2022-04-27 DIAGNOSIS — N1831 Chronic kidney disease, stage 3a: Secondary | ICD-10-CM

## 2022-04-27 DIAGNOSIS — E118 Type 2 diabetes mellitus with unspecified complications: Secondary | ICD-10-CM

## 2022-05-07 ENCOUNTER — Ambulatory Visit: Payer: 59 | Admitting: Internal Medicine

## 2022-05-07 ENCOUNTER — Encounter: Payer: Self-pay | Admitting: Internal Medicine

## 2022-05-07 VITALS — BP 118/70 | HR 84 | Temp 98.2°F | Ht 59.0 in | Wt 175.0 lb

## 2022-05-07 DIAGNOSIS — R0609 Other forms of dyspnea: Secondary | ICD-10-CM | POA: Diagnosis not present

## 2022-05-07 DIAGNOSIS — N1831 Chronic kidney disease, stage 3a: Secondary | ICD-10-CM | POA: Diagnosis not present

## 2022-05-07 DIAGNOSIS — Z7985 Long-term (current) use of injectable non-insulin antidiabetic drugs: Secondary | ICD-10-CM

## 2022-05-07 DIAGNOSIS — K760 Fatty (change of) liver, not elsewhere classified: Secondary | ICD-10-CM

## 2022-05-07 DIAGNOSIS — E118 Type 2 diabetes mellitus with unspecified complications: Secondary | ICD-10-CM

## 2022-05-07 DIAGNOSIS — I119 Hypertensive heart disease without heart failure: Secondary | ICD-10-CM | POA: Diagnosis not present

## 2022-05-07 DIAGNOSIS — I10 Essential (primary) hypertension: Secondary | ICD-10-CM

## 2022-05-07 DIAGNOSIS — D892 Hypergammaglobulinemia, unspecified: Secondary | ICD-10-CM

## 2022-05-07 NOTE — Progress Notes (Addendum)
Subjective:  Patient ID: Angel Gordon, female    DOB: [birth year]  Age: 67 y.o. MRN: [id number]  CC: Diabetes and Hypertension   HPI CAMBRYN CHARTERS presents for f/up -  She complains that metformin caused diarrhea. She complains of DOE and fatigue but denies CP, diaphoresis, edema.  Outpatient Medications Prior to Visit  Medication Sig Dispense Refill   atorvastatin (LIPITOR) 10 MG tablet TAKE 1 TABLET DAILY 90 tablet 1   FARXIGA 10 MG TABS tablet TAKE 1 TABLET DAILY 90 tablet 0   fluticasone (FLONASE) 50 MCG/ACT nasal spray Place 2 sprays into both nostrils daily. 48 g 1   gabapentin (NEURONTIN) 300 MG capsule Take 300 mg by mouth as needed.     KERENDIA 20 MG TABS TAKE 1 TABLET BY MOUTH EVERY DAY 90 tablet 0   Continuous Blood Gluc Sensor (FREESTYLE LIBRE 3 SENSOR) MISC 1 Act by Does not apply route daily. Place 1 sensor on the skin every 14 days. Use to check glucose continuously 2 each 5   metFORMIN (GLUCOPHAGE) 500 MG tablet TAKE 1 TABLET TWICE A DAY 180 tablet 0   dexlansoprazole (DEXILANT) 60 MG capsule Take 1 capsule (60 mg total) by mouth daily. 90 capsule 1   lidocaine (XYLOCAINE) 2 % solution Use as directed 15 mLs in the mouth or throat every 4 (four) hours as needed for mouth pain. 100 mL 0   No facility-administered medications prior to visit.    ROS Review of Systems  Constitutional:  Positive for fatigue. Negative for chills, diaphoresis and unexpected weight change.  HENT: Negative.    Respiratory:  Positive for shortness of breath. Negative for cough, chest tightness and wheezing.   Cardiovascular:  Negative for chest pain, palpitations and leg swelling.  Gastrointestinal:  Negative for abdominal pain, diarrhea, nausea and vomiting.  Genitourinary: Negative.  Negative for difficulty urinating.  Musculoskeletal:  Positive for arthralgias. Negative for myalgias.  Skin: Negative.  Negative for color change and pallor.  Neurological:  Negative for  dizziness, weakness, light-headedness and headaches.  Hematological:  Negative for adenopathy. Does not bruise/bleed easily.  Psychiatric/Behavioral: Negative.      Objective:  BP 118/70 (BP Location: Left Arm, Patient Position: Sitting, Cuff Size: Normal)   Pulse 84   Temp 98.2 F (36.8 C) (Oral)   Ht 4\' 11"  (1.499 m)   Wt 175 lb (79.4 kg)   LMP 02/23/2011 Comment: pt states she has had recent return of "periods" after going through menopause in her early 75s, instruct pt to call her Gyn to report this, pt verb understanding   SpO2 97%   BMI 35.35 kg/m   BP Readings from Last 3 Encounters:  05/07/22 118/70  11/02/21 118/62  08/15/21 104/64    Wt Readings from Last 3 Encounters:  05/07/22 175 lb (79.4 kg)  11/02/21 184 lb (83.5 kg)  08/15/21 175 lb (79.4 kg)    Physical Exam Vitals reviewed.  HENT:     Nose: Nose normal.     Mouth/Throat:     Mouth: Mucous membranes are moist.  Eyes:     General: No scleral icterus.    Conjunctiva/sclera: Conjunctivae normal.  Cardiovascular:     Rate and Rhythm: Normal rate and regular rhythm.     Heart sounds: No murmur heard.    Comments: EKG- NSR, 75 bpm Minimal LVH No Q waves or ST/T wave changes Pulmonary:     Effort: Pulmonary effort is normal.     Breath sounds: No  stridor. No wheezing, rhonchi or rales.  Abdominal:     General: Abdomen is protuberant. Bowel sounds are normal. There is no distension.     Palpations: Abdomen is soft. There is no hepatomegaly, splenomegaly or mass.     Tenderness: There is no abdominal tenderness.  Musculoskeletal:        General: Normal range of motion.     Cervical back: Neck supple.     Right lower leg: No edema.     Left lower leg: No edema.  Lymphadenopathy:     Cervical: No cervical adenopathy.  Skin:    General: Skin is warm and dry.  Neurological:     General: No focal deficit present.     Mental Status: She is alert.  Psychiatric:        Mood and Affect: Mood normal.         Behavior: Behavior normal.     Lab Results  Component Value Date   WBC 8.8 05/07/2022   HGB 12.8 05/07/2022   HCT 40.4 05/07/2022   PLT 242.0 05/07/2022   GLUCOSE 86 05/07/2022   CHOL 102 07/31/2021   TRIG 37.0 07/31/2021   HDL 55.40 07/31/2021   LDLCALC 39 07/31/2021   ALT 31 05/07/2022   AST 40 (H) 05/07/2022   NA 139 05/07/2022   K 4.9 05/07/2022   CL 102 05/07/2022   CREATININE 1.01 05/07/2022   BUN 16 05/07/2022   CO2 30 05/07/2022   TSH 1.97 05/07/2022   HGBA1C 7.0 (H) 05/07/2022   MICROALBUR <0.7 05/07/2022    US Abdomen Limited RUQ (LIVER/GB)  Result Date: 07/31/2021 CLINICAL DATA:  RIGHT upper quadrant pain for 10 days EXAM: ULTRASOUND ABDOMEN LIMITED RIGHT UPPER QUADRANT COMPARISON:  None Available. FINDINGS: Gallbladder: No gallstones or wall thickening visualized. No sonographic Murphy sign noted by sonographer. Common bile duct: Diameter: Normal at 4 mm Liver: No focal lesion identified. Within normal limits in parenchymal echogenicity. Portal vein is patent on color Doppler imaging with normal direction of blood flow towards the liver. Other: None. IMPRESSION: 1. Normal RIGHT upper quadrant ultrasound. 2. No evidence of cholecystitis. Electronically Signed   By: Genevive Bi M.D.   On: 07/31/2021 12:57    Assessment & Plan:    Fatty liver disease, nonalcoholic  Stage 3a chronic kidney disease- Renal function is stable. -     Urinalysis, Routine w reflex microscopic; Future -     Microalbumin / creatinine urine ratio; Future -     Basic metabolic panel; Future  Type II diabetes mellitus with manifestations- Her A1C is up to 7.0% - will start a GLP/GIP agonist. -     Hemoglobin A1c; Future -     Microalbumin / creatinine urine ratio; Future -     Basic metabolic panel; Future  Paraproteinemia- TP is stable.  -     CBC with Differential/Platelet; Future -     Hepatic function panel; Future  Essential hypertension - Her BP is well controlled. -      Urinalysis, Routine w reflex microscopic; Future -     TSH; Future -     Basic metabolic panel; Future  DOE (dyspnea on exertion)- will evaluate for CHF and CAD. -     CT CARDIAC SCORING (SELF PAY ONLY); Future -     ECHOCARDIOGRAM COMPLETE; Future  LVH (left ventricular hypertrophy) due to hypertensive disease, without heart failure -     ECHOCARDIOGRAM COMPLETE; Future     Follow-up: Return in about  4 months (around 09/06/2022).  Sanda Linger, MD

## 2022-05-07 NOTE — Patient Instructions (Signed)

## 2022-05-08 LAB — HEPATIC FUNCTION PANEL
ALT: 31 U/L (ref 0–35)
AST: 40 U/L — ABNORMAL HIGH (ref 0–37)
Albumin: 4 g/dL (ref 3.5–5.2)
Alkaline Phosphatase: 74 U/L (ref 39–117)
Bilirubin, Direct: 0.1 mg/dL (ref 0.0–0.3)
Total Bilirubin: 0.3 mg/dL (ref 0.2–1.2)
Total Protein: 8.4 g/dL — ABNORMAL HIGH (ref 6.0–8.3)

## 2022-05-08 LAB — CBC WITH DIFFERENTIAL/PLATELET
Basophils Absolute: 0 10*3/uL (ref 0.0–0.1)
Basophils Relative: 0.3 % (ref 0.0–3.0)
Eosinophils Absolute: 0.8 10*3/uL — ABNORMAL HIGH (ref 0.0–0.7)
Eosinophils Relative: 9.5 % — ABNORMAL HIGH (ref 0.0–5.0)
HCT: 40.4 % (ref 36.0–46.0)
Hemoglobin: 12.8 g/dL (ref 12.0–15.0)
Lymphocytes Relative: 41.8 % (ref 12.0–46.0)
Lymphs Abs: 3.7 10*3/uL (ref 0.7–4.0)
MCHC: 31.8 g/dL (ref 30.0–36.0)
MCV: 88 fl (ref 78.0–100.0)
Monocytes Absolute: 0.7 10*3/uL (ref 0.1–1.0)
Monocytes Relative: 8.4 % (ref 3.0–12.0)
Neutro Abs: 3.5 10*3/uL (ref 1.4–7.7)
Neutrophils Relative %: 40 % — ABNORMAL LOW (ref 43.0–77.0)
Platelets: 242 10*3/uL (ref 150.0–400.0)
RBC: 4.59 Mil/uL (ref 3.87–5.11)
RDW: 14.7 % (ref 11.5–15.5)
WBC: 8.8 10*3/uL (ref 4.0–10.5)

## 2022-05-08 LAB — URINALYSIS, ROUTINE W REFLEX MICROSCOPIC
Bilirubin Urine: NEGATIVE
Hgb urine dipstick: NEGATIVE
Ketones, ur: NEGATIVE
Leukocytes,Ua: NEGATIVE
Nitrite: NEGATIVE
RBC / HPF: NONE SEEN (ref 0–?)
Specific Gravity, Urine: 1.025 (ref 1.000–1.030)
Total Protein, Urine: NEGATIVE
Urine Glucose: >=1000 — AB
Urobilinogen, UA: 0.2 (ref 0.0–1.0)
pH: 6 (ref 5.0–8.0)

## 2022-05-08 LAB — BASIC METABOLIC PANEL
BUN: 16 mg/dL (ref 6–23)
CO2: 30 mEq/L (ref 19–32)
Calcium: 10.1 mg/dL (ref 8.4–10.5)
Chloride: 102 mEq/L (ref 96–112)
Creatinine, Ser: 1.01 mg/dL (ref 0.40–1.20)
GFR: 58.87 mL/min — ABNORMAL LOW (ref >60.00)
Glucose, Bld: 86 mg/dL (ref 70–99)
Potassium: 4.9 mEq/L (ref 3.5–5.1)
Sodium: 139 mEq/L (ref 135–145)

## 2022-05-08 LAB — MICROALBUMIN / CREATININE URINE RATIO
Creatinine,U: 85.8 mg/dL
Microalb Creat Ratio: 0.8 mg/g (ref 0.0–30.0)
Microalb, Ur: <0.7 mg/dL (ref 0.0–1.9)

## 2022-05-08 LAB — HEMOGLOBIN A1C: Hgb A1c MFr Bld: 7 % — ABNORMAL HIGH (ref 4.6–6.5)

## 2022-05-08 LAB — TSH: TSH: 1.97 u[IU]/mL (ref 0.35–5.50)

## 2022-05-08 MED ORDER — TIRZEPATIDE 2.5 MG/0.5ML ~~LOC~~ SOAJ
2.5000 mg | SUBCUTANEOUS | 0 refills | Status: DC
Start: 2022-05-08 — End: 2022-05-24

## 2022-05-09 ENCOUNTER — Encounter: Payer: Self-pay | Admitting: Internal Medicine

## 2022-05-09 ENCOUNTER — Telehealth: Payer: Self-pay | Admitting: Internal Medicine

## 2022-05-09 NOTE — Telephone Encounter (Signed)
Pt called and said she need PA for to see Radiology or she would have to pay out of pocket.

## 2022-05-09 NOTE — Telephone Encounter (Signed)
Your case has been sent to clinical review. You will be notified via fax within 2 business days if additional clinical information is needed. If you wish to speak with a representative at anytime, please call [phone].   Physician Name: Dr. Sanda Linger Contact: [facility] Physician Address: [address] [place] [address] [place], [place] [id number] Phone Number: [phone]  Fax Number: [phone] Patient Name: Angel Gordon Patient Id: [id number] Insurance Carrier: [id number] Primary Diagnosis Code: R06.09 Description: Other forms of dyspnea Secondary Diagnosis Code:  Description:  CPT Code 42595 Description: CT HEART W CALCIUM WO CONTRAST Case Number: [id number] Review Date: 05/09/2022 11:59:28 AM Expiration Date: N/A Status: Your case has been sent to clinical review. You will be notified via fax within 2 business days if additional clinical information is needed. If you wish to speak with a representative at anytime, please call [phone].

## 2022-05-11 ENCOUNTER — Other Ambulatory Visit: Payer: Self-pay | Admitting: Internal Medicine

## 2022-05-11 ENCOUNTER — Encounter: Payer: Self-pay | Admitting: Internal Medicine

## 2022-05-11 DIAGNOSIS — E118 Type 2 diabetes mellitus with unspecified complications: Secondary | ICD-10-CM

## 2022-05-24 ENCOUNTER — Other Ambulatory Visit: Payer: Self-pay | Admitting: Internal Medicine

## 2022-05-24 DIAGNOSIS — E118 Type 2 diabetes mellitus with unspecified complications: Secondary | ICD-10-CM

## 2022-05-24 DIAGNOSIS — N1831 Chronic kidney disease, stage 3a: Secondary | ICD-10-CM

## 2022-05-24 MED ORDER — TIRZEPATIDE 5 MG/0.5ML ~~LOC~~ SOAJ
5.0000 mg | SUBCUTANEOUS | 0 refills | Status: DC
Start: 2022-05-24 — End: 2022-05-27

## 2022-05-24 MED ORDER — KERENDIA 20 MG PO TABS: 1.0000 | ORAL_TABLET | Freq: Every day | ORAL | 0 refills | Status: DC

## 2022-05-25 ENCOUNTER — Other Ambulatory Visit: Payer: Self-pay | Admitting: Internal Medicine

## 2022-05-25 DIAGNOSIS — E118 Type 2 diabetes mellitus with unspecified complications: Secondary | ICD-10-CM

## 2022-05-25 NOTE — Telephone Encounter (Signed)
Recld msg Product Backordered/Unavailable.

## 2022-05-27 ENCOUNTER — Other Ambulatory Visit: Payer: Self-pay | Admitting: Internal Medicine

## 2022-05-27 DIAGNOSIS — E118 Type 2 diabetes mellitus with unspecified complications: Secondary | ICD-10-CM

## 2022-05-27 MED ORDER — TIRZEPATIDE 7.5 MG/0.5ML ~~LOC~~ SOAJ
7.5000 mg | SUBCUTANEOUS | 0 refills | Status: DC
Start: 2022-05-27 — End: 2022-08-16

## 2022-05-29 NOTE — Addendum Note (Signed)
Addended by: Darryll Capers on: 05/29/2022 04:04 PM   Modules accepted: Orders

## 2022-05-31 ENCOUNTER — Ambulatory Visit (HOSPITAL_COMMUNITY): Payer: 59 | Attending: Internal Medicine

## 2022-05-31 DIAGNOSIS — R9431 Abnormal electrocardiogram [ECG] [EKG]: Secondary | ICD-10-CM

## 2022-05-31 DIAGNOSIS — R0609 Other forms of dyspnea: Secondary | ICD-10-CM | POA: Insufficient documentation

## 2022-05-31 DIAGNOSIS — I119 Hypertensive heart disease without heart failure: Secondary | ICD-10-CM | POA: Insufficient documentation

## 2022-05-31 LAB — ECHOCARDIOGRAM COMPLETE
Area-P 1/2: 3.99 cm2
Calc EF: 52.6 %
S' Lateral: 2.2 cm
Single Plane A2C EF: 55.3 %
Single Plane A4C EF: 51.5 %

## 2022-06-04 ENCOUNTER — Encounter: Payer: Self-pay | Admitting: Internal Medicine

## 2022-06-07 ENCOUNTER — Other Ambulatory Visit: Payer: 59

## 2022-06-08 ENCOUNTER — Encounter: Payer: Self-pay | Admitting: *Deleted

## 2022-06-08 NOTE — Progress Notes (Signed)
Pt attended screening event on 05/26/22 at [facility] [place], where screening result was wnl. Pt shared PCP info and no SDOH insecurities was indicated at the event. Per chart review, Pt PCP was listed as Dr. Etta Grandchild and was seen by PCP on 05/07/22 and 11/02/21. No additional health equity team support indicated at this time. -Lorri Frederick, CCG

## 2022-07-02 ENCOUNTER — Other Ambulatory Visit: Payer: Self-pay | Admitting: Internal Medicine

## 2022-07-02 DIAGNOSIS — E785 Hyperlipidemia, unspecified: Secondary | ICD-10-CM

## 2022-07-02 DIAGNOSIS — E118 Type 2 diabetes mellitus with unspecified complications: Secondary | ICD-10-CM

## 2022-08-16 ENCOUNTER — Other Ambulatory Visit: Payer: Self-pay | Admitting: Internal Medicine

## 2022-08-16 DIAGNOSIS — E118 Type 2 diabetes mellitus with unspecified complications: Secondary | ICD-10-CM

## 2022-08-16 DIAGNOSIS — N1831 Chronic kidney disease, stage 3a: Secondary | ICD-10-CM

## 2022-08-16 DIAGNOSIS — E1122 Type 2 diabetes mellitus with diabetic chronic kidney disease: Secondary | ICD-10-CM

## 2022-08-16 MED ORDER — TIRZEPATIDE 7.5 MG/0.5ML ~~LOC~~ SOAJ
7.5000 mg | SUBCUTANEOUS | 0 refills | Status: DC
Start: 2022-08-16 — End: 2022-10-20

## 2022-08-16 MED ORDER — KERENDIA 20 MG PO TABS
1.0000 | ORAL_TABLET | Freq: Every day | ORAL | 0 refills | Status: DC
Start: 2022-08-16 — End: 2022-11-16

## 2022-08-20 ENCOUNTER — Ambulatory Visit: Payer: 59 | Admitting: Internal Medicine

## 2022-08-20 ENCOUNTER — Encounter: Payer: Self-pay | Admitting: Internal Medicine

## 2022-08-20 VITALS — BP 118/76 | HR 62 | Temp 98.6°F | Resp 16 | Ht 59.0 in | Wt 154.0 lb

## 2022-08-20 DIAGNOSIS — N938 Other specified abnormal uterine and vaginal bleeding: Secondary | ICD-10-CM | POA: Diagnosis not present

## 2022-08-20 DIAGNOSIS — E118 Type 2 diabetes mellitus with unspecified complications: Secondary | ICD-10-CM | POA: Diagnosis not present

## 2022-08-20 DIAGNOSIS — E785 Hyperlipidemia, unspecified: Secondary | ICD-10-CM | POA: Diagnosis not present

## 2022-08-20 DIAGNOSIS — I10 Essential (primary) hypertension: Secondary | ICD-10-CM | POA: Diagnosis not present

## 2022-08-20 DIAGNOSIS — Z0001 Encounter for general adult medical examination with abnormal findings: Secondary | ICD-10-CM | POA: Diagnosis not present

## 2022-08-20 DIAGNOSIS — N1831 Chronic kidney disease, stage 3a: Secondary | ICD-10-CM

## 2022-08-20 DIAGNOSIS — E876 Hypokalemia: Secondary | ICD-10-CM

## 2022-08-20 DIAGNOSIS — E7841 Elevated Lipoprotein(a): Secondary | ICD-10-CM

## 2022-08-20 LAB — BASIC METABOLIC PANEL
BUN: 14 mg/dL (ref 6–23)
CO2: 30 mEq/L (ref 19–32)
Calcium: 10.3 mg/dL (ref 8.4–10.5)
Chloride: 100 mEq/L (ref 96–112)
Creatinine, Ser: 1.02 mg/dL (ref 0.40–1.20)
GFR: 58.06 mL/min — ABNORMAL LOW (ref >60.00)
Glucose, Bld: 91 mg/dL (ref 70–99)
Potassium: 3.4 mEq/L — ABNORMAL LOW (ref 3.5–5.1)
Sodium: 139 mEq/L (ref 135–145)

## 2022-08-20 LAB — LIPID PANEL
Cholesterol: 91 mg/dL (ref 0–200)
HDL: 48.1 mg/dL (ref >39.00)
LDL Cholesterol: 34 mg/dL (ref 0–99)
NonHDL: 42.72
Total CHOL/HDL Ratio: 2
Triglycerides: 43 mg/dL (ref 0.0–149.0)
VLDL: 8.6 mg/dL (ref 0.0–40.0)

## 2022-08-20 LAB — HEMOGLOBIN A1C: Hgb A1c MFr Bld: 5.9 % (ref 4.6–6.5)

## 2022-08-20 MED ORDER — POTASSIUM CHLORIDE ER 10 MEQ PO TBCR
10.0000 meq | EXTENDED_RELEASE_TABLET | Freq: Two times a day (BID) | ORAL | 1 refills | Status: DC
Start: 2022-08-20 — End: 2022-08-20

## 2022-08-20 MED ORDER — POTASSIUM CHLORIDE ER 10 MEQ PO TBCR
10.0000 meq | EXTENDED_RELEASE_TABLET | Freq: Two times a day (BID) | ORAL | 1 refills | Status: AC
Start: 2022-08-20 — End: ?

## 2022-08-20 NOTE — Patient Instructions (Signed)

## 2022-08-20 NOTE — Progress Notes (Unsigned)
Subjective:  Patient ID: Angel Gordon, female    DOB: 06/25/1955  Age: 67 y.o. MRN: [id number]  CC: Annual Exam, Hypertension, Hyperlipidemia, and Diabetes   HPI Angel Gordon presents for a CPX and f/up -  Discussed the use of AI scribe software for clinical note transcription with the patient, who gave verbal consent to proceed.  History of Present Illness   The patient, with a history of diabetes, reports feeling significantly improved and denies any chest pain, shortness of breath, or dizziness. She has been maintaining an active lifestyle, achieving 10,000 to 16,000 steps daily without any leg pain or cramping. She notes increased energy levels and a positive impact on her overall well-being from this increased activity. However, she reports occasional nocturnal hypoglycemia. The patient is due for a mammogram next month.       Outpatient Medications Prior to Visit  Medication Sig Dispense Refill   atorvastatin (LIPITOR) 10 MG tablet TAKE 1 TABLET DAILY 90 tablet 0   Continuous Glucose Sensor (FREESTYLE LIBRE 3 SENSOR) MISC PLACE 1 SENSOR ON THE SKIN EVERY 14 DAYS AND USE DAILY TO CHECK GLUCOSE CONTINUOUSLY 2 each 5   FARXIGA 10 MG TABS tablet TAKE 1 TABLET DAILY 90 tablet 0   Finerenone (KERENDIA) 20 MG TABS Take 1 tablet (20 mg total) by mouth daily. 90 tablet 0   fluticasone (FLONASE) 50 MCG/ACT nasal spray Place 2 sprays into both nostrils daily. 48 g 1   gabapentin (NEURONTIN) 300 MG capsule Take 300 mg by mouth as needed.     tirzepatide (MOUNJARO) 7.5 MG/0.5ML Pen Inject 7.5 mg into the skin once a week. 6 mL 0   No facility-administered medications prior to visit.    ROS Review of Systems  Objective:  BP 118/76 (BP Location: Right Arm, Patient Position: Sitting, Cuff Size: Large)   Pulse 62   Temp 98.6 F (37 C) (Oral)   Resp 16   Ht 4\' 11"  (1.499 m)   Wt 154 lb (69.9 kg)   LMP 02/23/2011 Comment: pt states she has had recent return of "periods" after  going through menopause in her early 60s, instruct pt to call her Gyn to report this, pt verb understanding   SpO2 98%   BMI 31.10 kg/m   BP Readings from Last 3 Encounters:  08/20/22 118/76  05/26/22 116/83  05/07/22 118/70    Wt Readings from Last 3 Encounters:  08/20/22 154 lb (69.9 kg)  05/07/22 175 lb (79.4 kg)  11/02/21 184 lb (83.5 kg)    Physical Exam  Lab Results  Component Value Date   WBC 8.8 05/07/2022   HGB 12.8 05/07/2022   HCT 40.4 05/07/2022   PLT 242.0 05/07/2022   GLUCOSE 91 08/20/2022   CHOL 91 08/20/2022   TRIG 43.0 08/20/2022   HDL 48.10 08/20/2022   LDLCALC 34 08/20/2022   ALT 31 05/07/2022   AST 40 (H) 05/07/2022   NA 139 08/20/2022   K 3.4 (L) 08/20/2022   CL 100 08/20/2022   CREATININE 1.02 08/20/2022   BUN 14 08/20/2022   CO2 30 08/20/2022   TSH 1.97 05/07/2022   HGBA1C 5.9 08/20/2022   MICROALBUR <0.7 05/07/2022    US Abdomen Limited RUQ (LIVER/GB)  Result Date: 07/31/2021 CLINICAL DATA:  RIGHT upper quadrant pain for 10 days EXAM: ULTRASOUND ABDOMEN LIMITED RIGHT UPPER QUADRANT COMPARISON:  None Available. FINDINGS: Gallbladder: No gallstones or wall thickening visualized. No sonographic Murphy sign noted by sonographer. Common bile duct: Diameter:  Normal at 4 mm Liver: No focal lesion identified. Within normal limits in parenchymal echogenicity. Portal vein is patent on color Doppler imaging with normal direction of blood flow towards the liver. Other: None. IMPRESSION: 1. Normal RIGHT upper quadrant ultrasound. 2. No evidence of cholecystitis. Electronically Signed   By: Genevive Bi M.D.   On: 07/31/2021 12:57    Assessment & Plan:  Hyperlipidemia with target LDL less than 100 -     Lipid panel; Future -     Lipoprotein A (LPA); Future  Type II diabetes mellitus with manifestations ([facility]) -     Hemoglobin A1c; Future -     Basic metabolic panel; Future -     HM Diabetes Foot Exam  Essential hypertension -     Basic metabolic  panel; Future -     Urinalysis, Routine w reflex microscopic; Future -     Potassium Chloride ER; Take 1 tablet (10 mEq total) by mouth 2 (two) times daily.  Dispense: 180 tablet; Refill: 1  DUB (dysfunctional uterine bleeding) -     Ambulatory referral to Gynecology  Encounter for general adult medical examination with abnormal findings  Chronic hypokalemia  Stage 3a chronic kidney disease ([facility])     Follow-up: Return in about 6 months (around 02/20/2023).  Sanda Linger, MD

## 2022-08-21 ENCOUNTER — Telehealth: Payer: BC Managed Care – PPO | Admitting: Adult Health

## 2022-08-21 ENCOUNTER — Encounter: Payer: Self-pay | Admitting: Internal Medicine

## 2022-08-21 MED ORDER — POTASSIUM CHLORIDE ER 10 MEQ PO TBCR: 10.0000 meq | EXTENDED_RELEASE_TABLET | Freq: Two times a day (BID) | ORAL | 1 refills | Status: DC

## 2022-08-22 ENCOUNTER — Other Ambulatory Visit: Payer: 59

## 2022-08-22 ENCOUNTER — Other Ambulatory Visit: Payer: Self-pay | Admitting: Obstetrics and Gynecology

## 2022-08-22 DIAGNOSIS — N6001 Solitary cyst of right breast: Secondary | ICD-10-CM

## 2022-08-24 DIAGNOSIS — E7841 Elevated Lipoprotein(a): Secondary | ICD-10-CM | POA: Insufficient documentation

## 2022-08-24 DIAGNOSIS — E876 Hypokalemia: Secondary | ICD-10-CM | POA: Insufficient documentation

## 2022-08-27 ENCOUNTER — Encounter: Payer: Self-pay | Admitting: Internal Medicine

## 2022-09-05 ENCOUNTER — Other Ambulatory Visit: Payer: 59

## 2022-09-05 ENCOUNTER — Other Ambulatory Visit: Payer: Self-pay | Admitting: Obstetrics and Gynecology

## 2022-09-05 ENCOUNTER — Ambulatory Visit
Admission: RE | Admit: 2022-09-05 | Discharge: 2022-09-05 | Disposition: A | Discharge status: Home / Self Care | Payer: 59 | Source: Ambulatory Visit | Attending: Obstetrics and Gynecology | Admitting: Obstetrics and Gynecology

## 2022-09-05 DIAGNOSIS — N6310 Unspecified lump in the right breast, unspecified quadrant: Secondary | ICD-10-CM

## 2022-09-05 DIAGNOSIS — N6001 Solitary cyst of right breast: Secondary | ICD-10-CM

## 2022-09-05 DIAGNOSIS — R599 Enlarged lymph nodes, unspecified: Secondary | ICD-10-CM

## 2022-09-07 ENCOUNTER — Ambulatory Visit: Admission: RE | Admit: 2022-09-07 | Payer: 59 | Source: Ambulatory Visit

## 2022-09-07 ENCOUNTER — Ambulatory Visit
Admission: RE | Admit: 2022-09-07 | Discharge: 2022-09-07 | Disposition: A | Discharge status: Home / Self Care | Payer: 59 | Source: Ambulatory Visit | Attending: Obstetrics and Gynecology | Admitting: Obstetrics and Gynecology

## 2022-09-07 ENCOUNTER — Other Ambulatory Visit (HOSPITAL_COMMUNITY)
Admission: RE | Admit: 2022-09-07 | Discharge: 2022-09-07 | Disposition: A | Discharge status: Home / Self Care | Payer: 59 | Source: Ambulatory Visit | Attending: Obstetrics and Gynecology | Admitting: Obstetrics and Gynecology

## 2022-09-07 DIAGNOSIS — N6310 Unspecified lump in the right breast, unspecified quadrant: Secondary | ICD-10-CM

## 2022-09-07 DIAGNOSIS — R599 Enlarged lymph nodes, unspecified: Secondary | ICD-10-CM

## 2022-09-07 HISTORY — PX: BREAST BIOPSY: SHX20

## 2022-09-11 LAB — SURGICAL PATHOLOGY

## 2022-09-21 ENCOUNTER — Encounter: Payer: Self-pay | Admitting: Internal Medicine

## 2022-10-01 ENCOUNTER — Other Ambulatory Visit: Payer: Self-pay | Admitting: Internal Medicine

## 2022-10-01 DIAGNOSIS — E785 Hyperlipidemia, unspecified: Secondary | ICD-10-CM

## 2022-10-16 ENCOUNTER — Encounter: Payer: Self-pay | Admitting: Internal Medicine

## 2022-10-18 ENCOUNTER — Ambulatory Visit
Admission: RE | Admit: 2022-10-18 | Discharge: 2022-10-18 | Disposition: A | Discharge status: Home / Self Care | Payer: 59 | Source: Ambulatory Visit | Attending: Internal Medicine | Admitting: Internal Medicine

## 2022-10-18 DIAGNOSIS — R0609 Other forms of dyspnea: Secondary | ICD-10-CM

## 2022-10-19 ENCOUNTER — Other Ambulatory Visit: Payer: Self-pay | Admitting: Internal Medicine

## 2022-10-19 DIAGNOSIS — E118 Type 2 diabetes mellitus with unspecified complications: Secondary | ICD-10-CM

## 2022-10-22 ENCOUNTER — Encounter: Payer: Self-pay | Admitting: Internal Medicine

## 2022-10-24 ENCOUNTER — Other Ambulatory Visit: Payer: Self-pay | Admitting: Internal Medicine

## 2022-10-24 ENCOUNTER — Encounter: Payer: Self-pay | Admitting: Internal Medicine

## 2022-10-24 DIAGNOSIS — R918 Other nonspecific abnormal finding of lung field: Secondary | ICD-10-CM | POA: Insufficient documentation

## 2022-11-05 ENCOUNTER — Encounter: Payer: Self-pay | Admitting: Internal Medicine

## 2022-11-06 ENCOUNTER — Other Ambulatory Visit: Payer: Self-pay | Admitting: Internal Medicine

## 2022-11-06 DIAGNOSIS — E118 Type 2 diabetes mellitus with unspecified complications: Secondary | ICD-10-CM

## 2022-11-06 MED ORDER — FREESTYLE LIBRE 3 SENSOR MISC
1.0000 | 5 refills | Status: DC
Start: 2022-11-06 — End: 2022-11-16

## 2022-11-07 ENCOUNTER — Inpatient Hospital Stay
Admission: RE | Admit: 2022-11-07 | Discharge: 2022-11-07 | Discharge status: Home / Self Care | Payer: 59 | Source: Ambulatory Visit | Attending: Internal Medicine | Admitting: Internal Medicine

## 2022-11-07 DIAGNOSIS — R918 Other nonspecific abnormal finding of lung field: Secondary | ICD-10-CM

## 2022-11-08 ENCOUNTER — Other Ambulatory Visit (HOSPITAL_COMMUNITY): Payer: Self-pay

## 2022-11-14 ENCOUNTER — Encounter: Payer: Self-pay | Admitting: Internal Medicine

## 2022-11-16 ENCOUNTER — Other Ambulatory Visit: Payer: Self-pay | Admitting: Internal Medicine

## 2022-11-16 DIAGNOSIS — E118 Type 2 diabetes mellitus with unspecified complications: Secondary | ICD-10-CM

## 2022-11-16 DIAGNOSIS — N1831 Chronic kidney disease, stage 3a: Secondary | ICD-10-CM

## 2022-11-19 ENCOUNTER — Other Ambulatory Visit: Payer: Self-pay

## 2022-11-19 ENCOUNTER — Other Ambulatory Visit: Payer: Self-pay | Admitting: Internal Medicine

## 2022-11-19 DIAGNOSIS — E118 Type 2 diabetes mellitus with unspecified complications: Secondary | ICD-10-CM

## 2022-11-19 MED ORDER — DEXCOM G7 RECEIVER DEVI: 1.0000 | Freq: Every day | 1 refills | Status: DC

## 2022-11-19 MED ORDER — DEXCOM G7 SENSOR MISC: 1.0000 | Freq: Every day | 1 refills | Status: DC

## 2022-11-26 ENCOUNTER — Other Ambulatory Visit: Payer: Self-pay | Admitting: Internal Medicine

## 2022-11-26 DIAGNOSIS — J849 Interstitial pulmonary disease, unspecified: Secondary | ICD-10-CM | POA: Insufficient documentation

## 2022-12-10 ENCOUNTER — Ambulatory Visit: Payer: 59 | Admitting: Pulmonary Disease

## 2022-12-10 ENCOUNTER — Encounter: Payer: Self-pay | Admitting: Pulmonary Disease

## 2022-12-10 VITALS — BP 102/78 | HR 70 | Temp 97.8°F | Ht 59.0 in | Wt 141.4 lb

## 2022-12-10 DIAGNOSIS — J849 Interstitial pulmonary disease, unspecified: Secondary | ICD-10-CM | POA: Diagnosis not present

## 2022-12-10 DIAGNOSIS — E119 Type 2 diabetes mellitus without complications: Secondary | ICD-10-CM

## 2022-12-10 NOTE — Progress Notes (Signed)
Angel Gordon    [id number]    December 24, 1955  Primary Care Physician:Jones, Bernadene Bell, MD  Referring Physician: Etta Grandchild, MD [address] [place],  [place] [address]  Chief complaint: Consult for abnormal CT, dyspnea  HPI: 67 y.o. who  has a past medical history of Allergy, Post-menopausal bleeding (03/16/2019), and Type II or unspecified type diabetes mellitus without mention of complication, not stated as uncontrolled.   Discussed the use of AI scribe software for clinical note transcription with the patient, who gave verbal consent to proceed.  The patient, with a history of diabetes, fatty liver, and stage three chronic kidney disease, presents for a follow-up on recent CT scan results. She was initially evaluated for suspected heart issues, but the cardiac CT scan revealed lung changes instead. The patient reports no noticeable symptoms such as shortness of breath or cough. She maintains an active lifestyle, walking three times a day, and has not noticed any significant changes in her health. She has experienced some weight loss and decreased appetite, which she attributes to a medication change to [facility] shots. She also mentions a slight discomfort in her chest area. The patient has never smoked, has no known exposure to harmful substances, and has no family history of lung disease. She recently had a biopsy for a lump in her right breast, which was not cancerous.   Pets: No pets Occupation: Works in [employer] Exposures: No known exposures.  No mold,, Jacuzzi.  No feather pillows or comforters Smoking history: Never smoker Travel history: No significant travel history Relevant family history: No family history of lung disease   Outpatient Encounter Medications as of 12/10/2022  Medication Sig   atorvastatin (LIPITOR) 10 MG tablet TAKE 1 TABLET DAILY   Continuous Glucose Receiver (DEXCOM G7 RECEIVER) DEVI 1 Act by Does not apply route daily.    Continuous Glucose Sensor (DEXCOM G7 SENSOR) MISC 1 Act by Does not apply route daily.   Continuous Glucose Sensor (FREESTYLE LIBRE 3 SENSOR) MISC PLACE 1 SENSOR ON THE SKIN EVERY 14 DAYS. USE TO CHECK GLUCOSE CONTINUOUSLY   FARXIGA 10 MG TABS tablet TAKE 1 TABLET DAILY   fluticasone (FLONASE) 50 MCG/ACT nasal spray Place 2 sprays into both nostrils daily.   KERENDIA 20 MG TABS TAKE 1 TABLET BY MOUTH EVERY DAY   tirzepatide (MOUNJARO) 7.5 MG/0.5ML Pen INJECT 7.5 MG SUBCUTANEOUSLY WEEKLY   Aspirin 81 MG CAPS Take 1 capsule every day by oral route.   dexlansoprazole (DEXILANT) 60 MG capsule Take 1 tablet by mouth daily.   diclofenac (VOLTAREN) 75 MG EC tablet    gabapentin (NEURONTIN) 300 MG capsule Take 300 mg by mouth as needed. (Patient not taking: Reported on 12/10/2022)   potassium chloride (KLOR-CON 10) 10 MEQ tablet Take 1 tablet (10 mEq total) by mouth 2 (two) times daily. (Patient not taking: Reported on 12/10/2022)   No facility-administered encounter medications on file as of 12/10/2022.    Allergies as of 12/10/2022 - Review Complete 12/10/2022  Allergen Reaction Noted   Metformin and related Diarrhea 05/07/2022    Past Medical History:  Diagnosis Date   Allergy    seasonal   Post-menopausal bleeding 03/16/2019   pt reports going through menopause in her early 50s, no periods for many years, then states now she has a "return of her periods" and is not in menopause.  let pt know to call her gyne to report this bleeding, that it  is considered bleeding not her period since she is post menopausal, pt verb understanding.   Type II or unspecified type diabetes mellitus without mention of complication, not stated as uncontrolled    metformin 500mg  bid-    Past Surgical History:  Procedure Laterality Date   BREAST BIOPSY Right 09/07/2022   [place] RT BREAST BX W LOC DEV 1ST LESION IMG BX SPEC US GUIDE 09/07/2022 [facility]   COLONOSCOPY  2010   DILATION AND CURETTAGE OF UTERUS      FOOT SURGERY  2002   HYSTEROSCOPY WITH D & C  08/17/2011   Procedure: DILATATION AND CURETTAGE /HYSTEROSCOPY;  Surgeon: Zelphia Cairo, MD;  Location: [facility];  Service: Gynecology;  Laterality: N/A;   TUBAL LIGATION      Family History  Problem Relation Age of Onset   Breast cancer Mother        21s   Hypertension Mother    Stroke Mother    Kidney disease Mother    Thyroid disease Mother    Cancer Mother    Heart disease Mother    Cancer Maternal Grandmother        colon   Kidney disease Maternal Grandmother    Stroke Maternal Grandmother    Colon cancer Maternal Grandmother    Colon polyps Maternal Grandmother    Diabetes Brother    Liver cancer Brother    Hypertension Son    Drug abuse Neg Hx    Early death Neg Hx    Hearing loss Neg Hx    Hyperlipidemia Neg Hx    Learning disabilities Neg Hx    Esophageal cancer Neg Hx    Rectal cancer Neg Hx    Stomach cancer Neg Hx     Social History   Socioeconomic History   Marital status: Married    Spouse name: Not on file   Number of children: 1   Years of education: 12+   Highest education level: Not on file  Occupational History    Employer: [employer]  Tobacco Use   Smoking status: Never   Smokeless tobacco: Never  Vaping Use   Vaping status: Never Used  Substance and Sexual Activity   Alcohol use: Never   Drug use: Never   Sexual activity: Yes    Birth control/protection: Post-menopausal  Other Topics Concern   Not on file  Social History Narrative   [employer], [employer] - classes, married '78 - 3 years, divorced; married '96. 1 son ' '79; 3 grandchildren. work: [place]   Social Determinants of Corporate investment banker Strain: Not on [employer] Insecurity: No Food Insecurity (05/26/2022)   Hunger Vital Sign    Worried About Running Out of Food in the Last Year: Never true    Ran Out of Food in the Last Year: Never true  Transportation Needs: No Transportation Needs (05/26/2022)   PRAPARE - Therapist, art (Medical): No    Lack of Transportation (Non-Medical): No  Physical Activity: Not on file  Stress: Not on file  Social Connections: Not on file  Intimate Partner Violence: Not At Risk (05/26/2022)   Humiliation, Afraid, Rape, and Kick questionnaire    Fear of Current or Ex-Partner: No    Emotionally Abused: No    Physically Abused: No    Sexually Abused: No    Review of systems: Review of Systems  Constitutional: Negative for fever and chills.  HENT: Negative.   Eyes: Negative for blurred vision.  Respiratory: as per HPI  Cardiovascular: Negative for chest pain and palpitations.  Gastrointestinal: Negative for vomiting, diarrhea, blood per rectum. Genitourinary: Negative for dysuria, urgency, frequency and hematuria.  Musculoskeletal: Negative for myalgias, back pain and joint pain.  Skin: Negative for itching and rash.  Neurological: Negative for dizziness, tremors, focal weakness, seizures and loss of consciousness.  Endo/Heme/Allergies: Negative for environmental allergies.  Psychiatric/Behavioral: Negative for depression, suicidal ideas and hallucinations.  All other systems reviewed and are negative.  Physical Exam: Blood pressure 102/78, pulse 70, temperature 97.8 F (36.6 C), temperature source Oral, height 4\' 11"  (1.499 m), weight 141 lb 6.4 oz (64.1 kg), last menstrual period 02/23/2011, SpO2 98%. Gen:      No acute distress HEENT:  EOMI, sclera anicteric Neck:     No masses; no thyromegaly Lungs:    Clear to auscultation bilaterally; normal respiratory effort CV:         Regular rate and rhythm; no murmurs Abd:      + bowel sounds; soft, non-tender; no palpable masses, no distension Ext:    No edema; adequate peripheral perfusion Skin:      Warm and dry; no rash Neuro: alert and oriented x 3 Psych: normal mood and affect  Data Reviewed: Imaging: Cardiac CT 10/24/2022-patchy areas of groundglass attenuation, septal thickening with mild  cylindrical bronchiectasis, subcentimeter pulmonary nodules.  High resolution CT 11/26/2022-mild traction bronchiectasis with subpleural groundglass, reticulation with nodularity along the fissures, subcentimeter pulmonary nodules. I reviewed the images personally.  PFTs:  Labs:  Assessment and Plan Interstitial Lung Disease Mild changes noted on recent CT scan. No significant symptoms of dyspnea or cough. No known history of smoking, occupational exposures, or relevant family history. Possible differential diagnoses include sarcoidosis or hypersensitivity pneumonitis.  -Order blood tests to evaluate for autoimmune conditions such as lupus or rheumatoid arthritis. -Order lung function tests. -Follow-up in 3 months to review test results and monitor for symptom progression.  -Continue current management.   Recommendations: Blood test to rule out active tissue disease.  Check angiotensin converting enzyme PFTs  Chilton Greathouse MD [facility] 12/10/2022, 3:46 PM  CC: Etta Grandchild, MD

## 2022-12-10 NOTE — Patient Instructions (Signed)
VISIT SUMMARY:  During today's visit, we reviewed the results of your recent CT scan, which showed some mild changes in your lungs. You reported no symptoms like shortness of breath or cough. We also discussed your ongoing management for diabetes, chronic kidney disease, and fatty liver. You mentioned some weight loss and decreased appetite, which you believe is due to your new medication, Mounjaro shots. Additionally, you noted slight chest discomfort. Your recent breast lump biopsy was benign.  YOUR PLAN:  -INTERSTITIAL LUNG DISEASE: Interstitial lung disease refers to a group of disorders that cause scarring of the lungs. Your CT scan showed mild changes, but you have no significant symptoms. We will conduct blood tests to check for autoimmune conditions and order lung function tests. We will follow up in 3 months to review the results and monitor your symptoms.  -DIABETES: Diabetes is a condition where your blood sugar levels are too high. You are currently managing it with Mounjaro shots, which have led to weight loss and decreased appetite. Continue with your current treatment plan.  INSTRUCTIONS:  Please complete the blood tests and lung function tests as ordered. Follow up in 3 months to review the test results and monitor your symptoms.

## 2022-12-11 ENCOUNTER — Encounter: Payer: Self-pay | Admitting: Pulmonary Disease

## 2022-12-11 ENCOUNTER — Telehealth: Payer: Self-pay | Admitting: Pulmonary Disease

## 2022-12-11 NOTE — Telephone Encounter (Signed)
Patient would like for her lab order to be sent to her primary care doctor. She had a hard time getting blood out yesterday at our office. Her Primary Care is Dr. Sanda Linger at [place] [facility]. Please call patient if this can be done [phone]

## 2022-12-11 NOTE — Telephone Encounter (Signed)
Patient is calling back to see if she can get her blood work done at her pcp office.

## 2022-12-12 ENCOUNTER — Other Ambulatory Visit: Payer: Self-pay

## 2022-12-12 ENCOUNTER — Other Ambulatory Visit (INDEPENDENT_AMBULATORY_CARE_PROVIDER_SITE_OTHER): Payer: 59

## 2022-12-12 ENCOUNTER — Other Ambulatory Visit: Payer: Self-pay | Admitting: Internal Medicine

## 2022-12-12 ENCOUNTER — Encounter: Payer: Self-pay | Admitting: Internal Medicine

## 2022-12-12 DIAGNOSIS — J849 Interstitial pulmonary disease, unspecified: Secondary | ICD-10-CM | POA: Diagnosis not present

## 2022-12-12 DIAGNOSIS — K581 Irritable bowel syndrome with constipation: Secondary | ICD-10-CM | POA: Insufficient documentation

## 2022-12-12 LAB — CK: Total CK: 110 U/L (ref 7–177)

## 2022-12-12 MED ORDER — TRULANCE 3 MG PO TABS: 1.0000 | ORAL_TABLET | Freq: Every day | ORAL | 0 refills | Status: DC

## 2022-12-12 NOTE — Telephone Encounter (Signed)
Labs are in already- I called the [facility] lab (Dr Yetta Barre) and confirmed that they can see the labs- pt aware. Nothing further needed.

## 2022-12-12 NOTE — Telephone Encounter (Signed)
Patient is calling back about blood work being sent to her pcp for her to complete. She can be reached at [phone]

## 2022-12-14 LAB — SJOGRENS SYNDROME-A EXTRACTABLE NUCLEAR ANTIBODY: SSA (Ro) (ENA) Antibody, IgG: <1 AI

## 2022-12-14 LAB — ANTI-NUCLEAR AB-TITER (ANA TITER)
ANA TITER: 1:320 {titer} — ABNORMAL HIGH
ANA Titer 1: 1:40 {titer} — ABNORMAL HIGH

## 2022-12-14 LAB — ANA,IFA RA DIAG PNL W/RFLX TIT/PATN
Anti Nuclear Antibody (ANA): POSITIVE — AB
Cyclic Citrullin Peptide Ab: <16 U
Rheumatoid fact SerPl-aCnc: 36 [IU]/mL — ABNORMAL HIGH (ref <14)

## 2022-12-14 LAB — ANTI-SCLERODERMA ANTIBODY: Scleroderma (Scl-70) (ENA) Antibody, IgG: <1 AI

## 2022-12-14 LAB — ALDOLASE: Aldolase: 4.3 U/L (ref <=8.1)

## 2022-12-14 LAB — ANGIOTENSIN CONVERTING ENZYME: Angiotensin-Converting Enzyme: 26 U/L (ref 9–67)

## 2022-12-14 LAB — SJOGRENS SYNDROME-B EXTRACTABLE NUCLEAR ANTIBODY: SSB (La) (ENA) Antibody, IgG: <1 AI

## 2022-12-17 LAB — HYPERSENSITIVITY PNEUMONITIS
A. Pullulans Abs: NEGATIVE
A.Fumigatus #1 Abs: NEGATIVE
Micropolyspora faeni, IgG: NEGATIVE
Pigeon Serum Abs: NEGATIVE
Thermoact. Saccharii: NEGATIVE
Thermoactinomyces vulgaris, IgG: NEGATIVE

## 2022-12-17 LAB — RNP ANTIBODIES: ENA RNP Ab: <0.2 AI (ref 0.0–0.9)

## 2022-12-17 LAB — ANTI-JO 1 ANTIBODY, IGG: Anti JO-1: <0.2 AI (ref 0.0–0.9)

## 2022-12-17 NOTE — Telephone Encounter (Signed)
Labs drawn

## 2022-12-26 ENCOUNTER — Telehealth: Payer: Self-pay | Admitting: Internal Medicine

## 2022-12-26 NOTE — Telephone Encounter (Signed)
Angel Gordon is calling stating that we will be receiving a fax from her office requesting pts: LAST OFFICE VISIT AVS, LAB RESULTS FOR HER A1C, CBC AND CMP so she can have her surgery done.  Please advise,  Thanks CB # [phone] [facility]

## 2022-12-26 NOTE — Telephone Encounter (Signed)
NOTED

## 2023-01-16 ENCOUNTER — Other Ambulatory Visit: Payer: Self-pay | Admitting: Internal Medicine

## 2023-01-16 DIAGNOSIS — E118 Type 2 diabetes mellitus with unspecified complications: Secondary | ICD-10-CM

## 2023-01-17 ENCOUNTER — Other Ambulatory Visit: Payer: Self-pay | Admitting: Internal Medicine

## 2023-01-17 DIAGNOSIS — E118 Type 2 diabetes mellitus with unspecified complications: Secondary | ICD-10-CM

## 2023-01-19 ENCOUNTER — Other Ambulatory Visit: Payer: Self-pay | Admitting: Internal Medicine

## 2023-01-19 DIAGNOSIS — E118 Type 2 diabetes mellitus with unspecified complications: Secondary | ICD-10-CM

## 2023-01-22 ENCOUNTER — Other Ambulatory Visit (HOSPITAL_COMMUNITY): Payer: Self-pay

## 2023-01-24 ENCOUNTER — Ambulatory Visit: Payer: 59 | Admitting: Internal Medicine

## 2023-01-24 ENCOUNTER — Encounter: Payer: Self-pay | Admitting: Internal Medicine

## 2023-01-24 ENCOUNTER — Other Ambulatory Visit: Payer: Self-pay

## 2023-01-24 ENCOUNTER — Telehealth: Payer: Self-pay

## 2023-01-24 VITALS — BP 122/68 | HR 77 | Temp 97.8°F | Resp 16 | Ht 59.0 in | Wt 137.0 lb

## 2023-01-24 DIAGNOSIS — Z7985 Long-term (current) use of injectable non-insulin antidiabetic drugs: Secondary | ICD-10-CM

## 2023-01-24 DIAGNOSIS — E1122 Type 2 diabetes mellitus with diabetic chronic kidney disease: Secondary | ICD-10-CM

## 2023-01-24 DIAGNOSIS — Z7984 Long term (current) use of oral hypoglycemic drugs: Secondary | ICD-10-CM

## 2023-01-24 DIAGNOSIS — E785 Hyperlipidemia, unspecified: Secondary | ICD-10-CM | POA: Diagnosis not present

## 2023-01-24 DIAGNOSIS — E118 Type 2 diabetes mellitus with unspecified complications: Secondary | ICD-10-CM | POA: Diagnosis not present

## 2023-01-24 DIAGNOSIS — D892 Hypergammaglobulinemia, unspecified: Secondary | ICD-10-CM | POA: Diagnosis not present

## 2023-01-24 DIAGNOSIS — N1831 Chronic kidney disease, stage 3a: Secondary | ICD-10-CM

## 2023-01-24 DIAGNOSIS — I10 Essential (primary) hypertension: Secondary | ICD-10-CM

## 2023-01-24 LAB — CBC WITH DIFFERENTIAL/PLATELET
Basophils Absolute: 0 10*3/uL (ref 0.0–0.1)
Basophils Relative: 0.3 % (ref 0.0–3.0)
Eosinophils Absolute: 0.2 10*3/uL (ref 0.0–0.7)
Eosinophils Relative: 4.9 % (ref 0.0–5.0)
HCT: 41.9 % (ref 36.0–46.0)
Hemoglobin: 13.4 g/dL (ref 12.0–15.0)
Lymphocytes Relative: 43.9 % (ref 12.0–46.0)
Lymphs Abs: 2.1 10*3/uL (ref 0.7–4.0)
MCHC: 31.9 g/dL (ref 30.0–36.0)
MCV: 89.1 fL (ref 78.0–100.0)
Monocytes Absolute: 0.4 10*3/uL (ref 0.1–1.0)
Monocytes Relative: 8.7 % (ref 3.0–12.0)
Neutro Abs: 2 10*3/uL (ref 1.4–7.7)
Neutrophils Relative %: 42.2 % — ABNORMAL LOW (ref 43.0–77.0)
Platelets: 223 10*3/uL (ref 150.0–400.0)
RBC: 4.7 Mil/uL (ref 3.87–5.11)
RDW: 15.4 % (ref 11.5–15.5)
WBC: 4.8 10*3/uL (ref 4.0–10.5)

## 2023-01-24 LAB — HEPATIC FUNCTION PANEL
ALT: 22 U/L (ref 0–35)
AST: 31 U/L (ref 0–37)
Albumin: 4 g/dL (ref 3.5–5.2)
Alkaline Phosphatase: 82 U/L (ref 39–117)
Bilirubin, Direct: 0.1 mg/dL (ref 0.0–0.3)
Total Bilirubin: 0.5 mg/dL (ref 0.2–1.2)
Total Protein: 8.3 g/dL (ref 6.0–8.3)

## 2023-01-24 LAB — MICROALBUMIN / CREATININE URINE RATIO
Creatinine,U: 98.9 mg/dL
Microalb Creat Ratio: 0.7 mg/g (ref 0.0–30.0)
Microalb, Ur: <0.7 mg/dL (ref 0.0–1.9)

## 2023-01-24 LAB — BASIC METABOLIC PANEL
BUN: 20 mg/dL (ref 6–23)
CO2: 28 meq/L (ref 19–32)
Calcium: 10.2 mg/dL (ref 8.4–10.5)
Chloride: 103 meq/L (ref 96–112)
Creatinine, Ser: 1.04 mg/dL (ref 0.40–1.20)
GFR: 56.55 mL/min — ABNORMAL LOW (ref >60.00)
Glucose, Bld: 87 mg/dL (ref 70–99)
Potassium: 4.8 meq/L (ref 3.5–5.1)
Sodium: 138 meq/L (ref 135–145)

## 2023-01-24 LAB — HEMOGLOBIN A1C: Hgb A1c MFr Bld: 6 % (ref 4.6–6.5)

## 2023-01-24 LAB — CK: Total CK: 94 U/L (ref 7–177)

## 2023-01-24 MED ORDER — FREESTYLE LIBRE 3 SENSOR MISC: 1.0000 | Freq: Every day | 5 refills | Status: DC

## 2023-01-24 MED ORDER — KERENDIA 20 MG PO TABS: 1.0000 | ORAL_TABLET | Freq: Every day | ORAL | 0 refills | Status: DC

## 2023-01-24 MED ORDER — DAPAGLIFLOZIN PROPANEDIOL 10 MG PO TABS: 10.0000 mg | ORAL_TABLET | Freq: Every day | ORAL | 1 refills | Status: DC

## 2023-01-24 NOTE — Telephone Encounter (Signed)
 Pharmacy Patient Advocate Encounter   Received notification from CoverMyMeds that prior authorization for Mounjaro  7.5 mg  is required/requested.   Insurance verification completed.   The patient is insured through [employer] .   Per test claim: PA required; PA submitted to above mentioned insurance via CoverMyMeds Key/confirmation #/EOC [id number] Status is pending

## 2023-01-24 NOTE — Progress Notes (Signed)
 Subjective:  Patient ID: Angel Gordon, female    DOB: 1955-08-01  Age: 68 y.o. MRN: [id number]  CC: Hyperlipidemia and Diabetes   HPI Angel Gordon presents for f/up ---  Discussed the use of AI scribe software for clinical note transcription with the patient, who gave verbal consent to proceed.  History of Present Illness   The patient, with a history of diabetes, presents with concerns about her medication regimen, specifically questioning the need for statin therapy despite not having high cholesterol. She also mentions not taking prescribed potassium supplements due to dietary changes that have increased her potassium levels.  The patient has been experiencing constipation, for which she has been self-medicating with mrralax. She denies any nausea, vomiting, dizziness, lightheadedness, coughing, wheezing, shortness of breath, fever, chills, or sore throat.  She is due for a follow-up visit with the urologist, who has also ordered additional tests related to her kidneys. She has had recent CT scans of her kidneys, lungs, and heart, and is also under the care of a pulmonologist, with a follow-up visit scheduled in February.  The patient has been receiving vaccinations, including the flu and COVID-19 vaccines, [facility]. She expresses dissatisfaction with the adhesive used in a certain brand of glucose monitoring patches, which she finds painful to remove, and is considering switching pharmacies for this reason. She also mentions that she is due for a refill of her diabetes medication, Mounjaro .        Outpatient Medications Prior to Visit  Medication Sig Dispense Refill   Aspirin 81 MG CAPS Take 1 capsule every day by oral route.     atorvastatin  (LIPITOR) 10 MG tablet TAKE 1 TABLET DAILY 90 tablet 1   Plecanatide  (TRULANCE ) 3 MG TABS Take 1 tablet (3 mg total) by mouth daily. 90 tablet 0   Continuous Glucose Receiver (DEXCOM G7 RECEIVER) DEVI 1 Act by Does not apply  route daily. 9 each 1   Continuous Glucose Sensor (DEXCOM G7 SENSOR) MISC 1 Act by Does not apply route daily. 9 each 1   FARXIGA  10 MG TABS tablet TAKE 1 TABLET DAILY 90 tablet 0   KERENDIA  20 MG TABS TAKE 1 TABLET BY MOUTH EVERY DAY 90 tablet 0   tirzepatide  (MOUNJARO ) 7.5 MG/0.5ML Pen INJECT 7.5 MG SUBCUTANEOUSLY WEEKLY 6 mL 0   fluticasone  (FLONASE ) 50 MCG/ACT nasal spray Place 2 sprays into both nostrils daily. 48 g 1   Continuous Glucose Sensor (FREESTYLE LIBRE 3 SENSOR) MISC PLACE 1 SENSOR ON THE SKIN EVERY 14 DAYS. USE TO CHECK GLUCOSE CONTINUOUSLY (Patient not taking: Reported on 01/24/2023) 2 each 5   dexlansoprazole  (DEXILANT ) 60 MG capsule Take 1 tablet by mouth daily. (Patient not taking: Reported on 01/24/2023)     diclofenac  (VOLTAREN ) 75 MG EC tablet  (Patient not taking: Reported on 01/24/2023)     gabapentin (NEURONTIN) 300 MG capsule Take 300 mg by mouth as needed. (Patient not taking: Reported on 01/24/2023)     potassium chloride  (KLOR-CON  10) 10 MEQ tablet Take 1 tablet (10 mEq total) by mouth 2 (two) times daily. (Patient not taking: Reported on 01/24/2023) 180 tablet 1   No facility-administered medications prior to visit.    ROS Review of Systems  Constitutional: Negative.  Negative for appetite change, diaphoresis, fatigue and unexpected weight change.  HENT: Negative.  Negative for sore throat.   Eyes: Negative.  Negative for visual disturbance.  Respiratory: Negative.  Negative for cough, chest tightness, shortness of breath  and wheezing.   Cardiovascular:  Negative for chest pain, palpitations and leg swelling.  Gastrointestinal:  Positive for constipation. Negative for abdominal pain, blood in stool, diarrhea, nausea and vomiting.  Genitourinary: Negative.   Musculoskeletal: Negative.  Negative for arthralgias.  Skin: Negative.  Negative for color change and pallor.  Neurological:  Negative for dizziness and weakness.  Hematological:  Negative for adenopathy. Does not  bruise/bleed easily.  Psychiatric/Behavioral: Negative.      Objective:  BP 122/68 (BP Location: Right Arm, Patient Position: Sitting, Cuff Size: Normal)   Pulse 77   Temp 97.8 F (36.6 C) (Oral)   Resp 16   Ht 4' 11 (1.499 m)   Wt 137 lb (62.1 kg)   LMP 02/23/2011 Comment: pt states she has had recent return of periods after going through menopause in her early 80s, instruct pt to call her Gyn to report this, pt verb understanding   SpO2 98%   BMI 27.67 kg/m   BP Readings from Last 3 Encounters:  01/24/23 122/68  12/10/22 102/78  08/20/22 118/76    Wt Readings from Last 3 Encounters:  01/24/23 137 lb (62.1 kg)  12/10/22 141 lb 6.4 oz (64.1 kg)  08/20/22 154 lb (69.9 kg)    Physical Exam Vitals reviewed.  Constitutional:      Appearance: Normal appearance.  HENT:     Nose: Nose normal.     Mouth/Throat:     Mouth: Mucous membranes are moist.  Eyes:     General: No scleral icterus.    Conjunctiva/sclera: Conjunctivae normal.  Cardiovascular:     Rate and Rhythm: Normal rate and regular rhythm.     Heart sounds: No murmur heard.    No friction rub. No gallop.  Pulmonary:     Effort: Pulmonary effort is normal.     Breath sounds: No stridor. No wheezing, rhonchi or rales.  Abdominal:     General: Abdomen is flat.     Palpations: There is no mass.     Tenderness: There is no abdominal tenderness. There is no guarding.     Hernia: No hernia is present.  Musculoskeletal:        General: Normal range of motion.     Cervical back: Neck supple.     Right lower leg: No edema.     Left lower leg: No edema.  Lymphadenopathy:     Cervical: No cervical adenopathy.  Skin:    General: Skin is warm and dry.     Coloration: Skin is not pale.     Findings: No rash.  Neurological:     General: No focal deficit present.     Mental Status: She is alert. Mental status is at baseline.  Psychiatric:        Mood and Affect: Mood normal.        Behavior: Behavior normal.      Lab Results  Component Value Date   WBC 4.8 01/24/2023   HGB 13.4 01/24/2023   HCT 41.9 01/24/2023   PLT 223.0 01/24/2023   GLUCOSE 87 01/24/2023   CHOL 91 08/20/2022   TRIG 43.0 08/20/2022   HDL 48.10 08/20/2022   LDLCALC 34 08/20/2022   ALT 22 01/24/2023   AST 31 01/24/2023   NA 138 01/24/2023   K 4.8 01/24/2023   CL 103 01/24/2023   CREATININE 1.04 01/24/2023   BUN 20 01/24/2023   CO2 28 01/24/2023   TSH 1.97 05/07/2022   HGBA1C 6.0 01/24/2023   MICROALBUR <  0.7 01/24/2023    CT Chest High Resolution Result Date: 11/26/2022 CLINICAL DATA:  Lung nodule. EXAM: CT CHEST WITHOUT CONTRAST TECHNIQUE: Multidetector CT imaging of the chest was performed following the standard protocol without intravenous contrast. High resolution imaging of the lungs, as well as inspiratory and expiratory imaging, was performed. RADIATION DOSE REDUCTION: This exam was performed according to the departmental dose-optimization program which includes automated exposure control, adjustment of the mA and/or kV according to patient size and/or use of iterative reconstruction technique. COMPARISON:  Cardiac CT 10/18/2022.  CT chest 09/29/2015. FINDINGS: Cardiovascular: Atherosclerotic calcification of the aorta. Heart is at the upper limits of normal in size. No pericardial effusion. Mediastinum/Nodes: No pathologically enlarged mediastinal or axillary lymph nodes. Hilar regions are difficult to definitively evaluate without IV contrast. Esophagus is grossly unremarkable. Lungs/Pleura: Mild traction bronchiectasis with coarsened interstitial and subpleural ground-glass as well as scattered subpleural reticular densities, largely new from 09/29/2015. There is nodularity along the fissures. 4 mm anterior segment left upper lobe nodule (8/53), stable from 10/18/2022 but new from 09/29/2015. No pleural fluid. Airway is otherwise unremarkable. Expiratory phase imaging was not performed in true expiration, limiting the  evaluation for air trapping. Upper Abdomen: Small hiatal hernia. Visualized portions of the liver, gallbladder, adrenal glands, kidneys, spleen, pancreas, stomach and bowel are otherwise grossly unremarkable. No upper abdominal adenopathy. Musculoskeletal: Degenerative changes in the spine. IMPRESSION: 1. Pulmonary parenchymal pattern of interstitial lung disease may be due to sarcoid or fibrotic hypersensitivity pneumonitis. Findings are suggestive of an alternative diagnosis (not UIP) per consensus guidelines: Diagnosis of Idiopathic Pulmonary Fibrosis: An Official ATS/ERS/JRS/ALAT Clinical Practice Guideline. Am JINNY Honey Crit Care Med Vol 198, Iss 5, [phone], Sep 22 2016. 2. 4 mm left upper lobe nodule, stable from 10/18/2022 and likely related to #1. No follow-up needed if patient is low-risk.This recommendation follows the consensus statement: Guidelines for Management of Incidental Pulmonary Nodules Detected on CT Images: From the Fleischner Society 2017; Radiology 2017; 284:228-243. 3.  Aortic atherosclerosis (ICD10-I70.0). Electronically Signed   By: Newell Eke M.D.   On: 11/26/2022 08:42    Assessment & Plan:   Type 2 diabetes mellitus with complication, without long-term current use of insulin  (HCC)- Blood sugar is well controlled. -     Basic metabolic panel; Future -     Hemoglobin A1c; Future -     FreeStyle Libre 3 Sensor; Place 1 Act onto the skin daily.  Dispense: 2 each; Refill: 5 -     Microalbumin / creatinine urine ratio; Future -     Dapagliflozin  Propanediol; Take 1 tablet (10 mg total) by mouth daily.  Dispense: 90 tablet; Refill: 1 -     Kerendia ; Take 1 tablet (20 mg total) by mouth daily.  Dispense: 90 tablet; Refill: 0 -     Mounjaro ; Inject 7.5 mg into the skin once a week.  Dispense: 6 mL; Refill: 1 -     FreeStyle Libre 3 Reader; 1 Act by Does not apply route daily.  Dispense: 1 each; Refill: 3  Essential hypertension -     Basic metabolic panel; Future -     CBC  with Differential/Platelet; Future  Paraproteinemia- Will check an SPEP. -     CBC with Differential/Platelet; Future -     Hepatic function panel; Future -     Protein electrophoresis, serum; Future  Hyperlipidemia with target LDL less than 100 -     CK; Future  Stage 3a chronic kidney disease (HCC) -  Dapagliflozin  Propanediol; Take 1 tablet (10 mg total) by mouth daily.  Dispense: 90 tablet; Refill: 1  Chronic kidney disease, stage 3a (HCC) -     Kerendia ; Take 1 tablet (20 mg total) by mouth daily.  Dispense: 90 tablet; Refill: 0  Type 2 diabetes mellitus with stage 3a chronic kidney disease, without long-term current use of insulin  (HCC) -     Kerendia ; Take 1 tablet (20 mg total) by mouth daily.  Dispense: 90 tablet; Refill: 0     Follow-up: Return in about 6 months (around 07/24/2023).  Debby Molt, MD

## 2023-01-24 NOTE — Patient Instructions (Signed)

## 2023-01-25 DIAGNOSIS — N1831 Chronic kidney disease, stage 3a: Secondary | ICD-10-CM | POA: Insufficient documentation

## 2023-01-25 DIAGNOSIS — E118 Type 2 diabetes mellitus with unspecified complications: Secondary | ICD-10-CM | POA: Insufficient documentation

## 2023-01-25 MED ORDER — FREESTYLE LIBRE 3 SENSOR MISC: 1.0000 | Freq: Every day | 5 refills | Status: DC

## 2023-01-25 MED ORDER — MOUNJARO 7.5 MG/0.5ML ~~LOC~~ SOAJ: 7.5000 mg | SUBCUTANEOUS | 1 refills | Status: DC

## 2023-01-25 MED ORDER — FREESTYLE LIBRE 3 READER DEVI: 1.0000 | Freq: Every day | 3 refills | Status: AC

## 2023-01-25 NOTE — Telephone Encounter (Signed)
 Spoke with the patient via telephone and advised her of her medication being refilled and which pharmacy it's at. Also updated her pharmacy in her chart.

## 2023-01-26 LAB — PROTEIN ELECTROPHORESIS, SERUM
Albumin ELP: 3.4 g/dL — ABNORMAL LOW (ref 3.8–4.8)
Alpha 1: 0.3 g/dL (ref 0.2–0.3)
Alpha 2: 0.9 g/dL (ref 0.5–0.9)
Beta 2: 0.8 g/dL — ABNORMAL HIGH (ref 0.2–0.5)
Beta Globulin: 0.6 g/dL (ref 0.4–0.6)
Gamma Globulin: 1.7 g/dL (ref 0.8–1.7)
Total Protein: 7.7 g/dL (ref 6.1–8.1)

## 2023-01-29 ENCOUNTER — Other Ambulatory Visit: Payer: Self-pay

## 2023-01-29 ENCOUNTER — Other Ambulatory Visit (HOSPITAL_COMMUNITY): Payer: Self-pay

## 2023-01-29 DIAGNOSIS — E118 Type 2 diabetes mellitus with unspecified complications: Secondary | ICD-10-CM

## 2023-01-29 DIAGNOSIS — N1831 Chronic kidney disease, stage 3a: Secondary | ICD-10-CM

## 2023-01-29 MED ORDER — MOUNJARO 7.5 MG/0.5ML ~~LOC~~ SOAJ: 7.5000 mg | SUBCUTANEOUS | 1 refills | Status: DC

## 2023-01-29 MED ORDER — DAPAGLIFLOZIN PROPANEDIOL 10 MG PO TABS: 10.0000 mg | ORAL_TABLET | Freq: Every day | ORAL | 1 refills | Status: DC

## 2023-01-29 NOTE — Telephone Encounter (Signed)
 Pharmacy Patient Advocate Encounter  Received notification from [facility] that Prior Authorization for Mounjaro  7.5MG /0.5ML auto-injectors has been APPROVED from 12/25/22 to 01/24/24. Unable to obtain price due to refill too soon rejection, last fill date 01/28/23 next available fill date03/09/25   PA #/Case ID/Reference #: [id number]

## 2023-01-30 ENCOUNTER — Encounter: Payer: Self-pay | Admitting: Internal Medicine

## 2023-01-31 ENCOUNTER — Other Ambulatory Visit: Payer: Self-pay | Admitting: Internal Medicine

## 2023-01-31 DIAGNOSIS — E118 Type 2 diabetes mellitus with unspecified complications: Secondary | ICD-10-CM

## 2023-02-04 ENCOUNTER — Ambulatory Visit (HOSPITAL_BASED_OUTPATIENT_CLINIC_OR_DEPARTMENT_OTHER)
Admission: RE | Admit: 2023-02-04 | Discharge: 2023-02-04 | Disposition: A | Discharge status: Home / Self Care | Payer: 59 | Source: Ambulatory Visit | Attending: Family Medicine | Admitting: Family Medicine

## 2023-02-04 ENCOUNTER — Ambulatory Visit: Payer: 59 | Admitting: Family Medicine

## 2023-02-04 VITALS — BP 123/69 | HR 64 | Ht 59.0 in | Wt 137.0 lb

## 2023-02-04 DIAGNOSIS — R0781 Pleurodynia: Secondary | ICD-10-CM | POA: Insufficient documentation

## 2023-02-04 MED ORDER — CYCLOBENZAPRINE HCL 5 MG PO TABS: 5.0000 mg | ORAL_TABLET | Freq: Three times a day (TID) | ORAL | 0 refills | Status: DC | PRN

## 2023-02-04 NOTE — Progress Notes (Signed)
 Acute Office Visit  Subjective:     Patient ID: Angel Gordon, female    DOB: Feb 12, 1955, 68 y.o.   MRN: [id number]  Chief Complaint  Patient presents with   Pain    HPI Patient is in today for right sided rib pain.   Discussed the use of AI scribe software for clinical note transcription with the patient, who gave verbal consent to proceed.  History of Present Illness   The patient right-sided chest/rib pain that started a week ago after lifting a heavy box. The pain, described as severe and very uncomfortable, is located under the right breast and radiates to the right side laterally. The patient denies any visible bruising or swelling in the area. The pain was so severe initially that it restricted the patient's ability to turn or lift objects. The pain intensity has since decreased but remains present, particularly with lifting, twisting, or pressing on the area. The patient denies any associated symptoms such as fever, chills, or cough.  The patient has been managing the pain with Tylenol  and cold packs, and initially used heat pads before being advised to switch to cold. The patient also has a history of a right breast biopsy a few months ago which was negative. The patient denies any recent changes in the breast or any new symptoms related to the breast. The patient has not been taking any new medications and has been adhering to her current regimen for her chronic conditions.            ROS All review of systems negative except what is listed in the HPI      Objective:    BP 123/69   Pulse 64   Ht 4' 11 (1.499 m)   Wt 137 lb (62.1 kg)   LMP 02/23/2011   SpO2 100%   BMI 27.67 kg/m    Physical Exam Vitals reviewed.  Constitutional:      Appearance: Normal appearance.  Chest:     Comments: Right ribs, under breast, with mild generalized tenderness to palpation; no palpable breast abnormality or skin changes  Musculoskeletal:        General: Tenderness  present. No swelling. Normal range of motion.  Skin:    Findings: No bruising, erythema or rash.  Neurological:     Mental Status: She is alert and oriented to person, place, and time.  Psychiatric:        Mood and Affect: Mood normal.        Behavior: Behavior normal.        Thought Content: Thought content normal.        Judgment: Judgment normal.     No results found for any visits on 02/04/23.      Assessment & Plan:   Problem List Items Addressed This Visit   None Visit Diagnoses       Rib pain on right side    -  Primary   Relevant Medications   cyclobenzaprine  (FLEXERIL ) 5 MG tablet   Other Relevant Orders   DG Ribs Unilateral Right      Pain under the right breast and extending laterally, likely due to muscle strain from heavy lifting. No visible bruising or swelling. Pain exacerbated by lifting and pressing on the area. No respiratory symptoms. -Patient very worried about a rib fracture. Order right-sided rib x-ray to rule out rib fracture. -Prescribe low-dose muscle relaxer (Flexeril ) to alleviate muscle tension and pain. -Advise continued use of Tylenol  for pain management. -  Recommend application of ice and heat, rest, and gentle stretching. -Follow-up in a couple of weeks if no improvement.          Meds ordered this encounter  Medications   cyclobenzaprine  (FLEXERIL ) 5 MG tablet    Sig: Take 1 tablet (5 mg total) by mouth 3 (three) times daily as needed for muscle spasms.    Dispense:  30 tablet    Refill:  0    Supervising Provider:   DOMENICA BLACKBIRD A [[id number]]    Return if symptoms worsen or fail to improve.  Waddell KATHEE Mon, NP

## 2023-02-05 ENCOUNTER — Other Ambulatory Visit (HOSPITAL_COMMUNITY): Payer: Self-pay

## 2023-02-06 LAB — HM DIABETES EYE EXAM

## 2023-02-10 ENCOUNTER — Encounter: Payer: Self-pay | Admitting: Family Medicine

## 2023-02-21 ENCOUNTER — Other Ambulatory Visit: Payer: Self-pay | Admitting: Urology

## 2023-02-25 ENCOUNTER — Ambulatory Visit: Payer: 59 | Admitting: Pulmonary Disease

## 2023-02-25 ENCOUNTER — Encounter: Payer: Self-pay | Admitting: Pulmonary Disease

## 2023-02-25 VITALS — BP 107/70 | HR 63 | Ht 59.0 in | Wt 134.0 lb

## 2023-02-25 DIAGNOSIS — J849 Interstitial pulmonary disease, unspecified: Secondary | ICD-10-CM | POA: Diagnosis not present

## 2023-02-25 LAB — PULMONARY FUNCTION TEST
DL/VA % pred: 133 %
DL/VA: 5.81 ml/min/mmHg/L
DLCO cor % pred: 76 %
DLCO cor: 12.79 ml/min/mmHg
DLCO unc % pred: 76 %
DLCO unc: 12.79 ml/min/mmHg
FEF 25-75 Post: 2.81 L/s
FEF 25-75 Pre: 2.72 L/s
FEF2575-%Change-Post: 3 %
FEF2575-%Pred-Post: 151 %
FEF2575-%Pred-Pre: 147 %
FEV1-%Change-Post: 0 %
FEV1-%Pred-Post: 84 %
FEV1-%Pred-Pre: 84 %
FEV1-Post: 1.66 L
FEV1-Pre: 1.66 L
FEV1FVC-%Change-Post: 0 %
FEV1FVC-%Pred-Pre: 119 %
FEV6-%Change-Post: 0 %
FEV6-%Pred-Post: 73 %
FEV6-%Pred-Pre: 73 %
FEV6-Post: 1.81 L
FEV6-Pre: 1.81 L
FEV6FVC-%Pred-Post: 104 %
FEV6FVC-%Pred-Pre: 104 %
FVC-%Change-Post: 0 %
FVC-%Pred-Post: 70 %
FVC-%Pred-Pre: 70 %
FVC-Post: 1.82 L
FVC-Pre: 1.81 L
Post FEV1/FVC ratio: 91 %
Post FEV6/FVC ratio: 100 %
Pre FEV1/FVC ratio: 92 %
Pre FEV6/FVC Ratio: 100 %
RV % pred: 61 %
RV: 1.13 L
TLC % pred: 63 %
TLC: 2.72 L

## 2023-02-25 NOTE — Patient Instructions (Signed)
 Full PFT performed today.

## 2023-02-25 NOTE — Progress Notes (Signed)
Full PFT performed today.Patient had great difficulty with test today.

## 2023-02-25 NOTE — Progress Notes (Signed)
Angel Gordon    [id number]    06-12-1955  Primary Care Physician:Jones, Bernadene Bell, MD  Referring Physician: Etta Grandchild, MD [address] [place],  [place] [address]  Chief complaint: Consult for abnormal CT, dyspnea  HPI: 68 y.o. who  has a past medical history of Allergy, Post-menopausal bleeding (03/16/2019), and Type II or unspecified type diabetes mellitus without mention of complication, not stated as uncontrolled.   Discussed the use of AI scribe software for clinical note transcription with the patient, who gave verbal consent to proceed.  The patient, with a history of diabetes, fatty liver, and stage three chronic kidney disease, presents for a follow-up on recent CT scan results. She was initially evaluated for suspected heart issues, but the cardiac CT scan revealed lung changes instead. The patient reports no noticeable symptoms such as shortness of breath or cough. She maintains an active lifestyle, walking three times a day, and has not noticed any significant changes in her health. She has experienced some weight loss and decreased appetite, which she attributes to a medication change to [facility] shots. She also mentions a slight discomfort in her chest area. The patient has never smoked, has no known exposure to harmful substances, and has no family history of lung disease. She recently had a biopsy for a lump in her right breast, which was not cancerous.   Pets: No pets Occupation: Works in [employer] Exposures: No known exposures.  No mold,, Jacuzzi.  No feather pillows or comforters Smoking history: Never smoker Travel history: No significant travel history Relevant family history: No family history of lung disease  Interim History Discussed the use of AI scribe software for clinical note transcription with the patient, who gave verbal consent to proceed.  The patient has been followed for interstitial lung disease, characterized by  scarring or inflammation in the lungs, as observed on a CT scan. She reports no known exposures to mold, feather pillows, or pets. She experiences no breathing difficulties in daily life and does not use inhalers or other medications for lung issues.  A previous workup included blood tests showing a slight elevation in rheumatoid factor, which could indicate rheumatoid arthritis. However, she reports no joint pain or stiffness, except for occasional knee discomfort. She maintains an active lifestyle, walking daily and achieving 13,000 steps without fatigue.  During the lung function test follow-up, she found the equipment challenging due to its size and her small mouth. Despite this, she reports no current symptoms or breathing issues since her last visit.  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  Outpatient Encounter Medications as of 02/25/2023  Medication Sig   Aspirin 81 MG CAPS Take 1 capsule every day by oral route.   atorvastatin (LIPITOR) 10 MG tablet TAKE 1 TABLET DAILY   Continuous Glucose Receiver (FREESTYLE LIBRE 3 READER) DEVI 1 Act by Does not apply route daily.   Continuous Glucose Sensor (FREESTYLE LIBRE 14 DAY SENSOR) MISC USE AS DIRECTED   cyclobenzaprine (FLEXERIL) 5 MG tablet Take 1 tablet (5 mg total) by mouth 3 (three) times daily as needed for muscle spasms.   dapagliflozin propanediol (FARXIGA) 10 MG TABS tablet Take 1 tablet (10 mg total) by mouth daily.   Finerenone (KERENDIA) 20 MG TABS Take 1 tablet (20 mg total) by mouth daily.   fluticasone (FLONASE) 50 MCG/ACT nasal spray Place 2 sprays into both nostrils daily.   Plecanatide (  TRULANCE) 3 MG TABS Take 1 tablet (3 mg total) by mouth daily.   tirzepatide (MOUNJARO) 7.5 MG/0.5ML Pen Inject 7.5 mg into the skin once a week.   No facility-administered encounter medications on file as of 02/25/2023.   Physical Exam: Blood pressure 102/78, pulse 70, temperature 97.8 F (36.6 C), temperature  source Oral, height 4\' 11"  (1.499 m), weight 141 lb 6.4 oz (64.1 kg), last menstrual period 02/23/2011, SpO2 98%. Gen:      No acute distress HEENT:  EOMI, sclera anicteric Neck:     No masses; no thyromegaly Lungs:    Clear to auscultation bilaterally; normal respiratory effort CV:         Regular rate and rhythm; no murmurs Abd:      + bowel sounds; soft, non-tender; no palpable masses, no distension Ext:    No edema; adequate peripheral perfusion Skin:      Warm and dry; no rash Neuro: alert and oriented x 3 Psych: normal mood and affect  Data Reviewed: Imaging: Cardiac CT 10/24/2022-patchy areas of groundglass attenuation, septal thickening with mild cylindrical bronchiectasis, subcentimeter pulmonary nodules.  High resolution CT 11/26/2022-mild traction bronchiectasis with subpleural groundglass, reticulation with nodularity along the fissures, subcentimeter pulmonary nodules.  I reviewed the images personally.  PFTs: 02/22/2023 FVC 2.38 [54%], FEV1 2.00 [64%], F/F84, TLC 4.33 [58%], DLCO 12.75 [49%] Mild restriction, elevated diffusion capacity for measured alveolar volume  Labs: CTD serologies 12/12/2022-ANA 1: 40, nuclear, rheumatoid factor 36, negative angiotensin-converting enzyme, hypersensitivity pneumonitis  Assessment and Plan Interstitial Lung Disease Mild interstitial lung disease with mild reduction in lung capacity noted on CT scan. Differential includes exposure-related causes, autoimmune diseases, or idiopathic origins. No respiratory symptoms, joint pains, or exposures reported. Rheumatoid factor slightly elevated but no clinical symptoms of rheumatoid arthritis. Discussed options: aggressive investigation with lung biopsy or conservative management with monitoring. Patient prefers conservative management due to past painful experience with breast biopsy. Risks of waiting include potential progression and irreversible lung function loss discussed in detail with  patient.  - Repeat CT scan in October or November 2025.  She prefers not to repeat the PFTs due to difficulty with the test. - Monitor for any new symptoms or changes in condition - Report any new symptoms immediately  General Health Maintenance Active, walking 13,000 steps daily without fatigue or dyspnea. No current medications or inhalers. - Continue current level of physical activity  Follow-up - Schedule follow-up appointment for October or November 2025.   Recommendations: Follow-up high-res CT  Chilton Greathouse MD [facility] 02/25/2023, 2:13 PM  CC: Etta Grandchild, MD

## 2023-02-25 NOTE — Patient Instructions (Signed)
VISIT SUMMARY:  You came in today for a follow-up on your interstitial lung disease. We discussed your current condition, reviewed your recent tests, and talked about your options moving forward. You are currently not experiencing any breathing difficulties or other symptoms, and you maintain an active lifestyle.  YOUR PLAN:  -INTERSTITIAL LUNG DISEASE: Interstitial lung disease involves scarring or inflammation in the lungs, which can affect your breathing. Your condition is mild, and you have no respiratory symptoms at this time. We discussed two options: aggressive investigation with a lung biopsy or conservative management with monitoring. You chose conservative management. We will repeat your CT scan in October or November 2025 and monitor for any new symptoms. Please report any new symptoms immediately.  -GENERAL HEALTH MAINTENANCE: You are maintaining good general health by walking 13,000 steps daily without any fatigue or breathing issues. Continue with your current level of physical activity.  INSTRUCTIONS:  Please schedule a follow-up appointment for October or November 2025. In the meantime, monitor for any new symptoms and report them immediately.

## 2023-02-26 NOTE — Progress Notes (Addendum)
 Anesthesia Review:  PCP: Crayton Docker  LOV 01/24/23  Cardiologist : none  Pulmonology- DR Mannam- LOV 02/25/23  Chest x-ray : CT Chest- 11/26/22  EKG : 05/07/22  Echo : 05/31/22   PFT- 02/25/23  CT Card- 10/24/22  Stress test: Cardiac Cath :  Activity level: can do a flight of stiars without difficutly  Sleep Study/ CPAP : none  Fasting Blood Sugar :      / Checks Blood Sugar -- times a day:   Blood Thinner/ Instructions /Last Dose: ASA / Instructions/ Last Dose :  Freestyle Libre   DM- type2 Hgba1c-01/24/23-6.0  Farxiga - Last dose on 03/16/23  Mounjarso- last dose on 03/06/23    Glucose was 68 at preop appt.  PT states she did not eat this am.   PT asymptomatic.  PT given Shasta Cola and graham crackers.  She does not like peanut butter.   AFter eating crackers and drinking Shasta Cola on 15 minute recheck glusoe was 96.  PT as going to [facility] per pt and obtain a biscuit.     Temp was not taken due to pt drinking cold water at preop appt.

## 2023-02-27 NOTE — Patient Instructions (Addendum)
 SURGICAL WAITING ROOM VISITATION  Patients having surgery or a procedure may have no more than 2 support people in the waiting area - these visitors may rotate.    Children under the age of 50 must have an adult with them who is not the patient.  Due to an increase in RSV and influenza rates and associated hospitalizations, children ages 67 and under may not visit patients in [facility] hospitals.  Visitors with respiratory illnesses are discouraged from visiting and should remain at home.  If the patient needs to stay at the hospital during part of their recovery, the visitor guidelines for inpatient rooms apply. Pre-op nurse will coordinate an appropriate time for 1 support person to accompany patient in pre-op.  This support person may not rotate.    Please refer to the [facility] website for the visitor guidelines for Inpatients (after your surgery is over and you are in a regular room).       Your procedure is scheduled on:  03/20/2023    Report to [facility] Main Entrance    Report to admitting at   [phone]   Call this number if you have problems the morning of surgery [phone]   Do not eat food  or drink liquids :After Midnight.                 If you have questions, please contact your surgeon's office.       Oral Hygiene is also important to reduce your risk of infection.                                    Remember - BRUSH YOUR TEETH THE MORNING OF SURGERY WITH YOUR REGULAR TOOTHPASTE  DENTURES WILL BE REMOVED PRIOR TO SURGERY PLEASE DO NOT APPLY "Poly grip" OR ADHESIVES!!!   Do NOT smoke after Midnight   Stop all vitamins and herbal supplements 7 days before surgery.   Take these medicines the morning of surgery with A SIP OF WATER:  none              Farxiga - hold for 72 hours prior to procedure - last dose on e            Mounjaro - last dose on   DO NOT TAKE ANY ORAL DIABETIC MEDICATIONS DAY OF YOUR SURGERY  Bring CPAP mask and tubing day  of surgery.                              You may not have any metal on your body including hair pins, jewelry, and body piercing             Do not wear make-up, lotions, powders, perfumes/cologne, or deodorant  Do not wear nail polish including gel and S&S, artificial/acrylic nails, or any other type of covering on natural nails including finger and toenails. If you have artificial nails, gel coating, etc. that needs to be removed by a nail salon please have this removed prior to surgery or surgery may need to be canceled/ delayed if the surgeon/ anesthesia feels like they are unable to be safely monitored.   Do not shave  48 hours prior to surgery.               Men may shave face and neck.   Do not bring valuables to the hospital.  [facility] IS NOT             RESPONSIBLE   FOR VALUABLES.   Contacts, glasses, dentures or bridgework may not be worn into surgery.   Bring small overnight bag day of surgery.   DO NOT BRING YOUR HOME MEDICATIONS TO THE HOSPITAL. PHARMACY WILL DISPENSE MEDICATIONS LISTED ON YOUR MEDICATION LIST TO YOU DURING YOUR ADMISSION IN THE HOSPITAL!    Patients discharged on the day of surgery will not be allowed to drive home.  Someone NEEDS to stay with you for the first 24 hours after anesthesia.   Special Instructions: Bring a copy of your healthcare power of attorney and living will documents the day of surgery if you haven't scanned them before.              Please read over the following fact sheets you were given: IF YOU HAVE QUESTIONS ABOUT YOUR PRE-OP INSTRUCTIONS PLEASE CALL [phone]   If you received a COVID test during your pre-op visit  it is requested that you wear a mask when out in public, stay away from anyone that may not be feeling well and notify your surgeon if you develop symptoms. If you test positive for Covid or have been in contact with anyone that has tested positive in the last 10 days please notify you surgeon.    [facility] -  Preparing for Surgery Before surgery, you can play an important role.  Because skin is not sterile, your skin needs to be as free of germs as possible.  You can reduce the number of germs on your skin by washing with CHG (chlorahexidine gluconate) soap before surgery.  CHG is an antiseptic cleaner which kills germs and bonds with the skin to continue killing germs even after washing. Please DO NOT use if you have an allergy to CHG or antibacterial soaps.  If your skin becomes reddened/irritated stop using the CHG and inform your nurse when you arrive at Short Stay. Do not shave (including legs and underarms) for at least 48 hours prior to the first CHG shower.  You may shave your face/neck. Please follow these instructions carefully:  1.  Shower with CHG Soap the night before surgery and the  morning of Surgery.  2.  If you choose to wash your hair, wash your hair first as usual with your  normal  shampoo.  3.  After you shampoo, rinse your hair and body thoroughly to remove the  shampoo.                           4.  Use CHG as you would any other liquid soap.  You can apply chg directly  to the skin and wash                       Gently with a scrungie or clean washcloth.  5.  Apply the CHG Soap to your body ONLY FROM THE NECK DOWN.   Do not use on face/ open                           Wound or open sores. Avoid contact with eyes, ears mouth and genitals (private parts).                       Wash face,  Genitals (private parts) with your normal soap.  6.  Wash thoroughly, paying special attention to the area where your surgery  will be performed.  7.  Thoroughly rinse your body with warm water from the neck down.  8.  DO NOT shower/wash with your normal soap after using and rinsing off  the CHG Soap.                9.  Pat yourself dry with a clean towel.            10.  Wear clean pajamas.            11.  Place clean sheets on your bed the night of your first shower and do not  sleep  with pets. Day of Surgery : Do not apply any lotions/deodorants the morning of surgery.  Please wear clean clothes to the hospital/surgery center.  FAILURE TO FOLLOW THESE INSTRUCTIONS MAY RESULT IN THE CANCELLATION OF YOUR SURGERY PATIENT SIGNATURE_________________________________  NURSE SIGNATURE__________________________________  ________________________________________________________________________

## 2023-03-04 ENCOUNTER — Other Ambulatory Visit: Payer: Self-pay

## 2023-03-04 ENCOUNTER — Encounter (HOSPITAL_COMMUNITY): Payer: Self-pay

## 2023-03-04 ENCOUNTER — Encounter (HOSPITAL_COMMUNITY)
Admission: RE | Admit: 2023-03-04 | Discharge: 2023-03-04 | Disposition: A | Discharge status: Home / Self Care | Payer: 59 | Source: Ambulatory Visit | Attending: Urology | Admitting: Urology

## 2023-03-04 VITALS — BP 107/76 | HR 77 | Resp 16 | Ht 59.0 in

## 2023-03-04 DIAGNOSIS — Z01818 Encounter for other preprocedural examination: Secondary | ICD-10-CM

## 2023-03-04 DIAGNOSIS — Z01812 Encounter for preprocedural laboratory examination: Secondary | ICD-10-CM | POA: Insufficient documentation

## 2023-03-04 HISTORY — DX: Interstitial pulmonary disease, unspecified: J84.9

## 2023-03-04 HISTORY — DX: Personal history of urinary calculi: Z87.442

## 2023-03-04 HISTORY — DX: Chronic kidney disease, unspecified: N18.9

## 2023-03-04 LAB — CBC
HCT: 44.1 % (ref 36.0–46.0)
Hemoglobin: 13.7 g/dL (ref 12.0–15.0)
MCH: 28.2 pg (ref 26.0–34.0)
MCHC: 31.1 g/dL (ref 30.0–36.0)
MCV: 90.9 fL (ref 80.0–100.0)
Platelets: 216 10*3/uL (ref 150–400)
RBC: 4.85 MIL/uL (ref 3.87–5.11)
RDW: 14.3 % (ref 11.5–15.5)
WBC: 5.6 10*3/uL (ref 4.0–10.5)
nRBC: 0 % (ref 0.0–0.2)

## 2023-03-04 LAB — BASIC METABOLIC PANEL
Anion gap: 9 (ref 5–15)
BUN: 17 mg/dL (ref 8–23)
CO2: 24 mmol/L (ref 22–32)
Calcium: 9.8 mg/dL (ref 8.9–10.3)
Chloride: 101 mmol/L (ref 98–111)
Creatinine, Ser: 0.91 mg/dL (ref 0.44–1.00)
GFR, Estimated: >60 mL/min (ref >60)
Glucose, Bld: 96 mg/dL (ref 70–99)
Potassium: 4.3 mmol/L (ref 3.5–5.1)
Sodium: 134 mmol/L — ABNORMAL LOW (ref 135–145)

## 2023-03-04 LAB — GLUCOSE, CAPILLARY
Glucose-Capillary: 68 mg/dL — ABNORMAL LOW (ref 70–99)
Glucose-Capillary: 96 mg/dL (ref 70–99)

## 2023-03-15 ENCOUNTER — Ambulatory Visit: Payer: 59 | Admitting: Podiatry

## 2023-03-15 ENCOUNTER — Encounter: Payer: Self-pay | Admitting: Podiatry

## 2023-03-15 DIAGNOSIS — M79675 Pain in left toe(s): Secondary | ICD-10-CM

## 2023-03-15 DIAGNOSIS — E139 Other specified diabetes mellitus without complications: Secondary | ICD-10-CM

## 2023-03-15 DIAGNOSIS — M79674 Pain in right toe(s): Secondary | ICD-10-CM | POA: Diagnosis not present

## 2023-03-15 DIAGNOSIS — B351 Tinea unguium: Secondary | ICD-10-CM | POA: Diagnosis not present

## 2023-03-16 NOTE — Progress Notes (Signed)
 Subjective:   Patient ID: Angel Gordon, female   DOB: 68 y.o.   MRN: [id number]   HPI Patient presents stating her diabetes been under good control but she is here for check and also has elongated nailbeds which can bother her and are hard for her to cut and she would like [place] to take care of   ROS      Objective:  Physical Exam  Neurovascular status intact with the patient appearing to be stable with all nailbeds with thickness yellow brittle that she is scared to cut with her diabetes and they do become tender     Assessment:  Patient who is a diabetic with mycotic nail infections and pain 1-5 both feet     Plan:  H&P reviewed and debrided nailbeds 1-5 both feet no iatrogenic bleeding and advised on self-care or pedicures which I think are safe for this patient given her excellent control

## 2023-03-19 NOTE — Anesthesia Preprocedure Evaluation (Signed)
 Anesthesia Evaluation  Patient identified by MRN, date of birth, ID band Patient awake    Reviewed: Allergy & Precautions, H&P , NPO status , Patient's Chart, lab work & pertinent test results  Airway Mallampati: III  TM Distance: >3 FB Neck ROM: Full    Dental no notable dental hx. (+) Teeth Intact   Pulmonary neg pulmonary ROS   Pulmonary exam normal breath sounds clear to auscultation       Cardiovascular hypertension, negative cardio ROS Normal cardiovascular exam Rhythm:Regular Rate:Normal  ECHO 5/25   1. Left ventricular ejection fraction, by estimation, is 60 to 65%. The  left ventricle has normal function. Left ventricular diastolic parameters  were normal.   2. Right ventricular systolic function is normal. The right ventricular  size is normal.   3. The mitral valve is normal in structure. Trivial mitral valve  regurgitation.   4. The aortic valve is tricuspid. Aortic valve regurgitation is not  visualized.   5. The inferior vena cava is normal in size with greater than 50%  respiratory variability, suggesting right atrial pressure of 3 mmHg.     Neuro/Psych negative neurological ROS  negative psych ROS   GI/Hepatic negative GI ROS, Neg liver ROS,,,  Endo/Other  diabetes, Well Controlled, Type 2, Oral Hypoglycemic Agents  Obesity  Renal/GU Renal diseasenegative Renal ROS  negative genitourinary   Musculoskeletal negative musculoskeletal ROS (+)    Abdominal   Peds  Hematology negative hematology ROS (+)   Anesthesia Other Findings   Reproductive/Obstetrics Endometrial Polyp PMB Endometrial Hyperplasia                             Anesthesia Physical Anesthesia Plan  ASA: 3  Anesthesia Plan: General   Post-op Pain Management: Minimal or no pain anticipated, Tylenol PO (pre-op)* and Celebrex PO (pre-op)*   Induction: Intravenous  PONV Risk Score and Plan: 3 and  Ondansetron, Dexamethasone and Treatment may vary due to age or medical condition  Airway Management Planned: LMA  Additional Equipment: None  Intra-op Plan:   Post-operative Plan: Extubation in OR  Informed Consent: I have reviewed the patients History and Physical, chart, labs and discussed the procedure including the risks, benefits and alternatives for the proposed anesthesia with the patient or authorized representative who has indicated his/her understanding and acceptance.     Dental advisory given  Plan Discussed with: CRNA, Anesthesiologist and Surgeon  Anesthesia Plan Comments: ( )        Anesthesia Quick Evaluation

## 2023-03-20 ENCOUNTER — Ambulatory Visit (HOSPITAL_COMMUNITY)
Admission: RE | Admit: 2023-03-20 | Discharge: 2023-03-20 | Disposition: A | Discharge status: Home / Self Care | Payer: 59 | Source: Ambulatory Visit | Attending: Urology | Admitting: Urology

## 2023-03-20 ENCOUNTER — Other Ambulatory Visit: Payer: Self-pay

## 2023-03-20 ENCOUNTER — Ambulatory Visit (HOSPITAL_COMMUNITY): Payer: 59 | Admitting: Physician Assistant

## 2023-03-20 ENCOUNTER — Encounter (HOSPITAL_COMMUNITY)
Admission: RE | Disposition: A | Discharge status: Home / Self Care | Payer: Self-pay | Source: Ambulatory Visit | Attending: Urology

## 2023-03-20 ENCOUNTER — Encounter (HOSPITAL_COMMUNITY): Payer: Self-pay | Admitting: Urology

## 2023-03-20 ENCOUNTER — Ambulatory Visit (HOSPITAL_COMMUNITY): Payer: 59

## 2023-03-20 ENCOUNTER — Ambulatory Visit (HOSPITAL_BASED_OUTPATIENT_CLINIC_OR_DEPARTMENT_OTHER): Payer: 59 | Admitting: Physician Assistant

## 2023-03-20 DIAGNOSIS — I129 Hypertensive chronic kidney disease with stage 1 through stage 4 chronic kidney disease, or unspecified chronic kidney disease: Secondary | ICD-10-CM | POA: Diagnosis not present

## 2023-03-20 DIAGNOSIS — E669 Obesity, unspecified: Secondary | ICD-10-CM | POA: Diagnosis not present

## 2023-03-20 DIAGNOSIS — N3592 Unspecified urethral stricture, female: Secondary | ICD-10-CM

## 2023-03-20 DIAGNOSIS — Z7984 Long term (current) use of oral hypoglycemic drugs: Secondary | ICD-10-CM | POA: Diagnosis not present

## 2023-03-20 DIAGNOSIS — N183 Chronic kidney disease, stage 3 unspecified: Secondary | ICD-10-CM | POA: Diagnosis not present

## 2023-03-20 DIAGNOSIS — N200 Calculus of kidney: Secondary | ICD-10-CM | POA: Insufficient documentation

## 2023-03-20 DIAGNOSIS — Z6826 Body mass index (BMI) 26.0-26.9, adult: Secondary | ICD-10-CM | POA: Insufficient documentation

## 2023-03-20 DIAGNOSIS — N1831 Chronic kidney disease, stage 3a: Secondary | ICD-10-CM

## 2023-03-20 DIAGNOSIS — E1122 Type 2 diabetes mellitus with diabetic chronic kidney disease: Secondary | ICD-10-CM | POA: Diagnosis not present

## 2023-03-20 DIAGNOSIS — Z7985 Long-term (current) use of injectable non-insulin antidiabetic drugs: Secondary | ICD-10-CM | POA: Diagnosis not present

## 2023-03-20 DIAGNOSIS — Z01818 Encounter for other preprocedural examination: Secondary | ICD-10-CM

## 2023-03-20 HISTORY — PX: CYSTOSCOPY/URETEROSCOPY/HOLMIUM LASER/STENT PLACEMENT: SHX6546

## 2023-03-20 LAB — GLUCOSE, CAPILLARY
Glucose-Capillary: 86 mg/dL (ref 70–99)
Glucose-Capillary: 89 mg/dL (ref 70–99)

## 2023-03-20 SURGERY — CYSTOSCOPY/URETEROSCOPY/HOLMIUM LASER/STENT PLACEMENT
Anesthesia: General | Site: Ureter | Laterality: Right

## 2023-03-20 MED ORDER — OXYCODONE-ACETAMINOPHEN 5-325 MG PO TABS: 1.0000 | ORAL_TABLET | ORAL | 0 refills | Status: DC | PRN

## 2023-03-20 MED ORDER — ORAL CARE MOUTH RINSE: 15.0000 mL | Freq: Once | OROMUCOSAL | Status: AC

## 2023-03-20 MED ORDER — FENTANYL CITRATE PF 50 MCG/ML IJ SOSY: 25.0000 ug | PREFILLED_SYRINGE | INTRAMUSCULAR | Status: DC | PRN

## 2023-03-20 MED ORDER — IOHEXOL 300 MG/ML  SOLN
INTRAMUSCULAR | Status: DC | PRN
  Administered 2023-03-20: 6 mL

## 2023-03-20 MED ORDER — CELECOXIB 200 MG PO CAPS
200.0000 mg | ORAL_CAPSULE | Freq: Once | ORAL | Status: DC
  Filled 2023-03-20: qty 1

## 2023-03-20 MED ORDER — MEPERIDINE HCL 50 MG/ML IJ SOLN: 6.2500 mg | INTRAMUSCULAR | Status: DC | PRN

## 2023-03-20 MED ORDER — PROPOFOL 10 MG/ML IV BOLUS
INTRAVENOUS | Status: AC
  Filled 2023-03-20: qty 20

## 2023-03-20 MED ORDER — PROPOFOL 10 MG/ML IV BOLUS
INTRAVENOUS | Status: DC | PRN
  Administered 2023-03-20: 120 mg via INTRAVENOUS

## 2023-03-20 MED ORDER — ONDANSETRON HCL 4 MG/2ML IJ SOLN: 4.0000 mg | Freq: Once | INTRAMUSCULAR | Status: DC | PRN

## 2023-03-20 MED ORDER — OXYBUTYNIN CHLORIDE 5 MG PO TABS: 5.0000 mg | ORAL_TABLET | Freq: Three times a day (TID) | ORAL | 1 refills | Status: DC | PRN

## 2023-03-20 MED ORDER — OXYCODONE HCL 5 MG/5ML PO SOLN: 5.0000 mg | Freq: Once | ORAL | Status: AC | PRN

## 2023-03-20 MED ORDER — LIDOCAINE HCL (PF) 2 % IJ SOLN
INTRAMUSCULAR | Status: AC
  Filled 2023-03-20: qty 5

## 2023-03-20 MED ORDER — ACETAMINOPHEN 160 MG/5ML PO SOLN: 325.0000 mg | ORAL | Status: DC | PRN

## 2023-03-20 MED ORDER — MIDAZOLAM HCL 2 MG/2ML IJ SOLN
INTRAMUSCULAR | Status: AC
  Filled 2023-03-20: qty 2

## 2023-03-20 MED ORDER — DEXAMETHASONE SODIUM PHOSPHATE 10 MG/ML IJ SOLN
INTRAMUSCULAR | Status: DC | PRN
Start: 2023-03-20 — End: 2023-03-20
  Administered 2023-03-20: 5 mg via INTRAVENOUS

## 2023-03-20 MED ORDER — ONDANSETRON HCL 4 MG/2ML IJ SOLN
INTRAMUSCULAR | Status: DC | PRN
  Administered 2023-03-20: 4 mg via INTRAVENOUS

## 2023-03-20 MED ORDER — DEXAMETHASONE SODIUM PHOSPHATE 10 MG/ML IJ SOLN
INTRAMUSCULAR | Status: AC
  Filled 2023-03-20: qty 1

## 2023-03-20 MED ORDER — OXYCODONE HCL 5 MG PO TABS
ORAL_TABLET | ORAL | Status: AC
  Filled 2023-03-20: qty 1

## 2023-03-20 MED ORDER — MIDAZOLAM HCL 2 MG/2ML IJ SOLN
INTRAMUSCULAR | Status: DC | PRN
  Administered 2023-03-20: 2 mg via INTRAVENOUS

## 2023-03-20 MED ORDER — SODIUM CHLORIDE 0.9 % IR SOLN
Status: DC | PRN
  Administered 2023-03-20: 3000 mL

## 2023-03-20 MED ORDER — CEFAZOLIN SODIUM-DEXTROSE 2-4 GM/100ML-% IV SOLN
2.0000 g | INTRAVENOUS | Status: AC
  Administered 2023-03-20: 2 g via INTRAVENOUS
  Filled 2023-03-20: qty 100

## 2023-03-20 MED ORDER — ACETAMINOPHEN 500 MG PO TABS
1000.0000 mg | ORAL_TABLET | Freq: Once | ORAL | Status: AC
  Administered 2023-03-20: 1000 mg via ORAL
  Filled 2023-03-20: qty 2

## 2023-03-20 MED ORDER — ACETAMINOPHEN 325 MG PO TABS: 325.0000 mg | ORAL_TABLET | ORAL | Status: DC | PRN

## 2023-03-20 MED ORDER — TAMSULOSIN HCL 0.4 MG PO CAPS: 0.4000 mg | ORAL_CAPSULE | Freq: Every day | ORAL | 0 refills | Status: DC

## 2023-03-20 MED ORDER — FENTANYL CITRATE (PF) 100 MCG/2ML IJ SOLN
INTRAMUSCULAR | Status: AC
  Filled 2023-03-20: qty 2

## 2023-03-20 MED ORDER — INSULIN ASPART 100 UNIT/ML IJ SOLN: 0.0000 [IU] | INTRAMUSCULAR | Status: DC | PRN

## 2023-03-20 MED ORDER — CEPHALEXIN 500 MG PO CAPS: 500.0000 mg | ORAL_CAPSULE | Freq: Two times a day (BID) | ORAL | 0 refills | Status: AC

## 2023-03-20 MED ORDER — LIDOCAINE HCL (PF) 2 % IJ SOLN
INTRAMUSCULAR | Status: DC | PRN
  Administered 2023-03-20: 80 mg via INTRADERMAL

## 2023-03-20 MED ORDER — OXYCODONE HCL 5 MG PO TABS
5.0000 mg | ORAL_TABLET | Freq: Once | ORAL | Status: AC | PRN
  Administered 2023-03-20: 5 mg via ORAL

## 2023-03-20 MED ORDER — KETOROLAC TROMETHAMINE 30 MG/ML IJ SOLN
INTRAMUSCULAR | Status: DC | PRN
  Administered 2023-03-20: 15 mg via INTRAVENOUS

## 2023-03-20 MED ORDER — ONDANSETRON HCL 4 MG/2ML IJ SOLN
INTRAMUSCULAR | Status: AC
  Filled 2023-03-20: qty 2

## 2023-03-20 MED ORDER — CHLORHEXIDINE GLUCONATE 0.12 % MT SOLN
15.0000 mL | Freq: Once | OROMUCOSAL | Status: AC
  Administered 2023-03-20: 15 mL via OROMUCOSAL

## 2023-03-20 MED ORDER — LACTATED RINGERS IV SOLN: INTRAVENOUS | Status: DC

## 2023-03-20 MED ORDER — FENTANYL CITRATE (PF) 100 MCG/2ML IJ SOLN
INTRAMUSCULAR | Status: DC | PRN
  Administered 2023-03-20 (×3): 25 ug via INTRAVENOUS

## 2023-03-20 SURGICAL SUPPLY — 20 items
BAG URO CATCHER STRL LF (MISCELLANEOUS) ×2 IMPLANT
BASKET ZERO TIP NITINOL 2.4FR (BASKET) IMPLANT
CATH URETL OPEN 5X70 (CATHETERS) ×2 IMPLANT
CLOTH BEACON ORANGE TIMEOUT ST (SAFETY) ×2 IMPLANT
EXTRACTOR STONE NITINOL NGAGE (UROLOGICAL SUPPLIES) IMPLANT
FIBER LASER MOSES 200 DFL (Laser) IMPLANT
FIBER LASER MOSES 365 DFL (Laser) IMPLANT
GLOVE SURG LX STRL 7.5 STRW (GLOVE) ×2 IMPLANT
GOWN STRL SURGICAL XL XLNG (GOWN DISPOSABLE) ×2 IMPLANT
GUIDEWIRE STR DUAL SENSOR (WIRE) IMPLANT
GUIDEWIRE ZIPWRE .038 STRAIGHT (WIRE) ×2 IMPLANT
KIT TURNOVER KIT A (KITS) IMPLANT
MANIFOLD NEPTUNE II (INSTRUMENTS) ×2 IMPLANT
PACK CYSTO (CUSTOM PROCEDURE TRAY) ×2 IMPLANT
SHEATH NAVIGATOR HD 11/13X36 (SHEATH) IMPLANT
SHEATH NAVIGATOR HD 12/14X36 (SHEATH) IMPLANT
STENT URET 6FRX24 CONTOUR (STENTS) IMPLANT
STENT URET 6FRX26 CONTOUR (STENTS) IMPLANT
TUBING CONNECTING 10 (TUBING) ×2 IMPLANT
TUBING UROLOGY SET (TUBING) ×2 IMPLANT

## 2023-03-20 NOTE — Anesthesia Postprocedure Evaluation (Signed)
 Anesthesia Post Note  Patient: SELENA SWAMINATHAN  Procedure(s) Performed: CYSTOSCOPY/RIGHT URETEROSCOPY/HOLMIUM LASER/STENT PLACEMENT (Right: Ureter)     Patient location during evaluation: PACU Anesthesia Type: General Level of consciousness: awake and alert Pain management: pain level controlled Vital Signs Assessment: post-procedure vital signs reviewed and stable Respiratory status: spontaneous breathing, nonlabored ventilation, respiratory function stable and patient connected to nasal cannula oxygen Cardiovascular status: blood pressure returned to baseline and stable Postop Assessment: no apparent nausea or vomiting Anesthetic complications: no   No notable events documented.  Last Vitals:  Vitals:   03/20/23 0915 03/20/23 0930  BP: 107/68   Pulse: 66 65  Resp: 13 13  Temp:    SpO2: 99% 99%    Last Pain:  Vitals:   03/20/23 0930  TempSrc:   PainSc: 0-No pain                 Angalina Ante

## 2023-03-20 NOTE — Anesthesia Procedure Notes (Signed)
 Procedure Name: LMA Insertion Date/Time: 03/20/2023 7:41 AM  Performed by: Florene Route, CRNAPatient Re-evaluated:Patient Re-evaluated prior to induction Oxygen Delivery Method: Circle system utilized Preoxygenation: Pre-oxygenation with 100% oxygen Induction Type: IV induction Ventilation: Mask ventilation without difficulty LMA: LMA inserted LMA Size: 3.0 Number of attempts: 1 Placement Confirmation: positive ETCO2 and breath sounds checked- equal and bilateral Tube secured with: Tape Dental Injury: Teeth and Oropharynx as per pre-operative assessment

## 2023-03-20 NOTE — Transfer of Care (Signed)
 Immediate Anesthesia Transfer of Care Note  Patient: Angel Gordon  Procedure(s) Performed: CYSTOSCOPY/RIGHT URETEROSCOPY/HOLMIUM LASER/STENT PLACEMENT (Right: Ureter)  Patient Location: PACU  Anesthesia Type:General  Level of Consciousness: drowsy  Airway & Oxygen Therapy: Patient Spontanous Breathing and Patient connected to face mask oxygen  Post-op Assessment: Report given to RN and Post -op Vital signs reviewed and stable  Post vital signs: Reviewed and stable  Last Vitals:  Vitals Value Taken Time  BP 113/69 03/20/23 0900  Temp    Pulse 71 03/20/23 0900  Resp 17 03/20/23 0900  SpO2 100 % 03/20/23 0900  Vitals shown include unfiled device data.  Last Pain:  Vitals:   03/20/23 0548  TempSrc: Oral  PainSc: 0-No pain      Patients Stated Pain Goal: 5 (03/20/23 0548)  Complications: No notable events documented.

## 2023-03-20 NOTE — Addendum Note (Signed)
 Addendum  created 03/20/23 1514 by Florene Route, CRNA   Intraprocedure Event edited

## 2023-03-20 NOTE — Op Note (Signed)
 Operative Note  Preoperative diagnosis:  1.  1 cm right renal stone  Postoperative diagnosis: 1.  1 cm right renal stone 2.  Meatal stenosis  Procedure(s): 1.  Cystoscopy with right ureteroscopy, holmium laser lithotripsy and right ureteral stent placement 2.  Right retrograde pyelogram with intraoperative interpretation fluoroscopic imaging 3.  Urethral dilation  Surgeon: Rhoderick Moody, MD  Assistants:  None  Anesthesia:  General  Complications:  None  EBL: Less than 5 mL  Specimens: 1.  None  Drains/Catheters: 1.  Right 6 French, 24 cm JJ stent without tether  Intraoperative findings:   Meatal stenosis requiring female sound urethral dilation No other intravesical or urethral abnormalities were seen Right retrograde pyelogram revealed no filling defects along the entire length of the right ureter.  There was a filling defect within a lower pole calyx consistent with her nonobstructing right renal stone.  There were no other filling defects seen within the right renal pelvis or the remaining renal calyces.  Indication:  Angel Gordon is a 68 y.o. female with a history of hematuria.  She recently underwent a CT urogram and was found to have a nonobstructing right renal stone with no other urologic abnormalities.  She has been consented for the above procedures, voices understanding and wishes to proceed.  Description of procedure:  After informed consent was obtained, the patient was brought to the operating room and general LMA anesthesia was administered. The patient was then placed in the dorsolithotomy position and prepped and draped in the usual sterile fashion. A timeout was performed.  I attempted to insert a 21 French rigid cystoscope into the urethra, but the patient was found to have meatal stenosis.  Rigid female sounds were then used to dilate the urethra starting at 9 [place] and progressing up to 24 [place], 2 [place] increments.  The dilation allowed the  insertion of the 21 French rigid cystoscope and a complete bladder survey revealed no other intravesical abnormalities.  A 5 French ureteral catheter was then inserted into the right ureteral orifice and a retrograde pyelogram was obtained, with the findings listed above.  A Glidewire was then used to intubate the lumen of the ureteral catheter and was advanced up to the right renal pelvis, under fluoroscopic guidance.  The catheter was then removed, leaving the wire in place.  An additional sensor wire was then advanced up the right ureter and into good position within the right renal pelvis, confirming placement via fluoroscopic guidance.  A 12 French, 35 cm ureteral access sheath was then advanced over the sensor wire and into good position within the proximal aspects of the right ureter, under fluoroscopic guidance.  A flexible ureteroscope was then advanced through the ureteral access sheath and into the right renal pelvis where her 1 cm lower pole stone was identified.  A 200 m holmium laser was then used to fracture the stone into less than 2 mm fragments.  A full inspection of the renal pelvis and remaining right renal calyces revealed no sizable stone burden.  The flexible ureteroscope and ureteral access sheath were then removed under direct vision, identifying no evidence of luminal stone burden or ureteral trauma.  A 6 French, 24 cm JJ stent was then advanced over the Glidewire and into good position within the right collecting system, confirming placement via fluoroscopy.  The patient's bladder was then drained.  She tolerated the procedure well and was transferred to the postanesthesia in stable condition.  Plan: Follow-up in 1 week for  office cystoscopy and stent removal

## 2023-03-20 NOTE — H&P (Signed)
 Office Visit Report     02/18/2023   --------------------------------------------------------------------------------   Angel Gordon  MRN: [id number]  DOB: 10-13-1955, 68 year old Female  PRIMARY CARE:  Etta Grandchild, MD  PRIMARY CARE FAX:  [phone]  REFERRING:  Etta Grandchild, MD  PROVIDER:  Rhoderick Moody, M.D.  LOCATION:  [facility] [phone]     --------------------------------------------------------------------------------   CC/HPI: CC: Hematuria   HPI: Ms. Angel Gordon is a 68 year old female referred by Dr. Marcelle Overlie for a hematuria evaluation.   10/29/22:  -MH noted on multiple UAs---no previously episodes of gross hematuria  -Smoking history: non-smoker  -History of nephrolithiasis: Noted to have a 5 mm right renal stone on KUB from 2017--never passed a stone or required surgical intervention  -History of CKD: Stage III CKD  -Anti-coagulant/anti-platelet use: denies  -Hematologic disorders: denies  -History of UTIs: denies  -History of GU surgery/trauma: denies  -Personal/family history of GU malignancies: denies  -Estrogen status: Post-menopausal   02/18/23: The patient is here today for a routine follow-up. Her recent CTU showed a 10 mm right lower pole renal stone (1200 HU) with no other GU abnormalities. She denies flank pain, dysuria or hematuria.     ALLERGIES: No Allergies    MEDICATIONS: Metformin Hcl  Aspirin Ec 81 mg tablet, delayed release  Atorvastatin Calcium 10 mg tablet  Farxiga 10 mg tablet  Fluticasone Propionate PRN  Gabapentin 300 mg capsule capsule PRN  Kerendia 20 mg tablet  Mounjaro 7.5 mg/0.5 ml pen injector     GU PSH: No GU PSH      PSH Notes: D & C   NON-GU PSH: Bilateral Tubal Ligation C-Section - 1979     GU PMH: Microscopic hematuria - 11/06/2022, - 10/29/2022 Chronic kidney disease stage 3 (GFR 30-60) - 10/29/2022 Renal calculus (Stable) - 10/29/2022, (Stable), Right, I have reassured the patient  that the stone in the lower pole of her right kidney is unchanged. It was not causing any obstruction and therefore this is not contributing to the elevation of her creatinine. I did note that her BUN had increased between the 2 values and I therefore recommended that she hydrate vigorously before she gets her blood drawn in the future when a creatinine is being checked and she does not have to be fasting., - 2020 (Stable), Right, Her right renal calculus remains stable. It measured 9.8 mm on her previous CT scan and currently measures 10 mm. She has not developed any new right or left renal calculi. I will have her return again in 1 year for a KUB, - 2019, Right, She definitely has a right renal calculus and there is a question of a left renal calculus on ultrasound although it was read as a stone I didn't see posterior shadowing so it may not be in fact a stone. When I recommended is that we continue to follow her stones with a KUB in 1 year and in the meantime I have given her information on stone prevention., - 2018 Acute vaginitis, She describes acute candidal vaginitis. Ever prescription for fluconazole. - 2019 Urinary Frequency, She has developed some urinary frequency. I did not see any stones in the area of the distal ureter and her urine had no sign of infection so I gave her some Myrbetriq samples. - 2019 Flank Pain, Right, The pain she described is actually in her side over on the right knot in the flank and it has resolved. The stone in  her right kidney has been present for over a year and with no hydronephrosis I have reassured her that the stone in her kidney is not causing the pain that had occurred - 2018      PMH Notes: fatty liver, chronic kidney disease   NON-GU PMH: Diabetes Type 2    FAMILY HISTORY: Breast Cancer - Mother Colon Cancer - Grandmother Heart Attack - Mother Hypertension - Mother kidney disease - Runs in Family Kidney Failure - Mother Kidney Stones - Mother    SOCIAL HISTORY: Marital Status: Married Preferred Language: English; Ethnicity: Not Hispanic Or Latino Current Smoking Status: Patient has never smoked.   Tobacco Use Assessment Completed: Used Tobacco in last 30 days? Has never drank.  Drinks 1 caffeinated drink per day.    REVIEW OF SYSTEMS:    GU Review Female:   Patient denies frequent urination, hard to postpone urination, burning /pain with urination, get up at night to urinate, leakage of urine, stream starts and stops, trouble starting your stream, have to strain to urinate, and being pregnant.  Gastrointestinal (Upper):   Patient denies nausea, vomiting, and indigestion/ heartburn.  Gastrointestinal (Lower):   Patient denies diarrhea and constipation.  Constitutional:   Patient denies fever, night sweats, weight loss, and fatigue.  Skin:   Patient denies skin rash/ lesion and itching.  Eyes:   Patient denies blurred vision and double vision.  Ears/ Nose/ Throat:   Patient denies sore throat and sinus problems.  Hematologic/Lymphatic:   Patient denies swollen glands and easy bruising.  Cardiovascular:   Patient denies leg swelling and chest pains.  Respiratory:   Patient denies cough and shortness of breath.  Endocrine:   Patient denies excessive thirst.  Musculoskeletal:   Patient denies back pain and joint pain.  Neurological:   Patient denies dizziness and headaches.  Psychologic:   Patient denies depression and anxiety.   VITAL SIGNS: None   MULTI-SYSTEM PHYSICAL EXAMINATION:    Constitutional: Well-nourished. No physical deformities. Normally developed. Good grooming.  Neurologic / Psychiatric: Oriented to time, oriented to place, oriented to person. No depression, no anxiety, no agitation.     Complexity of Data:  Records Review:   Previous Patient Records  X-Ray Review: C.T. Abdomen/Pelvis: Reviewed Films. Reviewed Report.    Notes:                     CLINICAL DATA: Microscopic hematuria. History renal stones.   Asymptomatic.   EXAM:  CT ABDOMEN AND PELVIS WITHOUT CONTRAST   TECHNIQUE:  Multidetector CT imaging of the abdomen and pelvis was performed  following the standard protocol without IV contrast.   RADIATION DOSE REDUCTION: This exam was performed according to the  departmental dose-optimization program which includes automated  exposure control, adjustment of the mA and/or kV according to  patient size and/or use of iterative reconstruction technique.   COMPARISON: 08/01/2017 from [place]   FINDINGS:  Lower chest: Clear lung bases. Normal heart size without pericardial  or pleural effusion. Small hiatal hernia.   Hepatobiliary: Normal liver. Normal gallbladder, without biliary  ductal dilatation.   Pancreas: Fatty replaced pancreatic head and uncinate process. No  duct dilatation or acute inflammation.   Spleen: Normal in size, without focal abnormality.   Adrenals/Urinary Tract: Normal adrenal glands. Lower pole right  renal collecting system stone measures 10 mm on 29/2 and is  unchanged. No left renal calculi or hydronephrosis. No hydroureter  or ureteric calculi. No bladder calculi.  Stomach/Bowel: Normal remainder of the stomach. Colonic stool burden  suggests constipation. Normal terminal ileum and appendix. Normal  small bowel.   Vascular/Lymphatic: Normal caliber of the aorta and branch vessels.  No abdominopelvic adenopathy.   Reproductive: Normal uterus and adnexa.   Other: No significant free fluid.   Musculoskeletal: No acute osseous abnormality.   IMPRESSION:  1. 10 mm lower pole right renal collecting system calculus, similar  to 08/01/2017.  2. No obstructive uropathy or other explanation for hematuria.  3. Small hiatal hernia  4. Possible constipation.    Electronically Signed  By: Jeronimo Greaves M.D.  On: 11/15/2022 08:41     PROCEDURES:          Urinalysis w/Scope Dipstick Dipstick Cont'd Micro  Color: Yellow Bilirubin: Neg mg/dL  WBC/hpf: NS (Not Seen)  Appearance: Clear Ketones: Neg mg/dL RBC/hpf: 3 - 16/XWR  Specific Gravity: 1.015 Blood: 1+ ery/uL Bacteria: Few (10-25/hpf)  pH: <=5.0 Protein: Neg mg/dL Cystals: NS (Not Seen)  Glucose: 2+ mg/dL Urobilinogen: 0.2 mg/dL Casts: NS (Not Seen)    Nitrites: Neg Trichomonas: Not Present    Leukocyte Esterase: Neg leu/uL Mucous: Present      Epithelial Cells: NS (Not Seen)      Yeast: NS (Not Seen)      Sperm: Not Present    Notes: **QNS for spun micro**    ASSESSMENT:      ICD-10 Details  1 GU:   Renal calculus - N20.0 Chronic, Stable  2   Microscopic hematuria - R31.1 Chronic, Stable   PLAN:           Orders Labs Urine Culture          Schedule Return Visit/Planned Activity: Next Available Appointment - Schedule Surgery          Document Letter(s):  Created for Etta Grandchild, MD   Created for Patient: Clinical Summary         Notes:   The risks, benefits and alternatives of cystoscopy with RIGHT ureteroscopy, laser lithotripsy and ureteral stent placement was discussed the patient. Risks included, but are not limited to: bleeding, urinary tract infection, ureteral injury/avulsion, ureteral stricture formation, retained stone fragments, the possibility that multiple surgeries may be required to treat the stone(s), MI, stroke, PE and the inherent risks of general anesthesia. The patient voices understanding and wishes to proceed.

## 2023-03-21 ENCOUNTER — Encounter (HOSPITAL_COMMUNITY): Payer: Self-pay | Admitting: Urology

## 2023-03-29 ENCOUNTER — Other Ambulatory Visit: Payer: Self-pay | Admitting: Internal Medicine

## 2023-03-29 DIAGNOSIS — E785 Hyperlipidemia, unspecified: Secondary | ICD-10-CM

## 2023-04-17 ENCOUNTER — Ambulatory Visit (INDEPENDENT_AMBULATORY_CARE_PROVIDER_SITE_OTHER): Admitting: Internal Medicine

## 2023-04-17 ENCOUNTER — Ambulatory Visit: Payer: Self-pay

## 2023-04-17 ENCOUNTER — Ambulatory Visit (INDEPENDENT_AMBULATORY_CARE_PROVIDER_SITE_OTHER)

## 2023-04-17 ENCOUNTER — Encounter: Payer: Self-pay | Admitting: Internal Medicine

## 2023-04-17 VITALS — BP 124/78 | HR 75 | Temp 97.9°F | Ht 59.0 in | Wt 132.0 lb

## 2023-04-17 DIAGNOSIS — Z7985 Long-term (current) use of injectable non-insulin antidiabetic drugs: Secondary | ICD-10-CM

## 2023-04-17 DIAGNOSIS — N1831 Chronic kidney disease, stage 3a: Secondary | ICD-10-CM

## 2023-04-17 DIAGNOSIS — Z7984 Long term (current) use of oral hypoglycemic drugs: Secondary | ICD-10-CM

## 2023-04-17 DIAGNOSIS — I10 Essential (primary) hypertension: Secondary | ICD-10-CM | POA: Diagnosis not present

## 2023-04-17 DIAGNOSIS — M25512 Pain in left shoulder: Secondary | ICD-10-CM | POA: Insufficient documentation

## 2023-04-17 DIAGNOSIS — E1122 Type 2 diabetes mellitus with diabetic chronic kidney disease: Secondary | ICD-10-CM

## 2023-04-17 MED ORDER — MELOXICAM 15 MG PO TABS: 15.0000 mg | ORAL_TABLET | Freq: Every day | ORAL | 0 refills | Status: DC | PRN

## 2023-04-17 NOTE — Telephone Encounter (Signed)
  Chief Complaint: Pain Symptoms: shoulder & finger pain Frequency: onging X 1 week Pertinent Negatives: Patient denies all other symptoms Disposition: [] ED /[] Urgent Care (no appt availability in office) / [x] Appointment(In office/virtual)/ []  [facility]/ [] Home Care/ [] Refused Recommended Disposition /[] [place] [facility]/ []  Follow-up with PCP Additional Notes:  Last week she tripped and fell in her yard, she was wearing oversized shoes that caused her to trip. She developed tenderness in her left shoulder and 3rd finger. She was not evaluated post fall and has been treating this at home with heat but has not noticed any improvement. Scheduled acute visit with an alternate provider. Educated on care advice as documented in protocol, patient verbalized understanding.   Copied from CRM [id number]. Topic: Clinical - Red Word Triage >> Apr 17, 2023 11:52 AM Pascal Lux wrote: Red Word that prompted transfer to Nurse Triage: Patient stated she fell last week. Reason for Disposition  [1] MODERATE pain (e.g., interferes with normal activities) AND [2] high-risk adult (e.g., age > 60 years, osteoporosis, chronic steroid use)  Protocols used: Shoulder Injury-A-AH

## 2023-04-17 NOTE — Progress Notes (Signed)
 Patient ID: Angel Gordon, female   DOB: 12-Oct-1955, 68 y.o.   MRN: [id number]        Chief Complaint: follow up with 1 wk persistent pain swelling reduced ROM after fall to outstretched hand        HPI:  Angel Gordon is a 69 y.o. female here with above, cannot raise hand overhead or arm above shoulder level , pain mod to severe at times, swelling noted as well.  Pt denies chest pain, increased sob or doe, wheezing, orthopnea, PND, increased LE swelling, palpitations, dizziness or syncope.   Pt denies polydipsia, polyuria, or new focal neuro s/s.          Wt Readings from Last 3 Encounters:  04/17/23 132 lb (59.9 kg)  03/20/23 130 lb (59 kg)  02/25/23 134 lb (60.8 kg)   BP Readings from Last 3 Encounters:  04/17/23 124/78  03/20/23 107/71  03/04/23 107/76         Past Medical History:  Diagnosis Date   Allergy    seasonal   Chronic kidney disease    History of kidney stones    Interstitial lung disease (HCC)    Post-menopausal bleeding 03/16/2019   pt reports going through menopause in her early 23s, no periods for many years, then states now she has a "return of her periods" and is not in menopause.  let pt know to call her gyne to report this bleeding, that it is considered bleeding not her period since she is post menopausal, pt verb understanding.   Type II or unspecified type diabetes mellitus without mention of complication, not stated as uncontrolled    metformin 500mg  bid-   Past Surgical History:  Procedure Laterality Date   BREAST BIOPSY Right 09/07/2022   [place] RT BREAST BX W LOC DEV 1ST LESION IMG BX SPEC US GUIDE 09/07/2022 [facility]   COLONOSCOPY  2010   CYSTOSCOPY/URETEROSCOPY/HOLMIUM LASER/STENT PLACEMENT Right 03/20/2023   Procedure: CYSTOSCOPY/RIGHT URETEROSCOPY/HOLMIUM LASER/STENT PLACEMENT;  Surgeon: Rene Paci, MD;  Location: [facility];  Service: Urology;  Laterality: Right;  60 MINUTES NEEDED   DILATION AND CURETTAGE OF UTERUS      FOOT SURGERY  2002   HYSTEROSCOPY WITH D & C  08/17/2011   Procedure: DILATATION AND CURETTAGE /HYSTEROSCOPY;  Surgeon: Zelphia Cairo, MD;  Location: [facility];  Service: Gynecology;  Laterality: N/A;   TUBAL LIGATION      reports that she has never smoked. She has been exposed to tobacco smoke. She has never used smokeless tobacco. She reports that she does not drink alcohol and does not use drugs. family history includes Breast cancer in her mother; Cancer in her maternal grandmother and mother; Colon cancer in her maternal grandmother; Colon polyps in her maternal grandmother; Diabetes in her brother; Heart disease in her mother; Hypertension in her mother and son; Kidney disease in her maternal grandmother and mother; Liver cancer in her brother; Stroke in her maternal grandmother and mother; Thyroid disease in her mother. Allergies  Allergen Reactions   Metformin And Related Diarrhea   Current Outpatient Medications on File Prior to Visit  Medication Sig Dispense Refill   Aspirin 81 MG CAPS Take 81 mg by mouth daily.     atorvastatin (LIPITOR) 10 MG tablet TAKE 1 TABLET DAILY 90 tablet 1   Continuous Glucose Receiver (FREESTYLE LIBRE 3 READER) DEVI 1 Act by Does not apply route daily. 1 each 3   Continuous Glucose Sensor (FREESTYLE LIBRE 14 DAY SENSOR)  MISC USE AS DIRECTED 6 each 3   dapagliflozin propanediol (FARXIGA) 10 MG TABS tablet Take 1 tablet (10 mg total) by mouth daily. 90 tablet 1   Finerenone (KERENDIA) 20 MG TABS Take 1 tablet (20 mg total) by mouth daily. 90 tablet 0   oxybutynin (DITROPAN) 5 MG tablet Take 1 tablet (5 mg total) by mouth every 8 (eight) hours as needed for bladder spasms. 30 tablet 1   tamsulosin (FLOMAX) 0.4 MG CAPS capsule Take 1 capsule (0.4 mg total) by mouth daily. 30 capsule 0   tirzepatide (MOUNJARO) 7.5 MG/0.5ML Pen Inject 7.5 mg into the skin once a week. 6 mL 1   oxyCODONE-acetaminophen (PERCOCET) 5-325 MG tablet Take 1 tablet by mouth every 4 (four)  hours as needed for severe pain (pain score 7-10). (Patient not taking: Reported on 04/17/2023) 20 tablet 0   No current facility-administered medications on file prior to visit.        ROS:  All others reviewed and negative.  Objective        PE:  BP 124/78 (BP Location: Right Arm, Patient Position: Sitting, Cuff Size: Normal)   Pulse 75   Temp 97.9 F (36.6 C) (Oral)   Ht 4\' 11"  (1.499 m)   Wt 132 lb (59.9 kg)   LMP 02/23/2011   SpO2 99%   BMI 26.66 kg/m                 Constitutional: Pt appears in NAD               HENT: Head: NCAT.                Right Ear: External ear normal.                 Left Ear: External ear normal.                Eyes: . Pupils are equal, round, and reactive to light. Conjunctivae and EOM are normal               Nose: without d/c or deformity               Neck: Neck supple. Gross normal ROM               Cardiovascular: Normal rate and regular rhythm.                 Pulmonary/Chest: Effort normal and breath sounds without rales or wheezing.                Left shoulder diffuse tender mild swelling, and reduced ROM to 90 degree to abduction and forward elevation               Neurological: Pt is alert. At baseline orientation, motor grossly intact               Skin: Skin is warm. No rashes, no other new lesions, LE edema - none               Psychiatric: Pt behavior is normal without agitation   Micro: none  Cardiac tracings I have personally interpreted today:  none  Pertinent Radiological findings (summarize): none   Lab Results  Component Value Date   WBC 5.6 03/04/2023   HGB 13.7 03/04/2023   HCT 44.1 03/04/2023   PLT 216 03/04/2023   GLUCOSE 96 03/04/2023   CHOL 91 08/20/2022   TRIG 43.0 08/20/2022   HDL 48.10  08/20/2022   LDLCALC 34 08/20/2022   ALT 22 01/24/2023   AST 31 01/24/2023   NA 134 (L) 03/04/2023   K 4.3 03/04/2023   CL 101 03/04/2023   CREATININE 0.91 03/04/2023   BUN 17 03/04/2023   CO2 24 03/04/2023   TSH  1.97 05/07/2022   HGBA1C 6.0 01/24/2023   MICROALBUR <0.7 01/24/2023   Assessment/Plan:  Angel Gordon is a 68 y.o. Black or African American [2] female with  has a past medical history of Allergy, Chronic kidney disease, History of kidney stones, Interstitial lung disease (HCC), Post-menopausal bleeding (03/16/2019), and Type II or unspecified type diabetes mellitus without mention of complication, not stated as uncontrolled.  Acute pain of left shoulder Pt with prob left rotater cuff disorder - for mobic 15 every day prn, left shoulder xray, and advised pt to see sports medicine on the first floor if not improved in 2-3 wks; pt declines referral now, does not want cortisone shot  Type 2 diabetes mellitus with stage 3a chronic kidney disease, without long-term current use of insulin (HCC) Lab Results  Component Value Date   HGBA1C 6.0 01/24/2023   Stable, pt to continue current medical treatment farxiga 10 every day, mounjaro 7.5 mg weekly   Essential hypertension BP Readings from Last 3 Encounters:  04/17/23 124/78  03/20/23 107/71  03/04/23 107/76   Stable, pt to continue medical treatment  - diet, wt control  Followup: Return if symptoms worsen or fail to improve.  Oliver Barre, MD 04/17/2023 3:36 PM [facility]

## 2023-04-17 NOTE — Assessment & Plan Note (Signed)
 Lab Results  Component Value Date   HGBA1C 6.0 01/24/2023   Stable, pt to continue current medical treatment farxiga 10 every day, mounjaro 7.5 mg weekly

## 2023-04-17 NOTE — Assessment & Plan Note (Signed)
 Pt with prob left rotater cuff disorder - for mobic 15 every day prn, left shoulder xray, and advised pt to see sports medicine on the first floor if not improved in 2-3 wks; pt declines referral now, does not want cortisone shot

## 2023-04-17 NOTE — Assessment & Plan Note (Signed)
 BP Readings from Last 3 Encounters:  04/17/23 124/78  03/20/23 107/71  03/04/23 107/76   Stable, pt to continue medical treatment  - diet, wt control

## 2023-04-17 NOTE — Patient Instructions (Signed)
 Please take all new medication as prescribed  Please continue all other medications as before, and refills have been done if requested.  Please have the pharmacy call with any other refills you may need.  Please keep your appointments with your specialists as you may have planned  Please go to the XRAY Department in the first floor for the x-ray testing  You will be contacted by phone if any changes need to be made immediately.  Otherwise, you will receive a letter about your results with an explanation, but please check with MyChart first.  Please see Sports Medicine in 2-3 wks if not improved

## 2023-04-19 ENCOUNTER — Ambulatory Visit: Admitting: Family Medicine

## 2023-04-24 ENCOUNTER — Encounter: Payer: Self-pay | Admitting: Internal Medicine

## 2023-04-29 ENCOUNTER — Encounter: Payer: Self-pay | Admitting: Internal Medicine

## 2023-04-29 DIAGNOSIS — M25512 Pain in left shoulder: Secondary | ICD-10-CM

## 2023-05-23 ENCOUNTER — Encounter: Payer: Self-pay | Admitting: Internal Medicine

## 2023-05-24 ENCOUNTER — Other Ambulatory Visit: Payer: Self-pay | Admitting: Internal Medicine

## 2023-05-24 DIAGNOSIS — E118 Type 2 diabetes mellitus with unspecified complications: Secondary | ICD-10-CM

## 2023-05-24 DIAGNOSIS — E1122 Type 2 diabetes mellitus with diabetic chronic kidney disease: Secondary | ICD-10-CM

## 2023-05-24 MED ORDER — FREESTYLE LIBRE 3 PLUS SENSOR MISC: 1.0000 | 1 refills | Status: DC

## 2023-06-24 ENCOUNTER — Encounter: Payer: Self-pay | Admitting: Internal Medicine

## 2023-06-24 ENCOUNTER — Ambulatory Visit (INDEPENDENT_AMBULATORY_CARE_PROVIDER_SITE_OTHER): Admitting: Internal Medicine

## 2023-06-24 ENCOUNTER — Ambulatory Visit: Payer: Self-pay | Admitting: Internal Medicine

## 2023-06-24 VITALS — BP 118/72 | HR 80 | Temp 97.7°F | Resp 16 | Ht 59.0 in | Wt 129.0 lb

## 2023-06-24 DIAGNOSIS — E118 Type 2 diabetes mellitus with unspecified complications: Secondary | ICD-10-CM

## 2023-06-24 DIAGNOSIS — E785 Hyperlipidemia, unspecified: Secondary | ICD-10-CM | POA: Diagnosis not present

## 2023-06-24 DIAGNOSIS — Z7985 Long-term (current) use of injectable non-insulin antidiabetic drugs: Secondary | ICD-10-CM

## 2023-06-24 DIAGNOSIS — N1831 Chronic kidney disease, stage 3a: Secondary | ICD-10-CM

## 2023-06-24 DIAGNOSIS — Z7984 Long term (current) use of oral hypoglycemic drugs: Secondary | ICD-10-CM

## 2023-06-24 DIAGNOSIS — I10 Essential (primary) hypertension: Secondary | ICD-10-CM

## 2023-06-24 LAB — LIPID PANEL
Cholesterol: 114 mg/dL (ref 0–200)
HDL: 60.3 mg/dL (ref >39.00)
LDL Cholesterol: 47 mg/dL (ref 0–99)
NonHDL: 53.84
Total CHOL/HDL Ratio: 2
Triglycerides: 36 mg/dL (ref 0.0–149.0)
VLDL: 7.2 mg/dL (ref 0.0–40.0)

## 2023-06-24 LAB — HEPATIC FUNCTION PANEL
ALT: 21 U/L (ref 0–35)
AST: 29 U/L (ref 0–37)
Albumin: 4.1 g/dL (ref 3.5–5.2)
Alkaline Phosphatase: 88 U/L (ref 39–117)
Bilirubin, Direct: 0.1 mg/dL (ref 0.0–0.3)
Total Bilirubin: 0.3 mg/dL (ref 0.2–1.2)
Total Protein: 8.2 g/dL (ref 6.0–8.3)

## 2023-06-24 LAB — BASIC METABOLIC PANEL WITH GFR
BUN: 20 mg/dL (ref 6–23)
CO2: 26 meq/L (ref 19–32)
Calcium: 10.1 mg/dL (ref 8.4–10.5)
Chloride: 104 meq/L (ref 96–112)
Creatinine, Ser: 1.01 mg/dL (ref 0.40–1.20)
GFR: 58.41 mL/min — ABNORMAL LOW (ref >60.00)
Glucose, Bld: 85 mg/dL (ref 70–99)
Potassium: 4.9 meq/L (ref 3.5–5.1)
Sodium: 138 meq/L (ref 135–145)

## 2023-06-24 LAB — HEMOGLOBIN A1C: Hgb A1c MFr Bld: 5.9 % (ref 4.6–6.5)

## 2023-06-24 LAB — TSH: TSH: 1.88 u[IU]/mL (ref 0.35–5.50)

## 2023-06-24 MED ORDER — DAPAGLIFLOZIN PROPANEDIOL 10 MG PO TABS: 10.0000 mg | ORAL_TABLET | Freq: Every day | ORAL | 1 refills | Status: DC

## 2023-06-24 MED ORDER — MOUNJARO 7.5 MG/0.5ML ~~LOC~~ SOAJ: 7.5000 mg | SUBCUTANEOUS | 1 refills | Status: DC

## 2023-06-24 MED ORDER — ATORVASTATIN CALCIUM 10 MG PO TABS: 10.0000 mg | ORAL_TABLET | Freq: Every day | ORAL | 1 refills | Status: DC

## 2023-06-24 MED ORDER — KERENDIA 20 MG PO TABS: 1.0000 | ORAL_TABLET | Freq: Every day | ORAL | 1 refills | Status: DC

## 2023-06-24 NOTE — Progress Notes (Unsigned)
 Subjective:  Patient ID: Angel Gordon, female    DOB: July 01, 1955  Age: 68 y.o. MRN: [id number]  CC: Hypertension, Hyperlipidemia, and Diabetes   HPI Angel Gordon presents for f/up ----  Discussed the use of AI scribe software for clinical note transcription with the patient, who gave verbal consent to proceed.  History of Present Illness   Angel Gordon is a 68 year old female with hypertension who presents for an EKG and follow-up on her blood pressure management.  She feels 'super fantastic' and has not experienced any chest pain, shortness of breath, dizziness, or lightheadedness during her morning walk.   She mentions that her blood sugar sometimes goes low at night while in bed, but otherwise, she does not report any significant issues with her blood sugar levels.  She is scheduled to see her pulmonary doctor in August and reports no lung symptoms such as coughing, wheezing, or shortness of breath.   She has been to the doctor for shoulder pain, which was diagnosed as arthritis. She has been using Voltaren  and oil for relief but is tired of taking prescribed medications. She has not received cortisone injections.       Outpatient Medications Prior to Visit  Medication Sig Dispense Refill   Aspirin 81 MG CAPS Take 81 mg by mouth daily.     Continuous Glucose Receiver (FREESTYLE LIBRE 3 READER) DEVI 1 Act by Does not apply route daily. 1 each 3   Continuous Glucose Sensor (FREESTYLE LIBRE 3 PLUS SENSOR) MISC Apply 1 Act topically every 14 (fourteen) days. Change sensor every 15 days. 6 each 1   meloxicam  (MOBIC ) 15 MG tablet Take 1 tablet (15 mg total) by mouth daily as needed. 90 tablet 0   oxybutynin  (DITROPAN ) 5 MG tablet Take 1 tablet (5 mg total) by mouth every 8 (eight) hours as needed for bladder spasms. 30 tablet 1   atorvastatin  (LIPITOR) 10 MG tablet TAKE 1 TABLET DAILY 90 tablet 1   dapagliflozin  propanediol (FARXIGA ) 10 MG TABS tablet Take 1 tablet (10  mg total) by mouth daily. 90 tablet 1   Finerenone  (KERENDIA ) 20 MG TABS Take 1 tablet (20 mg total) by mouth daily. 90 tablet 0   oxyCODONE -acetaminophen  (PERCOCET) 5-325 MG tablet Take 1 tablet by mouth every 4 (four) hours as needed for severe pain (pain score 7-10). 20 tablet 0   tamsulosin  (FLOMAX ) 0.4 MG CAPS capsule Take 1 capsule (0.4 mg total) by mouth daily. 30 capsule 0   tirzepatide  (MOUNJARO ) 7.5 MG/0.5ML Pen Inject 7.5 mg into the skin once a week. 6 mL 1   No facility-administered medications prior to visit.    ROS Review of Systems  Objective:  BP 118/72 (BP Location: Left Arm, Patient Position: Sitting)   Pulse 80   Temp 97.7 F (36.5 C) (Temporal)   Resp 16   Ht 4\' 11"  (1.499 m)   Wt 129 lb (58.5 kg)   LMP 02/23/2011   SpO2 99%   BMI 26.05 kg/m   BP Readings from Last 3 Encounters:  06/24/23 118/72  04/17/23 124/78  03/20/23 107/71    Wt Readings from Last 3 Encounters:  06/24/23 129 lb (58.5 kg)  04/17/23 132 lb (59.9 kg)  03/20/23 130 lb (59 kg)    Physical Exam Vitals reviewed.  HENT:     Mouth/Throat:     Mouth: Mucous membranes are moist.  Eyes:     General: No scleral icterus.    Conjunctiva/sclera: Conjunctivae  normal.  Cardiovascular:     Rate and Rhythm: Normal rate and regular rhythm.     Heart sounds: No murmur heard.    No friction rub. No gallop.     Comments: EKG--- NSR, 69 bpm Minimal LVH No Q waves or ST/T wave changes Unchanged  Pulmonary:     Effort: Pulmonary effort is normal.     Breath sounds: No stridor. No wheezing, rhonchi or rales.  Abdominal:     Palpations: There is no mass.     Tenderness: There is no abdominal tenderness. There is no guarding.     Hernia: No hernia is present.  Musculoskeletal:        General: Normal range of motion.     Cervical back: Normal range of motion.     Right lower leg: No edema.     Left lower leg: No edema.  Skin:    General: Skin is warm and dry.  Neurological:      General: No focal deficit present.     Mental Status: Mental status is at baseline.  Psychiatric:        Mood and Affect: Mood normal.        Behavior: Behavior normal.     Lab Results  Component Value Date   WBC 5.6 03/04/2023   HGB 13.7 03/04/2023   HCT 44.1 03/04/2023   PLT 216 03/04/2023   GLUCOSE 85 06/24/2023   CHOL 114 06/24/2023   TRIG 36.0 06/24/2023   HDL 60.30 06/24/2023   LDLCALC 47 06/24/2023   ALT 21 06/24/2023   AST 29 06/24/2023   NA 138 06/24/2023   K 4.9 06/24/2023   CL 104 06/24/2023   CREATININE 1.01 06/24/2023   BUN 20 06/24/2023   CO2 26 06/24/2023   TSH 1.88 06/24/2023   HGBA1C 5.9 06/24/2023   MICROALBUR <0.7 01/24/2023    DG C-Arm 1-60 Min-No Report Result Date: 03/20/2023 Fluoroscopy was utilized by the requesting physician.  No radiographic interpretation.    Assessment & Plan:  Type 2 diabetes mellitus with complication, without long-term current use of insulin  (HCC) -     Hemoglobin A1c; Future -     Basic metabolic panel with GFR; Future -     Dapagliflozin  Propanediol; Take 1 tablet (10 mg total) by mouth daily.  Dispense: 90 tablet; Refill: 1 -     Kerendia ; Take 1 tablet (20 mg total) by mouth daily.  Dispense: 90 tablet; Refill: 1 -     Mounjaro ; Inject 7.5 mg into the skin once a week.  Dispense: 6 mL; Refill: 1  Hyperlipidemia with target LDL less than 100 -     Lipid panel; Future -     TSH; Future -     Hepatic function panel; Future -     Atorvastatin  Calcium ; Take 1 tablet (10 mg total) by mouth daily.  Dispense: 90 tablet; Refill: 1  Essential hypertension -     TSH; Future -     Basic metabolic panel with GFR; Future -     EKG 12-Lead -     Hepatic function panel; Future  Stage 3a chronic kidney disease (HCC) -     Dapagliflozin  Propanediol; Take 1 tablet (10 mg total) by mouth daily.  Dispense: 90 tablet; Refill: 1 -     Kerendia ; Take 1 tablet (20 mg total) by mouth daily.  Dispense: 90 tablet; Refill: 1  Chronic  kidney disease, stage 3a (HCC) -     Dapagliflozin  Propanediol;  Take 1 tablet (10 mg total) by mouth daily.  Dispense: 90 tablet; Refill: 1 -     Kerendia ; Take 1 tablet (20 mg total) by mouth daily.  Dispense: 90 tablet; Refill: 1  Type 2 diabetes mellitus with stage 3a chronic kidney disease, without long-term current use of insulin  (HCC) -     Kerendia ; Take 1 tablet (20 mg total) by mouth daily.  Dispense: 90 tablet; Refill: 1     Follow-up: Return in about 6 months (around 12/24/2023).  Sandra Crouch, MD

## 2023-06-24 NOTE — Patient Instructions (Signed)

## 2023-07-01 ENCOUNTER — Encounter: Payer: Self-pay | Admitting: Internal Medicine

## 2023-07-03 ENCOUNTER — Other Ambulatory Visit: Payer: Self-pay

## 2023-07-03 MED ORDER — FLUTICASONE PROPIONATE 50 MCG/ACT NA SUSP
2.0000 | Freq: Every day | NASAL | 6 refills | Status: AC
Start: 2023-07-03 — End: ?

## 2023-07-12 ENCOUNTER — Other Ambulatory Visit: Payer: Self-pay

## 2023-07-12 ENCOUNTER — Other Ambulatory Visit: Payer: Self-pay | Admitting: Internal Medicine

## 2023-08-06 ENCOUNTER — Encounter: Payer: Self-pay | Admitting: Plastic Surgery

## 2023-08-06 ENCOUNTER — Other Ambulatory Visit: Payer: Self-pay | Admitting: Internal Medicine

## 2023-08-06 ENCOUNTER — Ambulatory Visit (INDEPENDENT_AMBULATORY_CARE_PROVIDER_SITE_OTHER): Payer: Self-pay | Admitting: Plastic Surgery

## 2023-08-06 VITALS — BP 103/69 | HR 79 | Ht 59.0 in | Wt 128.0 lb

## 2023-08-06 DIAGNOSIS — E118 Type 2 diabetes mellitus with unspecified complications: Secondary | ICD-10-CM

## 2023-08-06 DIAGNOSIS — E881 Lipodystrophy, not elsewhere classified: Secondary | ICD-10-CM | POA: Insufficient documentation

## 2023-08-06 DIAGNOSIS — E785 Hyperlipidemia, unspecified: Secondary | ICD-10-CM

## 2023-08-06 DIAGNOSIS — N1831 Chronic kidney disease, stage 3a: Secondary | ICD-10-CM

## 2023-08-06 NOTE — Progress Notes (Signed)
 Patient ID: Angel Gordon, female    DOB: [birth year], 68 y.o.   MRN: [id number]   Chief Complaint  Patient presents with   Advice Only   Skin Problem    The patient is a 68 year old female here for evaluation of her arms and legs.  She is 4 feet 11 inches tall weighs 128 pounds.  She has lost about 50 pounds in the past year with diet exercise and medication.  Her past medical history is positive for kidney stones and diabetes, obesity and reflux.  Her past surgical history includes foot surgery, tubal ligation and a breast biopsy. Her last hemoglobin A1c was 5.9 in June.  She is interested in removing the excess skin and fat on her legs and does not like the cobblestoning of her skin.  The arms she wants tighter.  I explained that she would have a scar and that the cobblestoning in her legs would probably not improve.    Review of Systems  Constitutional: Negative.   Eyes: Negative.   Respiratory: Negative.    Cardiovascular: Negative.   Gastrointestinal: Negative.   Endocrine: Negative.   Genitourinary: Negative.   Musculoskeletal: Negative.     Past Medical History:  Diagnosis Date   Allergy    seasonal   Chronic kidney disease    History of kidney stones    Interstitial lung disease (HCC)    Post-menopausal bleeding 03/16/2019   pt reports going through menopause in her early 53s, no periods for many years, then states now she has a return of her periods and is not in menopause.  let pt know to call her gyne to report this bleeding, that it is considered bleeding not her period since she is post menopausal, pt verb understanding.   Type II or unspecified type diabetes mellitus without mention of complication, not stated as uncontrolled    metformin  500mg  bid-    Past Surgical History:  Procedure Laterality Date   BREAST BIOPSY Right 09/07/2022   US  RT BREAST BX W LOC DEV 1ST LESION IMG BX SPEC US  GUIDE 09/07/2022 [facility]   COLONOSCOPY  2010    CYSTOSCOPY/URETEROSCOPY/HOLMIUM LASER/STENT PLACEMENT Right 03/20/2023   Procedure: CYSTOSCOPY/RIGHT URETEROSCOPY/HOLMIUM LASER/STENT PLACEMENT;  Surgeon: Devere Lonni Righter, MD;  Location: [facility];  Service: Urology;  Laterality: Right;  60 MINUTES NEEDED   DILATION AND CURETTAGE OF UTERUS     FOOT SURGERY  2002   HYSTEROSCOPY WITH D & C  08/17/2011   Procedure: DILATATION AND CURETTAGE /HYSTEROSCOPY;  Surgeon: Truman Corona, MD;  Location: [facility];  Service: Gynecology;  Laterality: N/A;   TUBAL LIGATION        Current Outpatient Medications:    Aspirin 81 MG CAPS, Take 81 mg by mouth daily., Disp: , Rfl:    atorvastatin  (LIPITOR) 10 MG tablet, Take 1 tablet (10 mg total) by mouth daily., Disp: 90 tablet, Rfl: 1   Continuous Glucose Receiver (FREESTYLE LIBRE 3 READER) DEVI, 1 Act by Does not apply route daily., Disp: 1 each, Rfl: 3   Continuous Glucose Sensor (FREESTYLE LIBRE 3 PLUS SENSOR) MISC, Apply 1 Act topically every 14 (fourteen) days. Change sensor every 15 days., Disp: 6 each, Rfl: 1   dapagliflozin  propanediol (FARXIGA ) 10 MG TABS tablet, Take 1 tablet (10 mg total) by mouth daily., Disp: 90 tablet, Rfl: 1   Finerenone  (KERENDIA ) 20 MG TABS, Take 1 tablet (20 mg total) by mouth daily., Disp: 90 tablet, Rfl: 1   fluticasone  (  FLONASE ) 50 MCG/ACT nasal spray, Place 2 sprays into both nostrils daily., Disp: 16 g, Rfl: 6   meloxicam  (MOBIC ) 15 MG tablet, TAKE 1 TABLET(15 MG) BY MOUTH DAILY AS NEEDED, Disp: 90 tablet, Rfl: 0   oxybutynin  (DITROPAN ) 5 MG tablet, Take 1 tablet (5 mg total) by mouth every 8 (eight) hours as needed for bladder spasms., Disp: 30 tablet, Rfl: 1   tirzepatide  (MOUNJARO ) 7.5 MG/0.5ML Pen, Inject 7.5 mg into the skin once a week., Disp: 6 mL, Rfl: 1   Objective:   Vitals:   08/06/23 0752  BP: 103/69  Pulse: 79  SpO2: 97%    Physical Exam Vitals and nursing note reviewed.  Constitutional:      Appearance: Normal appearance.  HENT:     Head:  Atraumatic.  Cardiovascular:     Rate and Rhythm: Normal rate.     Pulses: Normal pulses.  Pulmonary:     Effort: Pulmonary effort is normal.  Abdominal:     General: There is no distension.     Palpations: Abdomen is soft.  Musculoskeletal:        General: No swelling or deformity.  Skin:    General: Skin is warm.     Capillary Refill: Capillary refill takes less than 2 seconds.     Coloration: Skin is jaundiced.     Findings: No lesion.  Neurological:     Mental Status: She is alert and oriented to person, place, and time.  Psychiatric:        Mood and Affect: Mood normal.        Behavior: Behavior normal.        Thought Content: Thought content normal.        Judgment: Judgment normal.     Assessment & Plan:  Type 2 diabetes mellitus with complication, without long-term current use of insulin  (HCC)  Hyperlipidemia with target LDL less than 100  Lipodystrophy  Patient will probably not undergo surgery she does not want the scarring and is going to continue with her weight loss program.  She knows to let us  know if there is anything that changes.  I did tell her about cool sculpting but that it is not a guarantee for improvement.  Pictures were obtained of the patient and placed in the chart with the patient's or guardian's permission.   Angel RAMAN Tamla Winkels, DO

## 2023-08-11 ENCOUNTER — Encounter: Payer: Self-pay | Admitting: Internal Medicine

## 2023-08-12 ENCOUNTER — Other Ambulatory Visit: Payer: Self-pay | Admitting: Internal Medicine

## 2023-08-12 DIAGNOSIS — N1831 Chronic kidney disease, stage 3a: Secondary | ICD-10-CM

## 2023-08-12 DIAGNOSIS — E1122 Type 2 diabetes mellitus with diabetic chronic kidney disease: Secondary | ICD-10-CM

## 2023-08-12 DIAGNOSIS — E118 Type 2 diabetes mellitus with unspecified complications: Secondary | ICD-10-CM

## 2023-08-12 NOTE — Telephone Encounter (Unsigned)
 Copied from CRM [id number]. Topic: Clinical - Medication Refill >> Aug 12, 2023  2:41 PM Rosina BIRCH wrote: Medication: Finerenone  (KERENDIA ) 20 MG TABS  Has the patient contacted their pharmacy? Yes (Agent: If no, request that the patient contact the pharmacy for the refill. If patient does not wish to contact the pharmacy document the reason why and proceed with request.) (Agent: If yes, when and what did the pharmacy advise?)  This is the patient's preferred pharmacy:  [id number] [address], [place] - [address] AT [facility] OF [facility] & [address] [place] [address] Phone: [phone] Fax: [phone]  Is this the correct pharmacy for this prescription? Yes If no, delete pharmacy and type the correct one.   Has the prescription been filled recently? No  Is the patient out of the medication? Yes  Has the patient been seen for an appointment in the last year OR does the patient have an upcoming appointment? Yes  Can we respond through MyChart? Yes  Agent: Please be advised that Rx refills may take up to 3 business days. We ask that you follow-up with your pharmacy.

## 2023-08-13 ENCOUNTER — Other Ambulatory Visit: Payer: Self-pay | Admitting: Internal Medicine

## 2023-08-13 DIAGNOSIS — E118 Type 2 diabetes mellitus with unspecified complications: Secondary | ICD-10-CM

## 2023-08-13 DIAGNOSIS — N1831 Chronic kidney disease, stage 3a: Secondary | ICD-10-CM

## 2023-08-13 DIAGNOSIS — E1122 Type 2 diabetes mellitus with diabetic chronic kidney disease: Secondary | ICD-10-CM

## 2023-08-13 NOTE — Telephone Encounter (Signed)
 Copied from CRM [id number]. Topic: Clinical - Medication Refill >> Aug 13, 2023  4:09 PM Mia F wrote: Medication: Finerenone  (KERENDIA ) 20 MG TABS   Has the patient contacted their pharmacy? Yes (Agent: If no, request that the patient contact the pharmacy for the refill. If patient does not wish to contact the pharmacy document the reason why and proceed with request.) (Agent: If yes, when and what did the pharmacy advise?)  This is the patient's preferred pharmacy:  [id number] [address], [place] - [address] AT [facility] & [address] [place] [address] Phone: [phone] Fax: [phone]    Is this the correct pharmacy for this prescription? Yes If no, delete pharmacy and type the correct one.   Has the prescription been filled recently? No  Is the patient out of the medication? No  Has the patient been seen for an appointment in the last year OR does the patient have an upcoming appointment? Yes  Can we respond through MyChart? No  Agent: Please be advised that Rx refills may take up to 3 business days. We ask that you follow-up with your pharmacy.

## 2023-08-16 ENCOUNTER — Other Ambulatory Visit: Payer: Self-pay

## 2023-08-16 ENCOUNTER — Telehealth: Payer: Self-pay | Admitting: *Deleted

## 2023-08-16 DIAGNOSIS — N1831 Chronic kidney disease, stage 3a: Secondary | ICD-10-CM

## 2023-08-16 DIAGNOSIS — E118 Type 2 diabetes mellitus with unspecified complications: Secondary | ICD-10-CM

## 2023-08-16 MED ORDER — KERENDIA 20 MG PO TABS: 1.0000 | ORAL_TABLET | Freq: Every day | ORAL | 1 refills | Status: DC

## 2023-08-16 NOTE — Telephone Encounter (Signed)
 Patient returning call to cancel appointment in August and she also said can the ct scan be scheduled the first of October . Because I'm changing jobs trying to get this done before I change insurances

## 2023-08-16 NOTE — Telephone Encounter (Signed)
 Got patient appointment canceled and transferred her over to centralized scheduling to get scheduled for her ct

## 2023-08-16 NOTE — Telephone Encounter (Signed)
 Returned call to patient [id number]   After reviewing last AVS: patient is supposed to have CT scan Oct or Nov 2025 and then f/u. Unless patient has something else going on she could cancel August appt and f/u after CT scan as mentioned.  YOUR PLAN:   -INTERSTITIAL LUNG DISEASE: Interstitial lung disease involves scarring or inflammation in the lungs, which can affect your breathing. Your condition is mild, and you have no respiratory symptoms at this time. We discussed two options: aggressive investigation with a lung biopsy or conservative management with monitoring. You chose conservative management. We will repeat your CT scan in October or November 2025 and monitor for any new symptoms. Please report any new symptoms immediately.   -GENERAL HEALTH MAINTENANCE: You are maintaining good general health by walking 13,000 steps daily without any fatigue or breathing issues. Continue with your current level of physical activity.   INSTRUCTIONS:   Please schedule a follow-up appointment for October or November 2025. In the meantime, monitor for any new symptoms and report them immediately.

## 2023-09-19 ENCOUNTER — Ambulatory Visit: Admitting: Pulmonary Disease

## 2023-09-27 ENCOUNTER — Ambulatory Visit (HOSPITAL_COMMUNITY)

## 2023-09-27 ENCOUNTER — Encounter (HOSPITAL_COMMUNITY): Payer: Self-pay

## 2023-10-07 ENCOUNTER — Ambulatory Visit (HOSPITAL_BASED_OUTPATIENT_CLINIC_OR_DEPARTMENT_OTHER)
Admission: RE | Admit: 2023-10-07 | Discharge: 2023-10-07 | Disposition: A | Discharge status: Home / Self Care | Source: Ambulatory Visit | Attending: Pulmonary Disease | Admitting: Pulmonary Disease

## 2023-10-07 DIAGNOSIS — J849 Interstitial pulmonary disease, unspecified: Secondary | ICD-10-CM | POA: Insufficient documentation

## 2023-10-15 ENCOUNTER — Other Ambulatory Visit: Payer: Self-pay | Admitting: Internal Medicine

## 2023-10-15 ENCOUNTER — Encounter: Payer: Self-pay | Admitting: Internal Medicine

## 2023-10-15 DIAGNOSIS — E118 Type 2 diabetes mellitus with unspecified complications: Secondary | ICD-10-CM

## 2023-10-15 NOTE — Telephone Encounter (Signed)
 Copied from CRM [id number]. Topic: Clinical - Medication Refill >> Oct 15, 2023  1:36 PM Angel Gordon wrote: Medication: tirzepatide  (MOUNJARO ) 7.5 MG/0.5ML Pen [[id number]]  Has the patient contacted their pharmacy? No (Agent: If no, request that the patient contact the pharmacy for the refill. If patient does not wish to contact the pharmacy document the reason why and proceed with request.) (Agent: If yes, when and what did the pharmacy advise?)  This is the patient's preferred pharmacy:  [id number] [address], [place] - [address] AT [facility] OF [facility] & [address] [place] [address] Phone: [phone] Fax: [phone]   Is this the correct pharmacy for this prescription? Yes If no, delete pharmacy and type the correct one.   Has the prescription been filled recently? No  Is the patient out of the medication? Yes, she accidentally wasted the last one.   Has the patient been seen for an appointment in the last year OR does the patient have an upcoming appointment? Yes  Can we respond through MyChart? Yes  Agent: Please be advised that Rx refills may take up to 3 business days. We ask that you follow-up with your pharmacy.

## 2023-10-18 ENCOUNTER — Ambulatory Visit: Payer: Self-pay | Admitting: Pulmonary Disease

## 2023-10-23 NOTE — Telephone Encounter (Signed)
 Copied from CRM #[id number]. Topic: Appointments - Scheduling Inquiry for Clinic >> Oct 23, 2023  8:15 AM Russell PARAS wrote: Reason for CRM:   Pt is unable to make OV on 10/3 due to conflict with work schedule. 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 Is requesting if appt could be changed to Virtual Visit. [facility] is unable to edit appt details and change the visit location. Did review provider/APP schedule and soonest avail is 11/2023.   Requested call back   CB#  [phone]  Can you please advise if it is appropriate to covert patient's 10/25/2023 CT f/u visit to a MyChart Video?

## 2023-10-24 MED ORDER — MOUNJARO 7.5 MG/0.5ML ~~LOC~~ SOAJ: 7.5000 mg | SUBCUTANEOUS | 1 refills | Status: DC

## 2023-10-24 NOTE — Progress Notes (Signed)
 Patient confirms in person visit changed to video. Sending  a message to the front to change appointment. NFN

## 2023-10-24 NOTE — Progress Notes (Signed)
 Done NFN

## 2023-10-25 ENCOUNTER — Encounter: Payer: Self-pay | Admitting: Pulmonary Disease

## 2023-10-25 ENCOUNTER — Telehealth (INDEPENDENT_AMBULATORY_CARE_PROVIDER_SITE_OTHER): Admitting: Pulmonary Disease

## 2023-10-25 VITALS — Ht 59.0 in | Wt 126.0 lb

## 2023-10-25 DIAGNOSIS — J849 Interstitial pulmonary disease, unspecified: Secondary | ICD-10-CM | POA: Diagnosis not present

## 2023-10-25 NOTE — Patient Instructions (Signed)
  VISIT SUMMARY: Today, we reviewed your recent CT scan results and discussed your interstitial lung disease. Your condition remains stable with no new symptoms or changes on the scan.  YOUR PLAN: INTERSTITIAL LUNG DISEASE: Your interstitial lung disease is well-managed and stable. There are no new symptoms or changes on your recent CT scan. -We will order a follow-up CT scan in one year to monitor your condition. -Please report any new symptoms such as shortness of breath immediately.

## 2023-10-25 NOTE — Progress Notes (Signed)
 Angel Gordon    [id number]    September 19, 1955  Primary Care Physician:Jones, Debby CROME, MD  Referring Physician: Joshua Debby CROME, MD [address] [place],  [place] [address]  Virtual Visit via Video Note  I connected with Angel Gordon on 10/25/23 at  9:30 AM EDT by a video enabled telemedicine application and verified that I am speaking with the correct person using two identifiers.  Location: Patient: On bus to work Provider: Pulmonary office, [address]   I discussed the limitations of evaluation and management by telemedicine and the availability of in person appointments. The patient expressed understanding and agreed to proceed.  Chief complaint: Follow-up for ILD, dyspnea  HPI: 68 y.o. who  has a past medical history of Allergy, Chronic kidney disease, History of kidney stones, Interstitial lung disease (HCC), Post-menopausal bleeding (03/16/2019), and Type II or unspecified type diabetes mellitus without mention of complication, not stated as uncontrolled.   Discussed the use of AI scribe software for clinical note transcription with the patient, who gave verbal consent to proceed.  The patient, with a history of diabetes, fatty liver, and stage three chronic kidney disease, presents for a follow-up on CT scan results. She was initially evaluated for suspected heart issues, but the cardiac CT scan revealed lung changes instead. The patient reports no noticeable symptoms such as shortness of breath or cough. She maintains an active lifestyle, walking three times a day, and has not noticed any significant changes in her health. She has experienced some weight loss and decreased appetite, which she attributes to a medication change to Mounjaro  shots. She also mentions a slight discomfort in her chest area. The patient has never smoked, has no known exposure to harmful substances, and has no family history of lung disease.   Follow-up CT shows interstitial lung  disease and patient has elected for conservative management with no further workup or treatment.  Interim History Discussed the use of AI scribe software for clinical note transcription with the patient, who gave verbal consent to proceed. History of Present Illness Angel Gordon is a 68 year old female with interstitial lung disease who presents for follow-up of an abnormal CT scan.  Respiratory symptoms and functional status - No shortness of breath or other respiratory symptoms - Breathing is stable - Able to work and perform normal activities without limitations  Radiographic findings - Recent CT scan shows no change compared to previous scan   Relevant pulmonary history Pets: No pets Occupation: She works from home, having retired from a bank job in 2020 Exposures: No known exposures.  No mold,, Jacuzzi.  No feather pillows or comforters Smoking history: Never smoker Travel history: No significant travel history Relevant family history: No family history of lung disease  Outpatient Encounter Medications as of 10/25/2023  Medication Sig   Aspirin 81 MG CAPS Take 81 mg by mouth daily.   atorvastatin  (LIPITOR) 10 MG tablet Take 1 tablet (10 mg total) by mouth daily.   dapagliflozin  propanediol (FARXIGA ) 10 MG TABS tablet Take 1 tablet (10 mg total) by mouth daily.   Finerenone  (KERENDIA ) 20 MG TABS Take 1 tablet (20 mg total) by mouth daily.   fluticasone  (FLONASE ) 50 MCG/ACT nasal spray Place 2 sprays into both nostrils daily.   tirzepatide  (MOUNJARO ) 7.5 MG/0.5ML Pen Inject 7.5 mg into the skin once a week.   Continuous Glucose Receiver (FREESTYLE LIBRE 3 READER) DEVI 1 Act by Does not apply  route daily.   Continuous Glucose Sensor (FREESTYLE LIBRE 3 PLUS SENSOR) MISC Apply 1 Act topically every 14 (fourteen) days. Change sensor every 15 days.   meloxicam  (MOBIC ) 15 MG tablet TAKE 1 TABLET(15 MG) BY MOUTH DAILY AS NEEDED (Patient not taking: Reported on 10/25/2023)    oxybutynin  (DITROPAN ) 5 MG tablet Take 1 tablet (5 mg total) by mouth every 8 (eight) hours as needed for bladder spasms. (Patient not taking: Reported on 10/25/2023)   [DISCONTINUED] tirzepatide  (MOUNJARO ) 7.5 MG/0.5ML Pen Inject 7.5 mg into the skin once a week.   No facility-administered encounter medications on file as of 10/25/2023.   Vitals:   10/25/23 0919  Height: 4' 11 (1.499 m)  Weight: 126 lb (57.2 kg)  BMI (Calculated): 25.44     Physical Exam Tele    Data Reviewed: Imaging: Cardiac CT 10/24/2022-patchy areas of groundglass attenuation, septal thickening with mild cylindrical bronchiectasis, subcentimeter pulmonary nodules.  High resolution CT 11/07/2022-mild traction bronchiectasis with subpleural groundglass, reticulation with nodularity along the fissures, subcentimeter pulmonary nodules.  High-resolution CT 10/07/2023-subpleural reticulation, mild traction bronchiectasis, subcentimeter pulm nodule.  Unchanged compared to last year.  I reviewed the images personally.  PFTs: 02/22/2023 FVC 2.38 [54%], FEV1 2.00 [64%], F/F84, TLC 4.33 [58%], DLCO 12.75 [49%] Mild restriction, elevated diffusion capacity for measured alveolar volume  Labs: CTD serologies 12/12/2022-ANA 1: 40, nuclear, rheumatoid factor 36, negative angiotensin-converting enzyme, hypersensitivity pneumonitis  Assessment and Plan Interstitial Lung Disease Mild interstitial lung disease with mild reduction in lung capacity noted on CT scan. Differential includes exposure-related causes, autoimmune diseases, or idiopathic origins. No respiratory symptoms, joint pains, or exposures reported. Rheumatoid factor slightly elevated but no clinical symptoms of rheumatoid arthritis. Discussed options: aggressive investigation with lung biopsy or conservative management with monitoring. Patient prefers conservative management due to past painful experience with breast biopsy. Risks of waiting include potential  progression and irreversible lung function loss discussed in detail with patient.  Most recent CT scan which shows stability reviewed with patient and she has no new respiratory symptoms.  - Repeat CT scan in October 2026.  She prefers not to repeat the PFTs due to difficulty with the test. - Monitor for any new symptoms or changes in condition - Report any new symptoms immediately  General Health Maintenance Active, walking 13,000 steps daily without fatigue or dyspnea. No current medications or inhalers. - Continue current level of physical activity   Recommendations: Follow-up high-res CT in 1 year  I discussed the assessment and treatment plan with the patient. The patient was provided an opportunity to ask questions and all were answered. The patient agreed with the plan and demonstrated an understanding of the instructions.   The patient was advised to call back or seek an in-person evaluation if the symptoms worsen or if the condition fails to improve as anticipated.   Lonna Coder MD [facility] 10/25/2023, 9:33 AM  CC: Joshua Debby CROME, MD

## 2023-10-28 ENCOUNTER — Encounter: Payer: Self-pay | Admitting: Internal Medicine

## 2023-11-01 ENCOUNTER — Other Ambulatory Visit: Payer: Self-pay | Admitting: Internal Medicine

## 2023-11-01 DIAGNOSIS — L2084 Intrinsic (allergic) eczema: Secondary | ICD-10-CM

## 2023-11-01 MED ORDER — ZORYVE 0.15 % EX CREA: 1.0000 | TOPICAL_CREAM | Freq: Every day | CUTANEOUS | 2 refills | Status: DC

## 2023-11-05 ENCOUNTER — Encounter: Payer: Self-pay | Admitting: Internal Medicine

## 2023-11-10 ENCOUNTER — Other Ambulatory Visit: Payer: Self-pay | Admitting: Internal Medicine

## 2023-11-10 DIAGNOSIS — E118 Type 2 diabetes mellitus with unspecified complications: Secondary | ICD-10-CM

## 2023-11-10 DIAGNOSIS — N1831 Chronic kidney disease, stage 3a: Secondary | ICD-10-CM

## 2023-11-15 ENCOUNTER — Encounter: Payer: Self-pay | Admitting: Internal Medicine

## 2023-11-19 ENCOUNTER — Telehealth: Payer: Self-pay | Admitting: Internal Medicine

## 2023-11-19 NOTE — Telephone Encounter (Signed)
 Pt dropped off a form to be completed, faxed and called to pick up original. Please advise, Thanks

## 2023-11-22 NOTE — Telephone Encounter (Signed)
 Paperwork has been received and will be reviewed and signed

## 2023-11-25 ENCOUNTER — Telehealth: Payer: Self-pay | Admitting: Pharmacist

## 2023-11-25 NOTE — Telephone Encounter (Signed)
 Copy has been faxed. Patient has been made aware to pick up original. Form placed up front.

## 2023-11-25 NOTE — Telephone Encounter (Signed)
 Called patient regarding Kerendia  - She is switching to [employer] plan and will no longer be able to afford Kerendia  as it will be >$200 per month. We discussed Kerendia  PAP income eligibility and she would not currently meet the cut off based on 2024 income but she should next year (reflecting 2025 income). She has enough Kerendia  to last to December. Once she runs out, she may be able to call our office to see if samples are available. Otherwise, she can be off of Kerendia  for a few months as she still has Farxiga  for kidney protection while waiting for Kerendia  PAP to be completed.  Darrelyn Drum, PharmD, BCPS, CPP Clinical Pharmacist Practitioner [facility] [phone]

## 2023-11-28 ENCOUNTER — Ambulatory Visit: Admitting: Pulmonary Disease

## 2023-12-10 ENCOUNTER — Encounter: Payer: Self-pay | Admitting: Internal Medicine

## 2023-12-10 NOTE — Telephone Encounter (Signed)
 Copied from CRM [id number]. Topic: General - Other >> Dec 10, 2023 10:13 AM Aleatha BROCKS wrote: Reason for CRM: Patient called to inform that  [employer] didn't receive the fax .Please refax & call them because her appointment is next Wednesday so they can approve her insurance    Fax# [phone]   [phone] Verification dept    Please call them & verbal speak to let them know  her  Chronic condition verification form for Diabetes.    Once done call patient to let her know

## 2023-12-12 ENCOUNTER — Other Ambulatory Visit: Payer: Self-pay | Admitting: Medical Genetics

## 2023-12-18 ENCOUNTER — Ambulatory Visit: Payer: Self-pay | Admitting: Internal Medicine

## 2023-12-19 ENCOUNTER — Encounter: Payer: Self-pay | Admitting: Internal Medicine

## 2023-12-23 ENCOUNTER — Other Ambulatory Visit: Payer: Self-pay | Admitting: Internal Medicine

## 2023-12-23 DIAGNOSIS — E785 Hyperlipidemia, unspecified: Secondary | ICD-10-CM

## 2023-12-24 ENCOUNTER — Ambulatory Visit: Admitting: Internal Medicine

## 2023-12-24 ENCOUNTER — Other Ambulatory Visit: Payer: Self-pay | Admitting: Internal Medicine

## 2023-12-25 ENCOUNTER — Other Ambulatory Visit: Payer: Self-pay | Admitting: Internal Medicine

## 2023-12-25 MED ORDER — SCOPOLAMINE 1 MG/3DAYS TD PT72: 1.0000 | MEDICATED_PATCH | TRANSDERMAL | 0 refills | Status: DC

## 2024-01-12 ENCOUNTER — Other Ambulatory Visit: Payer: Self-pay | Admitting: Internal Medicine

## 2024-01-12 DIAGNOSIS — E118 Type 2 diabetes mellitus with unspecified complications: Secondary | ICD-10-CM

## 2024-01-20 ENCOUNTER — Telehealth: Payer: Self-pay

## 2024-01-20 ENCOUNTER — Other Ambulatory Visit (HOSPITAL_COMMUNITY): Payer: Self-pay

## 2024-01-20 ENCOUNTER — Ambulatory Visit: Admitting: Internal Medicine

## 2024-01-20 VITALS — BP 128/76 | HR 67 | Temp 97.8°F | Resp 16 | Ht 59.0 in | Wt 129.2 lb

## 2024-01-20 DIAGNOSIS — Z0000 Encounter for general adult medical examination without abnormal findings: Secondary | ICD-10-CM

## 2024-01-20 DIAGNOSIS — N1831 Chronic kidney disease, stage 3a: Secondary | ICD-10-CM | POA: Diagnosis not present

## 2024-01-20 DIAGNOSIS — E876 Hypokalemia: Secondary | ICD-10-CM

## 2024-01-20 DIAGNOSIS — Z0001 Encounter for general adult medical examination with abnormal findings: Secondary | ICD-10-CM

## 2024-01-20 DIAGNOSIS — E118 Type 2 diabetes mellitus with unspecified complications: Secondary | ICD-10-CM

## 2024-01-20 DIAGNOSIS — K760 Fatty (change of) liver, not elsewhere classified: Secondary | ICD-10-CM

## 2024-01-20 DIAGNOSIS — I10 Essential (primary) hypertension: Secondary | ICD-10-CM

## 2024-01-20 DIAGNOSIS — K2100 Gastro-esophageal reflux disease with esophagitis, without bleeding: Secondary | ICD-10-CM | POA: Diagnosis not present

## 2024-01-20 DIAGNOSIS — E1122 Type 2 diabetes mellitus with diabetic chronic kidney disease: Secondary | ICD-10-CM | POA: Diagnosis not present

## 2024-01-20 LAB — BASIC METABOLIC PANEL WITH GFR
BUN: 19 mg/dL (ref 6–23)
CO2: 30 meq/L (ref 19–32)
Calcium: 10.5 mg/dL (ref 8.4–10.5)
Chloride: 101 meq/L (ref 96–112)
Creatinine, Ser: 1.06 mg/dL (ref 0.40–1.20)
GFR: 54.89 mL/min — ABNORMAL LOW
Glucose, Bld: 84 mg/dL (ref 70–99)
Potassium: 4.9 meq/L (ref 3.5–5.1)
Sodium: 139 meq/L (ref 135–145)

## 2024-01-20 LAB — CBC WITH DIFFERENTIAL/PLATELET
Basophils Absolute: 0.1 K/uL (ref 0.0–0.1)
Basophils Relative: 1.1 % (ref 0.0–3.0)
Eosinophils Absolute: 0.3 K/uL (ref 0.0–0.7)
Eosinophils Relative: 4.5 % (ref 0.0–5.0)
HCT: 43.2 % (ref 36.0–46.0)
Hemoglobin: 13.8 g/dL (ref 12.0–15.0)
Lymphocytes Relative: 51.5 % — ABNORMAL HIGH (ref 12.0–46.0)
Lymphs Abs: 3 K/uL (ref 0.7–4.0)
MCHC: 32 g/dL (ref 30.0–36.0)
MCV: 88.4 fl (ref 78.0–100.0)
Monocytes Absolute: 0.5 K/uL (ref 0.1–1.0)
Monocytes Relative: 7.8 % (ref 3.0–12.0)
Neutro Abs: 2 K/uL (ref 1.4–7.7)
Neutrophils Relative %: 35.1 % — ABNORMAL LOW (ref 43.0–77.0)
Platelets: 215 K/uL (ref 150.0–400.0)
RBC: 4.89 Mil/uL (ref 3.87–5.11)
RDW: 14.6 % (ref 11.5–15.5)
WBC: 5.8 K/uL (ref 4.0–10.5)

## 2024-01-20 LAB — MICROALBUMIN / CREATININE URINE RATIO
Creatinine,U: 68.1 mg/dL
Microalb Creat Ratio: UNDETERMINED mg/g (ref 0.0–30.0)
Microalb, Ur: <0.7 mg/dL

## 2024-01-20 LAB — HEPATIC FUNCTION PANEL
ALT: 16 U/L (ref 3–35)
AST: 22 U/L (ref 5–37)
Albumin: 4.1 g/dL (ref 3.5–5.2)
Alkaline Phosphatase: 96 U/L (ref 39–117)
Bilirubin, Direct: 0.1 mg/dL (ref 0.1–0.3)
Total Bilirubin: 0.3 mg/dL (ref 0.2–1.2)
Total Protein: 8.7 g/dL — ABNORMAL HIGH (ref 6.0–8.3)

## 2024-01-20 LAB — PROTIME-INR
INR: 1.1 ratio — ABNORMAL HIGH (ref 0.8–1.0)
Prothrombin Time: 11.6 s (ref 9.6–13.1)

## 2024-01-20 NOTE — Patient Instructions (Signed)

## 2024-01-20 NOTE — Progress Notes (Unsigned)
 "  Subjective:  Patient ID: Angel Gordon, female    DOB: Mar 16, 1955  Age: 68 y.o. MRN: [id number]  CC: Diabetes (A1c Check ) and Annual Exam   HPI Angel Gordon presents for a CPX and f/up -  Discussed the use of AI scribe software for clinical note transcription with the patient, who gave verbal consent to proceed.  History of Present Illness Angel Gordon is a 68 year old female who presents for a routine follow-up visit.  She feels good overall and has been experiencing weight loss, which she does not consider unexpected. No nausea, vomiting, abdominal pain, diarrhea, or constipation. Her bowel movements are normal without pain.  Her blood sugar readings have been consistently low, with frequent alerts from her device.   She has a history of arthritis, particularly in her knee. She does not take any medication for pain and denies any pain in her legs when walking.  She recently had a mammogram done in November at [facility]. She has a DNR in place, which is noted in her chart and a copy is kept on her refrigerator. Her son is not happy with this decision.  No chest pain, shortness of breath, dizziness, or lightheadedness.     Outpatient Medications Prior to Visit  Medication Sig Dispense Refill   Aspirin 81 MG CAPS Take 81 mg by mouth daily.     atorvastatin  (LIPITOR) 10 MG tablet TAKE 1 TABLET DAILY 90 tablet 1   Continuous Glucose Receiver (FREESTYLE LIBRE 3 READER) DEVI 1 Act by Does not apply route daily. 1 each 3   Continuous Glucose Sensor (FREESTYLE LIBRE 3 PLUS SENSOR) MISC APPLY AND CHANGE ONE SENSOR EVERY 15 DAYS FOR CONTINOUS GLUCOSE MONITORING 6 each 1   fluticasone  (FLONASE ) 50 MCG/ACT nasal spray Place 2 sprays into both nostrils daily. 16 g 6   MOUNJARO  7.5 MG/0.5ML Pen ADMINISTER 7.5 MG UNDER THE SKIN 1 TIME A WEEK 6 mL 1   dapagliflozin  propanediol (FARXIGA ) 10 MG TABS tablet Take 1 tablet (10 mg total) by mouth daily. 90 tablet 1    Finerenone  (KERENDIA ) 20 MG TABS Take 1 tablet (20 mg total) by mouth daily. 90 tablet 1   meloxicam  (MOBIC ) 15 MG tablet TAKE 1 TABLET(15 MG) BY MOUTH DAILY AS NEEDED (Patient not taking: Reported on 10/25/2023) 90 tablet 0   oxybutynin  (DITROPAN ) 5 MG tablet Take 1 tablet (5 mg total) by mouth every 8 (eight) hours as needed for bladder spasms. (Patient not taking: Reported on 10/25/2023) 30 tablet 1   Roflumilast  (ZORYVE ) 0.15 % CREA Apply 1 Act topically daily. 60 g 2   scopolamine  (TRANSDERM-SCOP) 1 MG/3DAYS Place 1 patch (1 mg total) onto the skin every 3 (three) days. 10 patch 0   No facility-administered medications prior to visit.    ROS Review of Systems  Constitutional:  Positive for unexpected weight change (wt loss). Negative for appetite change, chills, diaphoresis and fatigue.  HENT: Negative.  Negative for trouble swallowing.   Eyes: Negative.  Negative for visual disturbance.  Respiratory: Negative.  Negative for cough, chest tightness, shortness of breath and wheezing.   Cardiovascular:  Negative for chest pain, palpitations and leg swelling.  Gastrointestinal: Negative.  Negative for abdominal pain, constipation, diarrhea, nausea and vomiting.  Endocrine: Negative.   Genitourinary: Negative.  Negative for difficulty urinating and dysuria.  Musculoskeletal: Negative.  Negative for arthralgias and myalgias.  Skin: Negative.   Neurological:  Negative for dizziness, weakness, light-headedness and headaches.  Hematological:  Negative for adenopathy. Does not bruise/bleed easily.  Psychiatric/Behavioral: Negative.      Objective:  BP 128/76 (BP Location: Left Arm, Patient Position: Sitting, Cuff Size: Normal)   Pulse 67   Temp 97.8 F (36.6 C) (Oral)   Resp 16   Ht 4' 11 (1.499 m)   Wt 129 lb 3.2 oz (58.6 kg)   LMP 02/23/2011   HC 9 (22.9 cm)   SpO2 99%   BMI 26.10 kg/m   BP Readings from Last 3 Encounters:  01/20/24 128/76  08/06/23 103/69  06/24/23 118/72     Wt Readings from Last 3 Encounters:  01/20/24 129 lb 3.2 oz (58.6 kg)  10/25/23 126 lb (57.2 kg)  08/06/23 128 lb (58.1 kg)    Physical Exam Vitals reviewed.  Constitutional:      Appearance: Normal appearance.  HENT:     Nose: Nose normal.     Mouth/Throat:     Mouth: Mucous membranes are moist.  Eyes:     General: No scleral icterus.    Conjunctiva/sclera: Conjunctivae normal.  Cardiovascular:     Rate and Rhythm: Normal rate and regular rhythm.     Pulses: Normal pulses.     Heart sounds: No murmur heard.    No friction rub. No gallop.  Pulmonary:     Effort: Pulmonary effort is normal.     Breath sounds: No stridor. No wheezing, rhonchi or rales.  Abdominal:     General: Abdomen is flat.     Palpations: There is no mass.     Tenderness: There is no abdominal tenderness. There is no guarding.     Hernia: No hernia is present.  Musculoskeletal:        General: Normal range of motion.     Cervical back: Neck supple.     Right lower leg: No edema.     Left lower leg: No edema.  Lymphadenopathy:     Cervical: No cervical adenopathy.  Skin:    General: Skin is warm and dry.  Neurological:     General: No focal deficit present.     Mental Status: She is alert.  Psychiatric:        Mood and Affect: Mood normal.        Behavior: Behavior normal.     Lab Results  Component Value Date   WBC 5.8 01/20/2024   HGB 13.8 01/20/2024   HCT 43.2 01/20/2024   PLT 215.0 01/20/2024   GLUCOSE 84 01/20/2024   CHOL 114 06/24/2023   TRIG 36.0 06/24/2023   HDL 60.30 06/24/2023   LDLCALC 47 06/24/2023   ALT 16 01/20/2024   AST 22 01/20/2024   NA 139 01/20/2024   K 4.9 01/20/2024   CL 101 01/20/2024   CREATININE 1.06 01/20/2024   BUN 19 01/20/2024   CO2 30 01/20/2024   TSH 1.88 06/24/2023   INR 1.1 (H) 01/20/2024   HGBA1C 5.8 01/20/2024   MICROALBUR <0.7 01/20/2024    CT CHEST HIGH RESOLUTION Result Date: 10/14/2023 CLINICAL DATA:  Interstitial lung disease.  EXAM: CT CHEST WITHOUT CONTRAST TECHNIQUE: Multidetector CT imaging of the chest was performed following the standard protocol without intravenous contrast. High resolution imaging of the lungs, as well as inspiratory and expiratory imaging, was performed. RADIATION DOSE REDUCTION: This exam was performed according to the departmental dose-optimization program which includes automated exposure control, adjustment of the mA and/or kV according to patient size and/or use of iterative reconstruction technique. COMPARISON:  11/07/2022.  FINDINGS: Cardiovascular: Atherosclerotic calcification of the aorta and left anterior descending coronary artery. Heart is at the upper limits of normal in size to mildly enlarged. No pericardial effusion. Mediastinum/Nodes: No pathologically enlarged mediastinal or axillary lymph nodes. Hilar regions are difficult to definitively evaluate without IV contrast. Esophagus is grossly unremarkable. Lungs/Pleura: Subpleural reticulation, coarsened ground-glass and traction bronchiolectasis without a definite zonal predominance, unchanged from 11/07/2022. Associated fissural nodularity. Stable 4 mm anterior segment left upper lobe nodule (6/51). No pleural fluid. Airway is unremarkable. There is air trapping. Upper Abdomen: Small hiatal hernia. Visualized portions of the liver, gallbladder, adrenal glands, kidneys, spleen, pancreas, stomach and bowel are otherwise grossly unremarkable. No upper abdominal adenopathy. Musculoskeletal: Degenerative changes in the spine. IMPRESSION: 1. Pulmonary parenchymal of interstitial lung disease, as detailed above, unchanged from 11/07/2022. Findings may be due to sarcoid or fibrotic hypersensitivity pneumonitis. Findings are suggestive of an alternative diagnosis (not UIP) per consensus guidelines: Diagnosis of Idiopathic Pulmonary Fibrosis: An Official ATS/ERS/JRS/ALAT Clinical Practice Guideline. Am JINNY Honey Crit Care Med Vol 198, Iss 5, [phone], Sep 22 2016. 2. Aortic atherosclerosis (ICD10-I70.0). Coronary artery calcification. Electronically Signed   By: Newell Eke M.D.   On: 10/14/2023 11:29   Fibrosis 4 Score = 1.69  Fib-4 interpretation is not validated for people under 35 or over 10 years of age. However, scores under 2.0 are generally considered low risk.   Estimated Creatinine Clearance: 40.7 mL/min (by C-G formula based on SCr of 1.06 mg/dL).   Assessment & Plan:  Type 2 diabetes mellitus with stage 3a chronic kidney disease, without long-term current use of insulin  (HCC)- Blood sugar is well controlled. -     Microalbumin / creatinine urine ratio; Future -     Hemoglobin A1c; Future -     Urinalysis, Routine w reflex microscopic; Future -     HM Diabetes Foot Exam -     Dapagliflozin  Propanediol; Take 1 tablet (10 mg total) by mouth daily.  Dispense: 90 tablet; Refill: 1 -     Kerendia ; Take 1 tablet (20 mg total) by mouth daily.  Dispense: 90 tablet; Refill: 1  Chronic kidney disease, stage 3a (HCC)- Will continue the MRA and SGLT2-inh for renal protection. -     Microalbumin / creatinine urine ratio; Future -     Urinalysis, Routine w reflex microscopic; Future  Essential hypertension - BP is well controlled. -     CBC with Differential/Platelet; Future -     Urinalysis, Routine w reflex microscopic; Future -     Basic metabolic panel with GFR; Future  Fatty liver disease, nonalcoholic -     Hepatic function panel; Future -     Protime-INR; Future  Gastroesophageal reflux disease with esophagitis without hemorrhage -     CBC with Differential/Platelet; Future  Chronic hypokalemia -     Basic metabolic panel with GFR; Future  Encounter for general adult medical examination with abnormal findings- Exam completed, labs reviewed, vaccines reviewed, cancer screenings are UTD, pt ed material was given.      Follow-up: Return in about 6 months (around 07/20/2024).  Debby Molt, MD "

## 2024-01-20 NOTE — Telephone Encounter (Signed)
 Pharmacy Patient Advocate Encounter   Received notification from Onbase that prior authorization for Kerendia  20MG  tablets  is required/requested.   Insurance verification completed.   The patient is insured through [facility] ADVANTAGE/RX ADVANCE.   *waiting on Dr to sign encounter notes

## 2024-01-21 LAB — URINALYSIS, ROUTINE W REFLEX MICROSCOPIC
Bilirubin Urine: NEGATIVE
Hgb urine dipstick: NEGATIVE
Ketones, ur: NEGATIVE
Leukocytes,Ua: NEGATIVE
Nitrite: NEGATIVE
Specific Gravity, Urine: 1.02 (ref 1.000–1.030)
Total Protein, Urine: NEGATIVE
Urine Glucose: >=1000 — AB
Urobilinogen, UA: 0.2 (ref 0.0–1.0)
pH: 5.5 (ref 5.0–8.0)

## 2024-01-21 LAB — HEMOGLOBIN A1C: Hgb A1c MFr Bld: 5.8 % (ref 4.6–6.5)

## 2024-01-21 MED ORDER — DAPAGLIFLOZIN PROPANEDIOL 10 MG PO TABS: 10.0000 mg | ORAL_TABLET | Freq: Every day | ORAL | 1 refills | Status: DC

## 2024-01-21 MED ORDER — KERENDIA 20 MG PO TABS: 1.0000 | ORAL_TABLET | Freq: Every day | ORAL | 1 refills | Status: AC

## 2024-01-21 NOTE — Telephone Encounter (Signed)
 To proceed with the prior authorization for Kerendia , we will need to attach chart notes from the most recent office visit. Once the notes are signed, we can submit the PA.

## 2024-01-21 NOTE — Telephone Encounter (Signed)
 Can you please sign her office note?

## 2024-01-22 ENCOUNTER — Ambulatory Visit: Payer: Self-pay | Admitting: Internal Medicine

## 2024-01-22 ENCOUNTER — Other Ambulatory Visit (HOSPITAL_COMMUNITY): Payer: Self-pay

## 2024-01-22 NOTE — Telephone Encounter (Signed)
 Pharmacy Patient Advocate Encounter   Received notification from Onbase that prior authorization for Kerendia  20MG  tablets  is required/requested.   Insurance verification completed.   The patient is insured through [facility] ADVANTAGE/RX ADVANCE.   Per test claim: PA required; PA submitted to above mentioned insurance via Latent Key/confirmation #/EOC [facility] Status is pending

## 2024-01-24 ENCOUNTER — Telehealth: Payer: Self-pay

## 2024-01-24 ENCOUNTER — Other Ambulatory Visit: Payer: Self-pay | Admitting: Internal Medicine

## 2024-01-24 DIAGNOSIS — N1831 Chronic kidney disease, stage 3a: Secondary | ICD-10-CM

## 2024-01-24 NOTE — Telephone Encounter (Signed)
 Copied from CRM [id number]. Topic: Clinical - Medication Prior Auth >> Jan 24, 2024  1:02 PM Brittany M wrote: Reason for CRM: Insurance Denied Kerendia  on  1/2- PA criteria was not met. Please reach out if going to do an appealGLENWOOD Gillis with Cover my Meds [phone]

## 2024-01-24 NOTE — Telephone Encounter (Signed)
 Are we appealing this ?

## 2024-01-26 ENCOUNTER — Other Ambulatory Visit: Payer: Self-pay | Admitting: Internal Medicine

## 2024-01-26 DIAGNOSIS — E785 Hyperlipidemia, unspecified: Secondary | ICD-10-CM

## 2024-01-26 MED ORDER — ATORVASTATIN CALCIUM 10 MG PO TABS: 10.0000 mg | ORAL_TABLET | Freq: Every day | ORAL | 1 refills | Status: AC

## 2024-01-26 NOTE — Telephone Encounter (Signed)
 She will have to see nephrology to have these approved. Is she willing to do that?

## 2024-01-27 ENCOUNTER — Other Ambulatory Visit (HOSPITAL_COMMUNITY): Payer: Self-pay

## 2024-01-27 ENCOUNTER — Telehealth: Payer: Self-pay

## 2024-01-27 NOTE — Telephone Encounter (Signed)
 Patient states that she has new insurance that she sent in to be added to her chart. Then she wants us  to run the PA again.

## 2024-01-27 NOTE — Telephone Encounter (Signed)
 Pharmacy Patient Advocate Encounter   Received notification from Pt Calls Messages that prior authorization for Kerendia  20mg  tabs is required/requested.   Insurance verification completed.   The patient is insured through [facility].   Per test claim: PA required; PA submitted to above mentioned insurance via Latent Key/confirmation #/[id number] Status is pending

## 2024-01-27 NOTE — Telephone Encounter (Signed)
 Pharmacy Patient Advocate Encounter  Received notification from [facility] that Prior Authorization for Kerendia  20mg  tabs has been DENIED.  See denial reason below. No denial letter attached in CMM. Will attach denial letter to Media tab once received.   PA #/Case ID/Reference #: [id number]

## 2024-01-28 NOTE — Telephone Encounter (Signed)
 Was this denial with the new insurance?

## 2024-01-29 ENCOUNTER — Other Ambulatory Visit: Payer: Self-pay

## 2024-01-29 ENCOUNTER — Other Ambulatory Visit (HOSPITAL_COMMUNITY): Payer: Self-pay

## 2024-01-29 DIAGNOSIS — N1831 Chronic kidney disease, stage 3a: Secondary | ICD-10-CM

## 2024-01-29 MED ORDER — DAPAGLIFLOZIN PROPANEDIOL 10 MG PO TABS: 10.0000 mg | ORAL_TABLET | Freq: Every day | ORAL | 1 refills | Status: AC

## 2024-01-29 NOTE — Telephone Encounter (Signed)
"  Patient has been made aware  "

## 2024-01-29 NOTE — Telephone Encounter (Signed)
 Pharmacy Patient Advocate Encounter  Received notification from [employer] that Prior Authorization for Kerendia  20MG  tablets  has been DENIED.  No reason given; No denial letter received via Fax or CMM. It has been requested and will be uploaded to the media tab once received.   PA #/Case ID/Reference #: Kerendia  20MG  tablets   *Patient changed insurance as of 01/23/24. See encounter from 01/27/24 for details.

## 2024-01-30 ENCOUNTER — Other Ambulatory Visit

## 2024-01-30 DIAGNOSIS — Z006 Encounter for examination for normal comparison and control in clinical research program: Secondary | ICD-10-CM

## 2024-02-07 ENCOUNTER — Other Ambulatory Visit: Payer: Self-pay | Admitting: Pharmacist

## 2024-02-07 ENCOUNTER — Telehealth: Payer: Self-pay

## 2024-02-07 DIAGNOSIS — N1831 Chronic kidney disease, stage 3a: Secondary | ICD-10-CM

## 2024-02-07 NOTE — Progress Notes (Signed)
 Pt is needing referral to neprhology entered since she has not seen Dr. Douglass since 2020. PCP recommended seeing renal due to insurance denying Kerendia  PA.  Darrelyn Drum, PharmD, BCPS, CPP Clinical Pharmacist Practitioner [facility] [phone]

## 2024-02-07 NOTE — Telephone Encounter (Signed)
 Copied from CRM [id number]. Topic: Clinical - Prescription Issue >> Feb 07, 2024  2:05 PM Mercedes MATSU wrote: Reason for CRM: Cover my meds called on behalf of the patient and stated that they wanted to know if they need to close out the med request, or not. Call was dropped.

## 2024-02-12 NOTE — Telephone Encounter (Signed)
 Is there anyway that one of you amazing people could figure out which request needs to be closed out ?

## 2024-02-14 ENCOUNTER — Other Ambulatory Visit: Payer: Self-pay | Admitting: Internal Medicine

## 2024-02-14 DIAGNOSIS — N1831 Chronic kidney disease, stage 3a: Secondary | ICD-10-CM

## 2024-02-14 DIAGNOSIS — E118 Type 2 diabetes mellitus with unspecified complications: Secondary | ICD-10-CM

## 2024-02-17 ENCOUNTER — Encounter: Payer: Self-pay | Admitting: Internal Medicine

## 2024-02-18 ENCOUNTER — Telehealth: Payer: Self-pay | Admitting: Pharmacist

## 2024-02-19 NOTE — Telephone Encounter (Signed)
 Assistance was requested regarding patient's CGM - she has new insurance and the cost has gone up for her [place] 3 sensors. Pharmacy told her the new insurance bills under medical.   Explained to patient that most insurance do not cover CGM if pt is not on insulin . Her previous insurer did cover CGM w/o insulin  use. She does report she has low events in the 50s. Was able to have patient share her data with me. She has had 2 events <54 in the last 2 weeks, one being this morning. This should qualify her for CGM coverage through insurance.   Ordered sensors via [employer] website to get CGM supplies through [employer]      Of note, pt is having a lot of hypoglycemia events. May need to reduce Mounjaro  dose.  Darrelyn Drum, PharmD, BCPS, CPP Clinical Pharmacist Practitioner [facility] [phone]

## 2024-02-19 NOTE — Telephone Encounter (Signed)
 Addressed in other encounter.

## 2024-02-21 LAB — GENECONNECT MOLECULAR SCREEN

## 2024-02-24 ENCOUNTER — Telehealth: Payer: Self-pay | Admitting: Medical Genetics
